# Patient Record
Sex: Male | Born: 1935 | Race: White | Hispanic: No | Marital: Married | Smoking: Current every day smoker
Health system: Southern US, Community
[De-identification: ages and names within clinical notes are randomized; demographics above are authoritative.]

## PROBLEM LIST (undated history)

## (undated) ENCOUNTER — Emergency Department (HOSPITAL_COMMUNITY): Admission: EM | Discharge status: Absent without leave | Payer: Medicare Other

## (undated) DIAGNOSIS — C259 Malignant neoplasm of pancreas, unspecified: Secondary | ICD-10-CM

## (undated) DIAGNOSIS — N400 Enlarged prostate without lower urinary tract symptoms: Secondary | ICD-10-CM

## (undated) DIAGNOSIS — C787 Secondary malignant neoplasm of liver and intrahepatic bile duct: Secondary | ICD-10-CM

## (undated) DIAGNOSIS — I10 Essential (primary) hypertension: Secondary | ICD-10-CM

## (undated) DIAGNOSIS — E785 Hyperlipidemia, unspecified: Secondary | ICD-10-CM

## (undated) DIAGNOSIS — C4490 Unspecified malignant neoplasm of skin, unspecified: Secondary | ICD-10-CM

## (undated) DIAGNOSIS — K219 Gastro-esophageal reflux disease without esophagitis: Secondary | ICD-10-CM

## (undated) DIAGNOSIS — E538 Deficiency of other specified B group vitamins: Secondary | ICD-10-CM

## (undated) DIAGNOSIS — E291 Testicular hypofunction: Secondary | ICD-10-CM

## (undated) HISTORY — DX: Benign prostatic hyperplasia without lower urinary tract symptoms: N40.0

## (undated) HISTORY — DX: Essential (primary) hypertension: I10

## (undated) HISTORY — DX: Gastro-esophageal reflux disease without esophagitis: K21.9

## (undated) HISTORY — PX: APPENDECTOMY: SHX54

## (undated) HISTORY — DX: Testicular hypofunction: E29.1

## (undated) HISTORY — DX: Unspecified malignant neoplasm of skin, unspecified: C44.90

## (undated) HISTORY — PX: EYE SURGERY: SHX253

## (undated) HISTORY — PX: TONSILLECTOMY: SHX5217

## (undated) HISTORY — DX: Hyperlipidemia, unspecified: E78.5

## (undated) HISTORY — PX: OTHER SURGICAL HISTORY: SHX169

## (undated) HISTORY — DX: Deficiency of other specified B group vitamins: E53.8

---

## 1998-08-06 ENCOUNTER — Encounter: Payer: Self-pay | Admitting: Internal Medicine

## 1998-08-06 LAB — CONVERTED CEMR LAB

## 1999-01-06 ENCOUNTER — Emergency Department (HOSPITAL_COMMUNITY): Admission: EM | Admit: 1999-01-06 | Discharge: 1999-01-06 | Payer: Self-pay

## 1999-01-14 ENCOUNTER — Emergency Department (HOSPITAL_COMMUNITY): Admission: EM | Admit: 1999-01-14 | Discharge: 1999-01-14 | Payer: Self-pay | Admitting: Internal Medicine

## 2000-11-15 ENCOUNTER — Encounter (INDEPENDENT_AMBULATORY_CARE_PROVIDER_SITE_OTHER): Payer: Self-pay | Admitting: Specialist

## 2000-11-15 ENCOUNTER — Ambulatory Visit (HOSPITAL_BASED_OUTPATIENT_CLINIC_OR_DEPARTMENT_OTHER): Admission: RE | Admit: 2000-11-15 | Discharge: 2000-11-15 | Payer: Self-pay | Admitting: Plastic Surgery

## 2002-02-07 ENCOUNTER — Emergency Department (HOSPITAL_COMMUNITY): Admission: EM | Admit: 2002-02-07 | Discharge: 2002-02-07 | Payer: Self-pay | Admitting: Emergency Medicine

## 2004-05-28 ENCOUNTER — Ambulatory Visit: Payer: Self-pay | Admitting: Internal Medicine

## 2004-06-04 ENCOUNTER — Ambulatory Visit: Payer: Self-pay | Admitting: Internal Medicine

## 2004-07-10 ENCOUNTER — Ambulatory Visit: Payer: Self-pay | Admitting: Internal Medicine

## 2004-08-19 ENCOUNTER — Ambulatory Visit: Payer: Self-pay | Admitting: Internal Medicine

## 2004-10-26 ENCOUNTER — Ambulatory Visit: Payer: Self-pay | Admitting: Internal Medicine

## 2005-05-05 ENCOUNTER — Ambulatory Visit: Payer: Self-pay | Admitting: Internal Medicine

## 2005-06-03 ENCOUNTER — Ambulatory Visit: Payer: Self-pay | Admitting: Internal Medicine

## 2005-06-08 ENCOUNTER — Ambulatory Visit: Payer: Self-pay | Admitting: Internal Medicine

## 2005-07-12 ENCOUNTER — Ambulatory Visit: Payer: Self-pay | Admitting: Internal Medicine

## 2005-12-02 ENCOUNTER — Ambulatory Visit: Payer: Self-pay | Admitting: Internal Medicine

## 2005-12-18 ENCOUNTER — Ambulatory Visit: Payer: Self-pay | Admitting: Family Medicine

## 2006-02-15 ENCOUNTER — Ambulatory Visit: Payer: Self-pay | Admitting: Internal Medicine

## 2006-03-11 ENCOUNTER — Ambulatory Visit: Payer: Self-pay | Admitting: Internal Medicine

## 2006-06-23 ENCOUNTER — Ambulatory Visit: Payer: Self-pay | Admitting: Internal Medicine

## 2006-07-28 ENCOUNTER — Ambulatory Visit: Payer: Self-pay | Admitting: Internal Medicine

## 2006-07-28 LAB — CONVERTED CEMR LAB
AST: 23 units/L (ref 0–37)
Bacteria, UA: NEGATIVE
Cholesterol: 163 mg/dL (ref 0–200)
HDL: 51.3 mg/dL (ref >39.0)
Ketones, ur: NEGATIVE mg/dL
LDL Cholesterol: 91 mg/dL (ref 0–99)
Total CHOL/HDL Ratio: 3.2
Total Protein, Urine: NEGATIVE mg/dL
Triglycerides: 106 mg/dL (ref 0–149)
VLDL: 21 mg/dL (ref 0–40)

## 2006-09-07 ENCOUNTER — Ambulatory Visit: Payer: Self-pay | Admitting: Internal Medicine

## 2006-12-26 ENCOUNTER — Encounter: Payer: Self-pay | Admitting: Internal Medicine

## 2006-12-26 DIAGNOSIS — E785 Hyperlipidemia, unspecified: Secondary | ICD-10-CM

## 2006-12-26 DIAGNOSIS — I10 Essential (primary) hypertension: Secondary | ICD-10-CM

## 2006-12-26 DIAGNOSIS — N400 Enlarged prostate without lower urinary tract symptoms: Secondary | ICD-10-CM

## 2007-01-19 ENCOUNTER — Ambulatory Visit: Payer: Self-pay | Admitting: Internal Medicine

## 2007-02-07 ENCOUNTER — Encounter: Payer: Self-pay | Admitting: Internal Medicine

## 2007-02-07 ENCOUNTER — Ambulatory Visit: Payer: Self-pay | Admitting: Internal Medicine

## 2007-02-07 DIAGNOSIS — R05 Cough: Secondary | ICD-10-CM

## 2007-05-16 ENCOUNTER — Ambulatory Visit: Payer: Self-pay | Admitting: Internal Medicine

## 2007-09-22 ENCOUNTER — Encounter: Payer: Self-pay | Admitting: Internal Medicine

## 2007-11-20 ENCOUNTER — Ambulatory Visit: Payer: Self-pay | Admitting: Internal Medicine

## 2007-12-20 ENCOUNTER — Ambulatory Visit: Payer: Self-pay | Admitting: Internal Medicine

## 2007-12-20 LAB — CONVERTED CEMR LAB
Albumin: 4.1 g/dL (ref 3.5–5.2)
BUN: 11 mg/dL (ref 6–23)
Bilirubin Urine: NEGATIVE
Calcium: 9.6 mg/dL (ref 8.4–10.5)
Chloride: 105 meq/L (ref 96–112)
Cholesterol: 279 mg/dL (ref 0–200)
Creatinine, Ser: 1 mg/dL (ref 0.4–1.5)
Direct LDL: 181.3 mg/dL
Eosinophils Relative: 3.4 % (ref 0.0–5.0)
GFR calc Af Amer: 95 mL/min
HCT: 42.2 % (ref 39.0–52.0)
HDL: 49.5 mg/dL (ref >39.0)
Ketones, ur: NEGATIVE mg/dL
Leukocytes, UA: NEGATIVE
Lymphocytes Relative: 22.1 % (ref 12.0–46.0)
Monocytes Relative: 9 % (ref 3.0–12.0)
Neutro Abs: 5.8 10*3/uL (ref 1.4–7.7)
PSA: 1.25 ng/mL (ref 0.10–4.00)
Platelets: 166 10*3/uL (ref 150–400)
RDW: 12.5 % (ref 11.5–14.6)
Sodium: 140 meq/L (ref 135–145)
TSH: 1.84 microintl units/mL (ref 0.35–5.50)
Total CHOL/HDL Ratio: 5.6
Triglycerides: 175 mg/dL — ABNORMAL HIGH (ref 0–149)
Urobilinogen, UA: 0.2 (ref 0.0–1.0)
WBC: 9 10*3/uL (ref 4.5–10.5)
pH: 6.5 (ref 5.0–8.0)

## 2007-12-21 ENCOUNTER — Ambulatory Visit: Payer: Self-pay | Admitting: Internal Medicine

## 2008-01-18 ENCOUNTER — Ambulatory Visit: Payer: Self-pay | Admitting: Internal Medicine

## 2008-02-23 ENCOUNTER — Ambulatory Visit: Payer: Self-pay | Admitting: Internal Medicine

## 2008-04-17 ENCOUNTER — Encounter: Payer: Self-pay | Admitting: Internal Medicine

## 2008-04-22 ENCOUNTER — Encounter: Payer: Self-pay | Admitting: Internal Medicine

## 2008-05-09 ENCOUNTER — Ambulatory Visit: Payer: Self-pay | Admitting: Internal Medicine

## 2008-05-09 DIAGNOSIS — J1089 Influenza due to other identified influenza virus with other manifestations: Secondary | ICD-10-CM | POA: Insufficient documentation

## 2008-05-09 LAB — CONVERTED CEMR LAB
Inflenza A Ag: NEGATIVE
Influenza B Ag: NEGATIVE

## 2008-05-13 ENCOUNTER — Telehealth: Payer: Self-pay | Admitting: Internal Medicine

## 2008-05-15 ENCOUNTER — Telehealth: Payer: Self-pay | Admitting: Internal Medicine

## 2008-05-20 ENCOUNTER — Ambulatory Visit: Payer: Self-pay | Admitting: Internal Medicine

## 2008-09-09 ENCOUNTER — Telehealth: Payer: Self-pay | Admitting: Internal Medicine

## 2008-09-10 ENCOUNTER — Telehealth: Payer: Self-pay | Admitting: Internal Medicine

## 2008-11-05 ENCOUNTER — Telehealth: Payer: Self-pay | Admitting: Internal Medicine

## 2008-12-24 ENCOUNTER — Ambulatory Visit: Payer: Self-pay | Admitting: Internal Medicine

## 2008-12-24 LAB — CONVERTED CEMR LAB
AST: 20 units/L (ref 0–37)
Cholesterol: 191 mg/dL (ref 0–200)
HDL: 42 mg/dL (ref >39.00)
LDL Cholesterol: 120 mg/dL — ABNORMAL HIGH (ref 0–99)
PSA: 1.5 ng/mL (ref 0.10–4.00)
Potassium: 4.1 meq/L (ref 3.5–5.1)
Sodium: 141 meq/L (ref 135–145)
TSH: 1.89 microintl units/mL (ref 0.35–5.50)
Triglycerides: 146 mg/dL (ref 0.0–149.0)
VLDL: 29.2 mg/dL (ref 0.0–40.0)

## 2009-01-23 ENCOUNTER — Telehealth: Payer: Self-pay | Admitting: Internal Medicine

## 2009-01-27 ENCOUNTER — Telehealth: Payer: Self-pay | Admitting: Internal Medicine

## 2009-01-27 ENCOUNTER — Ambulatory Visit: Payer: Self-pay | Admitting: Internal Medicine

## 2009-01-27 ENCOUNTER — Ambulatory Visit (HOSPITAL_COMMUNITY): Admission: RE | Admit: 2009-01-27 | Discharge: 2009-01-27 | Payer: Self-pay | Admitting: Internal Medicine

## 2009-03-04 ENCOUNTER — Ambulatory Visit: Payer: Self-pay | Admitting: Internal Medicine

## 2009-04-22 ENCOUNTER — Encounter: Payer: Self-pay | Admitting: Internal Medicine

## 2009-05-06 ENCOUNTER — Telehealth: Payer: Self-pay | Admitting: Internal Medicine

## 2009-07-25 ENCOUNTER — Encounter: Payer: Self-pay | Admitting: Internal Medicine

## 2009-09-03 ENCOUNTER — Telehealth: Payer: Self-pay | Admitting: Internal Medicine

## 2009-09-17 ENCOUNTER — Encounter: Payer: Self-pay | Admitting: Internal Medicine

## 2009-09-18 ENCOUNTER — Telehealth: Payer: Self-pay | Admitting: Internal Medicine

## 2009-10-13 ENCOUNTER — Ambulatory Visit: Payer: Self-pay | Admitting: Internal Medicine

## 2009-10-13 DIAGNOSIS — S6720XA Crushing injury of unspecified hand, initial encounter: Secondary | ICD-10-CM | POA: Insufficient documentation

## 2009-10-13 DIAGNOSIS — L02519 Cutaneous abscess of unspecified hand: Secondary | ICD-10-CM

## 2009-10-13 DIAGNOSIS — L03119 Cellulitis of unspecified part of limb: Secondary | ICD-10-CM

## 2009-11-14 ENCOUNTER — Telehealth: Payer: Self-pay | Admitting: Internal Medicine

## 2009-11-20 ENCOUNTER — Ambulatory Visit: Payer: Self-pay | Admitting: Internal Medicine

## 2009-11-20 DIAGNOSIS — E291 Testicular hypofunction: Secondary | ICD-10-CM

## 2009-11-20 DIAGNOSIS — G609 Hereditary and idiopathic neuropathy, unspecified: Secondary | ICD-10-CM | POA: Insufficient documentation

## 2009-11-20 LAB — CONVERTED CEMR LAB
Basophils Relative: 0.7 % (ref 0.0–3.0)
Bilirubin, Direct: 0.2 mg/dL (ref 0.0–0.3)
Direct LDL: 184.7 mg/dL
Eosinophils Absolute: 0.6 10*3/uL (ref 0.0–0.7)
Eosinophils Relative: 6.7 % — ABNORMAL HIGH (ref 0.0–5.0)
Folate: 19 ng/mL
HDL: 66.9 mg/dL (ref >39.00)
Hemoglobin: 15.2 g/dL (ref 13.0–17.0)
Lymphocytes Relative: 25.7 % (ref 12.0–46.0)
MCHC: 35.1 g/dL (ref 30.0–36.0)
Neutro Abs: 4.8 10*3/uL (ref 1.4–7.7)
RBC: 4.61 M/uL (ref 4.22–5.81)
Testosterone: 358.05 ng/dL (ref 350.00–890.00)
Total Bilirubin: 1.2 mg/dL (ref 0.3–1.2)
Total CHOL/HDL Ratio: 5
Triglycerides: 236 mg/dL — ABNORMAL HIGH (ref 0.0–149.0)
VLDL: 47.2 mg/dL — ABNORMAL HIGH (ref 0.0–40.0)
WBC: 8.3 10*3/uL (ref 4.5–10.5)

## 2009-11-21 ENCOUNTER — Telehealth: Payer: Self-pay | Admitting: Internal Medicine

## 2009-11-24 ENCOUNTER — Ambulatory Visit: Payer: Self-pay | Admitting: Internal Medicine

## 2009-11-24 DIAGNOSIS — E538 Deficiency of other specified B group vitamins: Secondary | ICD-10-CM | POA: Insufficient documentation

## 2009-12-19 ENCOUNTER — Ambulatory Visit: Payer: Self-pay | Admitting: Internal Medicine

## 2009-12-29 ENCOUNTER — Ambulatory Visit: Payer: Self-pay | Admitting: Internal Medicine

## 2009-12-29 LAB — CONVERTED CEMR LAB
ALT: 23 units/L (ref 0–53)
AST: 24 units/L (ref 0–37)
Calcium: 9.1 mg/dL (ref 8.4–10.5)
Cholesterol: 220 mg/dL — ABNORMAL HIGH (ref 0–200)
Direct LDL: 127.5 mg/dL
GFR calc non Af Amer: 72.61 mL/min (ref >60)
Glucose, Bld: 115 mg/dL — ABNORMAL HIGH (ref 70–99)
HDL: 52.6 mg/dL (ref >39.00)
Potassium: 3.8 meq/L (ref 3.5–5.1)
Sodium: 132 meq/L — ABNORMAL LOW (ref 135–145)
TSH: 1.78 microintl units/mL (ref 0.35–5.50)
Testosterone-% Free: 2.7 % (ref 1.6–2.9)
Testosterone: 1422.22 ng/dL — ABNORMAL HIGH (ref 350–890)
Total Protein: 7 g/dL (ref 6.0–8.3)
Triglycerides: 295 mg/dL — ABNORMAL HIGH (ref 0.0–149.0)
VLDL: 59 mg/dL — ABNORMAL HIGH (ref 0.0–40.0)

## 2009-12-30 ENCOUNTER — Ambulatory Visit: Payer: Self-pay | Admitting: Internal Medicine

## 2010-01-02 ENCOUNTER — Ambulatory Visit: Payer: Self-pay | Admitting: Internal Medicine

## 2010-01-07 ENCOUNTER — Telehealth: Payer: Self-pay | Admitting: Internal Medicine

## 2010-01-19 ENCOUNTER — Ambulatory Visit: Payer: Self-pay | Admitting: Internal Medicine

## 2010-01-29 ENCOUNTER — Encounter: Payer: Self-pay | Admitting: Internal Medicine

## 2010-02-02 ENCOUNTER — Ambulatory Visit: Payer: Self-pay | Admitting: Internal Medicine

## 2010-02-18 ENCOUNTER — Ambulatory Visit: Payer: Self-pay | Admitting: Internal Medicine

## 2010-03-06 ENCOUNTER — Ambulatory Visit: Payer: Self-pay | Admitting: Internal Medicine

## 2010-03-12 ENCOUNTER — Ambulatory Visit: Payer: Self-pay | Admitting: Internal Medicine

## 2010-04-06 ENCOUNTER — Ambulatory Visit: Payer: Self-pay | Admitting: Internal Medicine

## 2010-05-04 ENCOUNTER — Ambulatory Visit: Payer: Self-pay | Admitting: Internal Medicine

## 2010-05-25 ENCOUNTER — Telehealth: Payer: Self-pay | Admitting: Internal Medicine

## 2010-05-27 ENCOUNTER — Ambulatory Visit: Payer: Self-pay | Admitting: Internal Medicine

## 2010-05-27 ENCOUNTER — Telehealth: Payer: Self-pay | Admitting: Internal Medicine

## 2010-05-27 DIAGNOSIS — J069 Acute upper respiratory infection, unspecified: Secondary | ICD-10-CM | POA: Insufficient documentation

## 2010-05-28 ENCOUNTER — Telehealth: Payer: Self-pay | Admitting: Internal Medicine

## 2010-06-03 ENCOUNTER — Ambulatory Visit
Admission: RE | Admit: 2010-06-03 | Discharge: 2010-06-03 | Payer: Self-pay | Source: Home / Self Care | Attending: Internal Medicine | Admitting: Internal Medicine

## 2010-06-23 ENCOUNTER — Ambulatory Visit
Admission: RE | Admit: 2010-06-23 | Discharge: 2010-06-23 | Payer: Self-pay | Source: Home / Self Care | Attending: Internal Medicine | Admitting: Internal Medicine

## 2010-07-07 NOTE — Assessment & Plan Note (Signed)
Summary: B12 SHOT/MEN/CD   Nurse Visit   Allergies: No Known Drug Allergies  Medication Administration  Injection # 1:    Medication: Vit B12 1000 mcg    Diagnosis: VITAMIN B12 DEFICIENCY (ICD-266.2)    Route: IM    Site: R deltoid    Exp Date: 01/06/2012    Lot #: [id number]    Mfr: [employer]    Patient tolerated injection without complications    Given by: Jarome Lamas (April 06, 2010 1:25 PM)  Orders Added: 1)  Admin of Therapeutic Inj  intramuscular or subcutaneous [96372] 2)  Vit B12 1000 mcg [J3420]

## 2010-07-07 NOTE — Assessment & Plan Note (Signed)
Summary: PER PT 2 WK B12  MEN  STC  Nurse Visit   Allergies: No Known Drug Allergies  Medication Administration  Injection # 1:    Medication: Vit B12 1000 mcg    Diagnosis: VITAMIN B12 DEFICIENCY (ICD-266.2)    Route: IM    Site: R deltoid    Exp Date: 01/06/2012    Lot #: [id number]    Mfr: [employer]    Patient tolerated injection without complications    Given by: Lamar Sprinkles, CMA (February 02, 2010 2:22 PM)  Orders Added: 1)  Admin of Therapeutic Inj  intramuscular or subcutaneous [96372] 2)  Vit B12 1000 mcg [J3420]   Medication Administration  Injection # 1:    Medication: Vit B12 1000 mcg    Diagnosis: VITAMIN B12 DEFICIENCY (ICD-266.2)    Route: IM    Site: R deltoid    Exp Date: 01/06/2012    Lot #: [id number]    Mfr: [employer]    Patient tolerated injection without complications    Given by: Lamar Sprinkles, CMA (February 02, 2010 2:22 PM)  Orders Added: 1)  Admin of Therapeutic Inj  intramuscular or subcutaneous [96372] 2)  Vit B12 1000 mcg [J3420]

## 2010-07-07 NOTE — Assessment & Plan Note (Signed)
Summary: CPX - SD   Vital Signs:  Patient profile:   77 year old male Height:      68 inches Weight:      170 pounds BMI:     25.94 Temp:     98.3 degrees F oral Pulse rate:   60 / minute Pulse rhythm:   regular BP sitting:   138 / 88  (left arm)  Vitals Entered By: Bill Salinas, CMA (December 30, 2009 9:57 AM) CC: CPX   Primary Care Provider:  Money Mckeithan  CC:  CPX.  History of Present Illness: Patient presents for routine medical followup. since his last visit he has been walking and watching his diet. He has lost some weight and generally feels better. He has no active medical complaints at today's visit.   Allergies: No Known Drug Allergies  Past History:  Past Medical History: Last updated: 11/20/2007 Hypertension skin cancer hyperlipidemia  Past Surgical History: Last updated: 12/26/2006 Appendectomy Tonsillectomy  Family History: Last updated: 01-13-08 Father died at age 91 with CHF Mother died at age 80 A brother died at age 27 of heart disease The patient has 3 older sisters There is no family history for colon cancer Brother with terminal cancer  Social History: Last updated: 01-13-2008 Graduate of [employer] [place]  Married 47 years - stable marriage. Son age 56 living at home  He has 2 sons and 2 daughters Work: retired from Airline pilot with [employer]  Risk Factors: Smoking Status: current (10/13/2009) Cigars/wk: several a week (10/13/2009) Passive Smoke Exposure: no (10/13/2009)  Review of Systems  The patient denies anorexia, fever, weight loss, weight gain, vision loss, decreased hearing, hoarseness, chest pain, syncope, dyspnea on exertion, peripheral edema, prolonged cough, headaches, hemoptysis, abdominal pain, severe indigestion/heartburn, incontinence, suspicious skin lesions, difficulty walking, depression, unusual weight change, enlarged lymph nodes, angioedema, and testicular masses.    Physical Exam  General:  WNWD white male in no  distress Head:  normocephalic and atraumatic.   Eyes:  No corneal or conjunctival inflammation noted. EOMI. Perrla. Funduscopic exam benign, without hemorrhages, exudates or papilledema. Vision grossly normal. Ears:  R ear normal and L ear normal.   Nose:  no external deformity and no external erythema.   Mouth:  good dentition, no gingival abnormalities, no dental plaque, and pharynx pink and moist.   Neck:  supple, full ROM, no thyromegaly, no JVD, and no carotid bruits.   Chest Wall:  No deformities, masses, tenderness or gynecomastia noted. Lungs:  Normal respiratory effort, chest expands symmetrically. Lungs are clear to auscultation, no crackles or wheezes. Heart:  Normal rate and regular rhythm. S1 and S2 normal without gallop, murmur, click, rub or other extra sounds. Abdomen:  soft, non-tender, normal bowel sounds, no guarding, and no hepatomegaly.   Prostate:  deferred to GU Msk:  normal ROM, no joint tenderness, no joint swelling, no joint warmth, and no joint deformities.   Pulses:  2+ radial and DP pulses Extremities:  No clubbing, cyanosis, edema, or deformity noted with normal full range of motion of all joints.   Neurologic:  alert & oriented X3, cranial nerves II-XII intact, strength normal in all extremities, gait normal, and DTRs symmetrical and normal.   Skin:  turgor normal, color normal, no rashes, and no suspicious lesions.   Cervical Nodes:  no anterior cervical adenopathy and no posterior cervical adenopathy.   Psych:  Oriented X3, memory intact for recent and remote, normally interactive, and good eye contact.     Impression &  Recommendations:  Problem # 1:  VITAMIN B12 DEFICIENCY (ICD-266.2) ON replacement. B12 level is low normal range.  Plan - continue replacement  Problem # 2:  HYPOGONADISM (ICD-257.2) Receiving injection therapy. Testosterone level is supra-therapeutic  Problem # 3:  PERIPHERAL NEUROPATHY (ICD-356.9) Stable  Problem # 4:  HYPERTENSION  (ICD-401.9)  His updated medication list for this problem includes:    Toprol Xl 50 Mg Xr24h-tab (Metoprolol succinate) .Marland Kitchen... 1 by mouth once daily    Hydrochlorothiazide 25 Mg Tabs (Hydrochlorothiazide) .Marland Kitchen... Take 1/2 tablet by mouth once a day  BP today: 138/88 Prior BP: 160/82 (11/20/2009)  Labs Reviewed: K+: 3.8 (12/29/2009) Creat: : 1.1 (12/29/2009)     Good control. on present medication.  Problem # 5:  BENIGN PROSTATIC HYPERTROPHY, HX OF (ICD-V13.8) Per Dr. Annabell Howells  Problem # 6:  HYPERLIPIDEMIA (ICD-272.4)  His updated medication list for this problem includes:    Lovastatin 40 Mg Tabs (Lovastatin) .Marland Kitchen... Take 1 tab by mouth at bedtime    Zetia 10 Mg Tabs (Ezetimibe) ..... One once daily  Labs Reviewed: SGOT: 24 (12/29/2009)   SGPT: 23 (12/29/2009)   HDL:52.60 (12/29/2009), 66.90 (11/20/2009)  LDL:120 (12/24/2008), DEL (12/20/2007)  Chol:220 (12/29/2009), 305 (11/20/2009)  Trig:295.0 (12/29/2009), 236.0 (11/20/2009)  Adequate control on present medication.   Problem # 7:  Preventive Health Care (ICD-V70.0) Normal exam. Lab results except for triglycerides were normal. PSA was 2.0 up from 1.5 1 year ago = within normal accerlation guidelines.  Last colonoscopy '04. Immunizations are up to date: tetnus '09, pneumovax '09 and zostavax today.  He is not depressed, He is independent in ADLs. He has no increased fall or injury risk.   In summary - nice man who appears to be medically stable at this time. He will continue all his present meds, his exercise program and he is encourage to completely abstain from all nicotine.  He will return as needed or 6 months.   Complete Medication List: 1)  Baby Aspirin 81 Mg Chew (Aspirin) .... Once daily 2)  Lovastatin 40 Mg Tabs (Lovastatin) .... Take 1 tab by mouth at bedtime 3)  Toprol Xl 50 Mg Xr24h-tab (Metoprolol succinate) .Marland Kitchen.. 1 by mouth once daily 4)  Zetia 10 Mg Tabs (Ezetimibe) .... One once daily 5)  Viagra 50 Mg Tabs  (Sildenafil citrate) .... As needed 6)  Loratadine 10 Mg Tabs (Loratadine) .... Take 1 tablet by mouth once a day 7)  Kls Omperazole 20 Mg Tbec (Omeprazole) .Marland Kitchen.. 1 qam 8)  Anaprox Ds 550 Mg Tabs (Naproxen sodium) .Marland Kitchen.. 1 by mouth once daily 9)  Hydrochlorothiazide 25 Mg Tabs (Hydrochlorothiazide) .... Take 1/2 tablet by mouth once a day 10)  Finasteride 5 Mg Tabs (Finasteride) .... Take 1 tablet by mouth once a day 11)  Levitra 10 Mg Tabs (Vardenafil hcl) .Marland Kitchen.. 1 by mouth as needed 12)  Amitriptyline Hcl 25 Mg Tabs (Amitriptyline hcl) .Marland Kitchen.. 1 by mouth at bedtime  Other Orders: Subsequent annual wellness visit with prevention plan [phone]) Zoster (Shingles) Vaccine Live [phone]) Admin 1st Vaccine (09811)  Patient: Austin Torres Note: All result statuses are Final unless otherwise noted.  Tests: (1) Lipid Panel (LIPID)   Cholesterol          [H]  220 mg/dL                   9-147     ATP III Classification            Desirable:  < 200 mg/dL  Borderline High:  200 - 239 mg/dL               High:  > = 240 mg/dL   Triglycerides        [H]  295.0 mg/dL                 4.2-595.6     Normal:  <150 mg/dL     Borderline High:  387 - 199 mg/dL   HDL                       56.43 mg/dL                 >32.95   VLDL Cholesterol     [H]  59.0 mg/dL                  1.8-84.1  CHO/HDL Ratio:  CHD Risk                             4                    Men          Women     1/2 Average Risk     3.4          3.3     Average Risk          5.0          4.4     2X Average Risk          9.6          7.1     3X Average Risk          15.0          11.0                           Tests: (2) B12 Serum - Total ONLY (B12)   Vitamin B12               232 pg/mL                   211-911  Tests: (3) Hepatic/Liver Function Panel (HEPATIC)   Total Bilirubin      [H]  1.5 mg/dL                   6.6-0.6   Direct Bilirubin          0.2 mg/dL                   3.0-1.6   Alkaline Phosphatase      49  U/L                      39-117   AST                       24 U/L                      0-37   ALT                       23 U/L                      0-53   Total Protein  7.0 g/dL                    1.6-1.0   Albumin                   4.4 g/dL                    9.6-0.4  Tests: (4) BMP (METABOL)   Sodium               [L]  132 mEq/L                   135-145   Potassium                 3.8 mEq/L                   3.5-5.1   Chloride                  96 mEq/L                    96-112   Carbon Dioxide            27 mEq/L                    19-32   Glucose              [H]  115 mg/dL                   54-09   BUN                       22 mg/dL                    8-11   Creatinine                1.1 mg/dL                   9.1-4.7   Calcium                   9.1 mg/dL                   8.2-95.6   GFR                       72.61 mL/min                >60  Tests: (5) TSH (TSH)   FastTSH                   1.78 uIU/mL                 0.35-5.50  Tests: (6) Prostate Specific Antigen (PSA)   PSA-Hyb                   2.00 ng/mL                  0.10-4.00  Tests: (7) Cholesterol LDL - Direct (DIRLDL)  Cholesterol LDL - Direct                             127.5 mg/dL  Tests: (1) Testosterone, Free and Total (includes SHBG) (3230)   Testosterone, Total  [H]  1422.22 ng/dL  25-890                Tanner Stage       Male              Male              I              < 30 ng/dL        < 10 ng/dL              II             < 150 ng/dL       < 30 ng/dL              III            100-320 ng/dL     < 35 ng/dL              IV             200-970 ng/dL     65-78 ng/dL              V              350-890 ng/dL     46-96 ng/dL         Sex Hormone Binding Globulin                             32 nmol/L                   13-71   Testosterone, Free   [H]  382.1 pg/mL                 47.0-244.0  Immunizations Administered:  Zostavax # 1:    Vaccine Type: Zostavax    Site:  left arm    Mfr: Merck    Dose: 0.5 ml    Route: [place]    Given by: Ami Bullins CMA    Exp. Date: 11/14/2010    Lot #: [id number]    VIS given: 03/19/05 given December 30, 2009.

## 2010-07-07 NOTE — Assessment & Plan Note (Signed)
Summary: [name] Austin Torres  Nurse Visit   Allergies: No Known Drug Allergies  Medication Administration  Injection # 1:    Medication: Vit B12 1000 mcg    Diagnosis: VITAMIN B12 DEFICIENCY (ICD-266.2)    Route: IM    Site: R deltoid    Exp Date: 11/06/2011    Lot #: [id number]    Mfr: [employer] Regent    Patient tolerated injection without complications    Given by: Lamar Sprinkles, CMA (March 06, 2010 9:49 AM)  Injection # 2:    Medication: Testosterone Cypionat 200mg  ing    Diagnosis: HYPOGONADISM (ICD-257.2)    Route: IM    Site: RUOQ gluteus    Exp Date: 02/06/2011    Lot #: [id number]    Patient tolerated injection without complications    Given by: Lamar Sprinkles, CMA (March 06, 2010 9:51 AM)  Orders Added: 1)  Vit B12 1000 mcg [J3420] 2)  Admin of Therapeutic Inj  intramuscular or subcutaneous [96372] 3)  Admin of Therapeutic Inj  intramuscular or subcutaneous [62130]

## 2010-07-07 NOTE — Assessment & Plan Note (Signed)
Summary: dog bite/right hand/norins-lb   Vital Signs:  Patient profile:   77 year old male Height:      68 inches Weight:      176.75 pounds BMI:     26.97 O2 Sat:      96 % on Room air Temp:     98.6 degrees F oral Pulse rate:   61 / minute Pulse rhythm:   regular Resp:     16 per minute BP sitting:   142 / 78  (left arm) Cuff size:   large  Vitals Entered By: Rock Nephew CMA (Oct 13, 2009 10:47 AM)  Nutrition Counseling: Patient's BMI is greater than 25 and therefore counseled on weight management options.  O2 Flow:  Room air CC: Dog bite on R hand w/ swelling   Primary Care Provider:  norins  CC:  Dog bite on R hand w/ swelling.  History of Present Illness: New to me c/o dog bite on right hand 2 days ago. He was sitting at a restaurant when a freind's fully-vaccinated puppy was playing and it nipped his right hand. He has a painful PW on the dorsum of his hand over the proximal aspect pf the 2nd metacarpal and an adjacent abrasion. He does not have any swelling or streaking.  Preventive Screening-Counseling & Management  Alcohol-Tobacco     Alcohol drinks/day: 2     Alcohol type: wine     Smoking Status: current     Smoking Cessation Counseling: yes     Cigars/week: several a week     Passive Smoke Exposure: no  Hep-HIV-STD-Contraception     Hepatitis Risk: no risk noted     HIV Risk: no     STD Risk: no risk noted      Sexual History:  currently monogamous.        Drug Use:  never.        Blood Transfusions:  no.    Medications Prior to Update: 1)  Baby Aspirin 81 Mg  Chew (Aspirin) .... Once Daily 2)  Lovastatin 40 Mg  Tabs (Lovastatin) .... Take 1 Tab By Mouth At Bedtime 3)  Toprol Xl 50 Mg  Xr24h-Tab (Metoprolol Succinate) .Marland Kitchen.. 1 By Mouth Once Daily 4)  Zetia 10 Mg  Tabs (Ezetimibe) .... One Once Daily 5)  Viagra 50 Mg  Tabs (Sildenafil Citrate) .... As Needed 6)  Loratadine 10 Mg  Tabs (Loratadine) .... Take 1 Tablet By Mouth Once A Day 7)  Kls  Omperazole 20 Mg  Tbec (Omeprazole) .Marland Kitchen.. 1 Qam 8)  Anaprox Ds 550 Mg  Tabs (Naproxen Sodium) .Marland Kitchen.. 1 By Mouth Once Daily 9)  Hydrochlorothiazide 25 Mg  Tabs (Hydrochlorothiazide) .... Take 1/2 Tablet By Mouth Once A Day 10)  Finasteride 5 Mg  Tabs (Finasteride) .... Take 1 Tablet By Mouth Once A Day 11)  Lomotil 2.5-0.025 Mg Tabs (Diphenoxylate-Atropine) .Marland Kitchen.. 1 After Each Loose Stool, Limit of 4 Doses/24hrs. 12)  Promethazine Hcl 12.5 Mg Tabs (Promethazine Hcl) .Marland Kitchen.. 1 Every Six Hours As Needed For Nausea. 13)  Levitra 10 Mg Tabs (Vardenafil Hcl) .Marland Kitchen.. 1 By Mouth As Needed 14)  Dicyclomine Hcl 10 Mg Caps (Dicyclomine Hcl) .Marland Kitchen.. 1 By Mouth Q 6 As Needed Bloating,gas and Discomfort  Current Medications (verified): 1)  Baby Aspirin 81 Mg  Chew (Aspirin) .... Once Daily 2)  Lovastatin 40 Mg  Tabs (Lovastatin) .... Take 1 Tab By Mouth At Bedtime 3)  Toprol Xl 50 Mg  Xr24h-Tab (Metoprolol Succinate) .Marland KitchenMarland KitchenMarland Kitchen  1 By Mouth Once Daily 4)  Zetia 10 Mg  Tabs (Ezetimibe) .... One Once Daily 5)  Viagra 50 Mg  Tabs (Sildenafil Citrate) .... As Needed 6)  Loratadine 10 Mg  Tabs (Loratadine) .... Take 1 Tablet By Mouth Once A Day 7)  Kls Omperazole 20 Mg  Tbec (Omeprazole) .Marland Kitchen.. 1 Qam 8)  Anaprox Ds 550 Mg  Tabs (Naproxen Sodium) .Marland Kitchen.. 1 By Mouth Once Daily 9)  Hydrochlorothiazide 25 Mg  Tabs (Hydrochlorothiazide) .... Take 1/2 Tablet By Mouth Once A Day 10)  Finasteride 5 Mg  Tabs (Finasteride) .... Take 1 Tablet By Mouth Once A Day 11)  Lomotil 2.5-0.025 Mg Tabs (Diphenoxylate-Atropine) .Marland Kitchen.. 1 After Each Loose Stool, Limit of 4 Doses/24hrs. 12)  Promethazine Hcl 12.5 Mg Tabs (Promethazine Hcl) .Marland Kitchen.. 1 Every Six Hours As Needed For Nausea. 13)  Levitra 10 Mg Tabs (Vardenafil Hcl) .Marland Kitchen.. 1 By Mouth As Needed 14)  Dicyclomine Hcl 10 Mg Caps (Dicyclomine Hcl) .Marland Kitchen.. 1 By Mouth Q 6 As Needed Bloating,gas and Discomfort 15)  Augmentin 500-125 Mg Tab (Amoxicillin-Pot Clavulanate) .... Take 1 Capsule By Mouth Three Times A Day X  10 Days  Allergies (verified): No Known Drug Allergies  Past History:  Past Medical History: Reviewed history from 11/20/2007 and no changes required. Hypertension skin cancer hyperlipidemia  Past Surgical History: Reviewed history from 12/26/2006 and no changes required. Appendectomy Tonsillectomy  Family History: Reviewed history from 12/21/2007 and no changes required. Father died at age 75 with CHF Mother died at age 34 A brother died at age 9 of heart disease The patient has 3 older sisters There is no family history for colon cancer Brother with terminal cancer  Social History: Reviewed history from 12/21/2007 and no changes required. Graduate of [employer] [place]  Married 47 years - stable marriage. 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   General:  Denies chills, fatigue, fever, malaise, sleep disorder, and sweats. MS:  Complains of joint pain and joint redness; denies joint swelling, loss of strength, mid back pain, and muscle aches.  Physical Exam  General:  alert, well-developed, well-nourished, well-hydrated, appropriate dress, normal appearance, healthy-appearing, and cooperative to examination.   Neck:  supple, full ROM, and no masses.   Lungs:  Normal respiratory effort, chest expands symmetrically. Lungs are clear to auscultation, no crackles or wheezes. Heart:  Normal rate and regular rhythm. S1 and S2 normal without gallop, murmur, click, rub or other extra sounds. Abdomen:  Bowel sounds positive,abdomen soft and non-tender without masses, organomegaly or hernias noted. Msk:  over the dorsum of the right hand there  is a small PW over the dorsum of the proximal 2nd metacarpal with VERY minimal ttp but no warmth, streaking, induration, fluctuance, exudate, FB, or boney derangement. Lateral to this there is a superficial, uncompliacted abrasion.  Pulses:  R radial normal.   Extremities:  No clubbing, cyanosis, edema, or deformity noted with normal full range of motion of all joints.   Cervical Nodes:  no anterior cervical adenopathy and no posterior cervical adenopathy.   Axillary Nodes:  no R axillary adenopathy and no L axillary adenopathy.     Complete  Medication List: 1)  Baby Aspirin 81 Mg Chew (Aspirin) .... Once daily 2)  Lovastatin 40 Mg Tabs (Lovastatin) .... Take 1 tab by mouth at bedtime 3)  Toprol Xl 50 Mg Xr24h-tab (Metoprolol succinate) .Marland Kitchen.. 1 by mouth once daily 4)  Zetia 10 Mg Tabs (Ezetimibe) .... One once daily 5)  Viagra 50 Mg Tabs (Sildenafil citrate) .... As needed 6)  Loratadine 10 Mg Tabs (Loratadine) .... Take 1 tablet by mouth once a day 7)  Kls Omperazole 20 Mg Tbec (Omeprazole) .Marland Kitchen.. 1 qam 8)  Anaprox Ds 550 Mg Tabs (Naproxen sodium) .Marland Kitchen.. 1 by mouth once daily 9)  Hydrochlorothiazide 25 Mg Tabs (Hydrochlorothiazide) .... Take 1/2 tablet by mouth once a day 10)  Finasteride 5 Mg Tabs (Finasteride) .... Take 1 tablet by mouth once a day 11)  Lomotil 2.5-0.025 Mg Tabs (Diphenoxylate-atropine) .Marland Kitchen.. 1 after each loose stool, limit of 4 doses/24hrs. 12)  Promethazine Hcl 12.5 Mg Tabs (Promethazine hcl) .Marland Kitchen.. 1 every six hours as needed for nausea. 13)  Levitra 10 Mg Tabs (Vardenafil hcl) .Marland Kitchen.. 1 by mouth as needed 14)  Dicyclomine Hcl 10 Mg Caps (Dicyclomine hcl) .Marland Kitchen.. 1 by mouth q 6 as needed bloating,gas and discomfort 15)  Augmentin 500-125 Mg Tab (Amoxicillin-pot clavulanate) .... Take 1 capsule by mouth three times a day x 10 days  Other Orders: T-Hand Right 3 views (73130TC) TD Toxoids IM 7 YR + (04540) Admin 1st Vaccine (98119)  Patient Instructions: 1)  Please schedule a  follow-up appointment in 2 weeks. 2)  Take your antibiotic as prescribed until ALL of it is gone, but stop if you develop a rash or swelling and contact our office as soon as possible. 3)  You may move around but avoid painful motions. Apply ice to sore area for 20 minutes 3-4 times a day for 2-3 days. Prescriptions: AUGMENTIN 500-125 MG TAB (AMOXICILLIN-POT CLAVULANATE) Take 1 capsule by mouth three times a day X 10 days  #30 x 1   Entered and Authorized by:   Etta Grandchild MD   Signed by:   Etta Grandchild MD on 10/13/2009   Method used:   Electronically to        [facility]* (retail)       [address] [place], [place]  [address]       Ph: [phone] or [phone]       Fax: [phone]   RxID:   [id number]    Immunizations Administered:  Tetanus Vaccine:    Vaccine Type: Td    Site: left deltoid    Mfr: Sanofi Pasteur    Dose: 0.5 ml    Route: IM    Given by: Rock Nephew CMA    Exp. Date: 06/20/2011    Lot #: [id number]    VIS given: 04/25/07 version given Oct 13, 2009.

## 2010-07-07 NOTE — Assessment & Plan Note (Signed)
Summary: B12 SHOT/MEN/CD - COMING AT 11AM  Nurse Visit   Allergies: No Known Drug Allergies  Medication Administration  Injection # 1:    Medication: Vit B12 1000 mcg    Diagnosis: VITAMIN B12 DEFICIENCY (ICD-266.2)    Route: IM    Site: L deltoid    Exp Date: 10/06/2011    Lot #: [id number]    Mfr: [employer]    Patient tolerated injection without complications    Given by: Lamar Sprinkles, CMA (January 19, 2010 10:54 AM)  Orders Added: 1)  Vit B12 1000 mcg [J3420] 2)  Admin of Therapeutic Inj  intramuscular or subcutaneous [96372]   Medication Administration  Injection # 1:    Medication: Vit B12 1000 mcg    Diagnosis: VITAMIN B12 DEFICIENCY (ICD-266.2)    Route: IM    Site: L deltoid    Exp Date: 10/06/2011    Lot #: [id number]    Mfr: [employer]    Patient tolerated injection without complications    Given by: Lamar Sprinkles, CMA (January 19, 2010 10:54 AM)  Orders Added: 1)  Vit B12 1000 mcg [J3420] 2)  Admin of Therapeutic Inj  intramuscular or subcutaneous [19147]

## 2010-07-07 NOTE — Assessment & Plan Note (Signed)
Summary: ??snackbite upper arm/cd   Vital Signs:  Patient profile:   77 year old male Height:      68 inches Weight:      174 pounds BMI:     26.55 O2 Sat:      96 % on Room air Temp:     98.5 degrees F oral Pulse rate:   77 / minute BP sitting:   154 / 84  (right arm) Cuff size:   regular  Vitals Entered By: Bill Salinas CMA (March 12, 2010 11:47 AM)  O2 Flow:  Room air CC: ov for evaluation of ? on snake bite on his left arm/ ab   Primary Care Jep Dyas:  norins  CC:  ov for evaluation of ? on snake bite on his left arm/ ab.  History of Present Illness: Patient presents acutely for an envenomation injury left UE. He reports that he was doing yard work yesterday and notice a stinging in his proximal left UE. Soon thereafter he developed erythema and muscle soreness and an area of a visible bite. Today he was talking with his pharmacist who advised medical evaluation.   He has spreading erythema of the arm, muscle soreness. He denies local diaphoresis, drainage, severe pain, fever, abdominal pain or cramps, headach or blurred vision.  Current Medications (verified): 1)  Baby Aspirin 81 Mg  Chew (Aspirin) .... Once Daily 2)  Lovastatin 40 Mg  Tabs (Lovastatin) .... Take 1 Tab By Mouth At Bedtime 3)  Toprol Xl 50 Mg  Xr24h-Tab (Metoprolol Succinate) .Marland Kitchen.. 1 By Mouth Once Daily 4)  Zetia 10 Mg  Tabs (Ezetimibe) .... One Once Daily 5)  Viagra 50 Mg  Tabs (Sildenafil Citrate) .... As Needed 6)  Loratadine 10 Mg  Tabs (Loratadine) .... Take 1 Tablet By Mouth Once A Day 7)  Kls Omperazole 20 Mg  Tbec (Omeprazole) .Marland Kitchen.. 1 Qam 8)  Anaprox Ds 550 Mg  Tabs (Naproxen Sodium) .Marland Kitchen.. 1 By Mouth Once Daily 9)  Hydrochlorothiazide 25 Mg  Tabs (Hydrochlorothiazide) .... Take 1/2 Tablet By Mouth Once A Day 10)  Finasteride 5 Mg  Tabs (Finasteride) .... Take 1 Tablet By Mouth Once A Day 11)  Levitra 10 Mg Tabs (Vardenafil Hcl) .Marland Kitchen.. 1 By Mouth As Needed 12)  Amitriptyline Hcl 25 Mg Tabs  (Amitriptyline Hcl) .Marland Kitchen.. 1 By Mouth At Bedtime 13)  Testosterone Cypionate 200 Mg/ml Oil (Testosterone Cypionate) .... As Directed By Urologist  Allergies (verified): No Known Drug Allergies PMH-FH-SH reviewed-no changes except otherwise noted  Review of Systems  The patient denies fever, weight loss, chest pain, peripheral edema, abdominal pain, incontinence, difficulty walking, and enlarged lymph nodes.    Physical Exam  General:  Well-developed,well-nourished,in no acute distress; alert,appropriate and cooperative throughout examination Head:  normocephalic and atraumatic.   Eyes:  C&S clear Lungs:  normal respiratory effort and normal breath sounds.   Heart:  normal rate and regular rhythm.   Skin:  at proximal left UE 2 cm round area with what appears to be puncture sites. Surrounding erythema that extends to the forearm. Tenderness of the surrounding muscle tissue.    Impression & Recommendations:  Problem # 1:  TOXIC EFFECT OF VENOM (ICD-989.5) Patient with envenomation. Given location and circumstance most likely a spider bite. He does not have symptoms or signs of serious black widow spider envenomation. there is extending erythema raising concern for a cellulitis.  Plan close observation        doxycycline 100mg  two times a day  x 7  Complete Medication List: 1)  Baby Aspirin 81 Mg Chew (Aspirin) .... Once daily 2)  Lovastatin 40 Mg Tabs (Lovastatin) .... Take 1 tab by mouth at bedtime 3)  Toprol Xl 50 Mg Xr24h-tab (Metoprolol succinate) .Marland Kitchen.. 1 by mouth once daily 4)  Zetia 10 Mg Tabs (Ezetimibe) .... One once daily 5)  Viagra 50 Mg Tabs (Sildenafil citrate) .... As needed 6)  Loratadine 10 Mg Tabs (Loratadine) .... Take 1 tablet by mouth once a day 7)  Kls Omperazole 20 Mg Tbec (Omeprazole) .Marland Kitchen.. 1 qam 8)  Anaprox Ds 550 Mg Tabs (Naproxen sodium) .Marland Kitchen.. 1 by mouth once daily 9)  Hydrochlorothiazide 25 Mg Tabs (Hydrochlorothiazide) .... Take 1/2 tablet by mouth once a  day 10)  Finasteride 5 Mg Tabs (Finasteride) .... Take 1 tablet by mouth once a day 11)  Levitra 10 Mg Tabs (Vardenafil hcl) .Marland Kitchen.. 1 by mouth as needed 12)  Amitriptyline Hcl 25 Mg Tabs (Amitriptyline hcl) .Marland Kitchen.. 1 by mouth at bedtime 13)  Testosterone Cypionate 200 Mg/ml Oil (Testosterone cypionate) .... As directed by urologist 14)  Doxycycline Hyclate 100 Mg Caps (Doxycycline hyclate) .Marland Kitchen.. 1 by mouth two times a day x 7 for skin infection

## 2010-07-07 NOTE — Assessment & Plan Note (Signed)
Summary: right foot,under big toe inflammed/#/cd   Vital Signs:  Patient profile:   77 year old male Height:      68 inches Weight:      172 pounds BMI:     26.25 O2 Sat:      97 % on Room air Temp:     97.5 degrees F oral Pulse rate:   64 / minute BP sitting:   160 / 82  (left arm) Cuff size:   regular  Vitals Entered By: Bill Salinas CMA (November 20, 2009 11:30 AM)  O2 Flow:  Room air CC: pt here for evaluation of irratation around the balls of his feet/ ab   Primary Care Provider:  norins  CC:  pt here for evaluation of irratation around the balls of his feet/ ab.  History of Present Illness: patient c/o itching on the ball of his feet that is worse at night but will bother him during the day as well. He has not had any flare of joint pain (despite the concern of his pharmacist for early gout). He is otherwise doing ok.   Current Medications (verified): 1)  Baby Aspirin 81 Mg  Chew (Aspirin) .... Once Daily 2)  Lovastatin 40 Mg  Tabs (Lovastatin) .... Take 1 Tab By Mouth At Bedtime 3)  Toprol Xl 50 Mg  Xr24h-Tab (Metoprolol Succinate) .Marland Kitchen.. 1 By Mouth Once Daily 4)  Zetia 10 Mg  Tabs (Ezetimibe) .... One Once Daily 5)  Viagra 50 Mg  Tabs (Sildenafil Citrate) .... As Needed 6)  Loratadine 10 Mg  Tabs (Loratadine) .... Take 1 Tablet By Mouth Once A Day 7)  Kls Omperazole 20 Mg  Tbec (Omeprazole) .Marland Kitchen.. 1 Qam 8)  Anaprox Ds 550 Mg  Tabs (Naproxen Sodium) .Marland Kitchen.. 1 By Mouth Once Daily 9)  Hydrochlorothiazide 25 Mg  Tabs (Hydrochlorothiazide) .... Take 1/2 Tablet By Mouth Once A Day 10)  Finasteride 5 Mg  Tabs (Finasteride) .... Take 1 Tablet By Mouth Once A Day 11)  Levitra 10 Mg Tabs (Vardenafil Hcl) .Marland Kitchen.. 1 By Mouth As Needed  Allergies (verified): No Known Drug Allergies PMH-FH-SH reviewed-no changes except otherwise noted  Review of Systems  The patient denies anorexia, fever, weight loss, weight gain, decreased hearing, chest pain, syncope, dyspnea on exertion, prolonged  cough, hemoptysis, hematochezia, hematuria, genital sores, muscle weakness, difficulty walking, unusual weight change, and angioedema.    Physical Exam  General:  WNWD white male in no distress Lungs:  normal respiratory effort.   Heart:  normal rate and regular rhythm.   Pulses:  2+ DP and PT pulses, good capillary refill Neurologic:  normal sharp dull discrimination. Mild decreased deep vibratory sensation.  Skin:  no lesions on soles or in the intertriginous areas between toes.   Impression & Recommendations:  Problem # 1:  PERIPHERAL NEUROPATHY (ICD-356.9) Itching may be a manifestation of neuropathy. He has good circulation.  Plan - r/o metabolic causes of neuropathy           trial of elavil 69m g at bedtime.   Orders: TLB-B12 + Folate Pnl (21308_65784-O96/EXB) TLB-TSH (Thyroid Stimulating Hormone) (84443-TSH)  B12 is low - plan B12 replacement  Complete Medication List: 1)  Baby Aspirin 81 Mg Chew (Aspirin) .... Once daily 2)  Lovastatin 40 Mg Tabs (Lovastatin) .... Take 1 tab by mouth at bedtime 3)  Toprol Xl 50 Mg Xr24h-tab (Metoprolol succinate) .Marland Kitchen.. 1 by mouth once daily 4)  Zetia 10 Mg Tabs (Ezetimibe) .... One once daily 5)  Viagra 50 Mg Tabs (Sildenafil citrate) .... As needed 6)  Loratadine 10 Mg Tabs (Loratadine) .... Take 1 tablet by mouth once a day 7)  Kls Omperazole 20 Mg Tbec (Omeprazole) .Marland Kitchen.. 1 qam 8)  Anaprox Ds 550 Mg Tabs (Naproxen sodium) .Marland Kitchen.. 1 by mouth once daily 9)  Hydrochlorothiazide 25 Mg Tabs (Hydrochlorothiazide) .... Take 1/2 tablet by mouth once a day 10)  Finasteride 5 Mg Tabs (Finasteride) .... Take 1 tablet by mouth once a day 11)  Levitra 10 Mg Tabs (Vardenafil hcl) .Marland Kitchen.. 1 by mouth as needed 12)  Amitriptyline Hcl 25 Mg Tabs (Amitriptyline hcl) .Marland Kitchen.. 1 by mouth at bedtime  Other Orders: TLB-CBC Platelet - w/Differential (85025-CBCD) TLB-Lipid Panel (80061-LIPID) TLB-Hepatic/Liver Function Pnl (80076-HEPATIC) TLB-Testosterone, Total  (84403-TESTO) Prescriptions: AMITRIPTYLINE HCL 25 MG TABS (AMITRIPTYLINE HCL) 1 by mouth at bedtime  #30 x 12   Entered and Authorized by:   Jacques Navy MD   Signed by:   Jacques Navy MD on 11/20/2009   Method used:   Electronically to        [facility]* (retail)       [address] [place], [place]  [address]       Ph: [phone] or [phone]       Fax: [phone]   RxID:   [id number]

## 2010-07-07 NOTE — Letter (Signed)
Summary: [facility]  [facility]   Imported By: Sherian Rein 07/31/2009 10:37:29  _____________________________________________________________________  External Attachment:    Type:   Image     Comment:   External Document

## 2010-07-07 NOTE — Assessment & Plan Note (Signed)
Summary: B-12 Sherlean Foot Natale Milch  Nurse Visit   Allergies: No Known Drug Allergies  Medication Administration  Injection # 1:    Medication: Vit B12 1000 mcg    Diagnosis: VITAMIN B12 DEFICIENCY (ICD-266.2)    Route: IM    Site: L deltoid    Exp Date: 06/2011    Lot #: [id number]    Mfr: [employer]    Patient tolerated injection without complications    Given by: Rock Nephew CMA (November 24, 2009 8:50 AM)  Orders Added: 1)  Admin of Therapeutic Inj  intramuscular or subcutaneous [96372] 2)  Vit B12 1000 mcg [J3420]   Medication Administration  Injection # 1:    Medication: Vit B12 1000 mcg    Diagnosis: VITAMIN B12 DEFICIENCY (ICD-266.2)    Route: IM    Site: L deltoid    Exp Date: 06/2011    Lot #: [id number]    Mfr: [employer]    Patient tolerated injection without complications    Given by: Rock Nephew CMA (November 24, 2009 8:50 AM)  Orders Added: 1)  Admin of Therapeutic Inj  intramuscular or subcutaneous [96372] 2)  Vit B12 1000 mcg [J3420]

## 2010-07-07 NOTE — Assessment & Plan Note (Signed)
Summary: B12 / MEN /NWS  Nurse Visit   Allergies: No Known Drug Allergies  Medication Administration  Injection # 1:    Medication: Vit B12 1000 mcg    Diagnosis: VITAMIN B12 DEFICIENCY (ICD-266.2)    Route: IM    Site: L deltoid    Exp Date: 01/06/2012    Lot #: [id number]    Mfr: [employer]    Patient tolerated injection without complications    Given by: Lamar Sprinkles, CMA (May 04, 2010 2:48 PM)  Orders Added: 1)  Vit B12 1000 mcg [J3420] 2)  Admin of Therapeutic Inj  intramuscular or subcutaneous [96372]   Medication Administration  Injection # 1:    Medication: Vit B12 1000 mcg    Diagnosis: VITAMIN B12 DEFICIENCY (ICD-266.2)    Route: IM    Site: L deltoid    Exp Date: 01/06/2012    Lot #: [id number]    Mfr: [employer]    Patient tolerated injection without complications    Given by: Lamar Sprinkles, CMA (May 04, 2010 2:48 PM)  Orders Added: 1)  Vit B12 1000 mcg [J3420] 2)  Admin of Therapeutic Inj  intramuscular or subcutaneous [[address]]

## 2010-07-07 NOTE — Assessment & Plan Note (Signed)
Summary: b12 shot/men/cd  Nurse Visit   Allergies: No Known Drug Allergies  Medication Administration  Injection # 1:    Medication: Vit B12 1000 mcg    Diagnosis: VITAMIN B12 DEFICIENCY (ICD-266.2)    Route: IM    Site: R deltoid    Exp Date: 09/2011    Lot #: [id number]    Mfr: [employer]    Patient tolerated injection without complications    Given by: Ami Bullins CMA (January 02, 2010 10:10 AM)  Orders Added: 1)  Admin of Therapeutic Inj  intramuscular or subcutaneous [96372] 2)  Vit B12 1000 mcg [J3420]

## 2010-07-07 NOTE — Assessment & Plan Note (Signed)
Summary: B12 / MEN /NWS  Nurse Visit   Allergies: No Known Drug Allergies  Medication Administration  Injection # 1:    Medication: Vit B12 1000 mcg    Diagnosis: VITAMIN B12 DEFICIENCY (ICD-266.2)    Route: IM    Site: L deltoid    Exp Date: 09/2011    Lot #: [id number]    Mfr: [employer]    Patient tolerated injection without complications    Given by: Ami Bullins CMA (December 19, 2009 8:33 AM)  Orders Added: 1)  Admin of Therapeutic Inj  intramuscular or subcutaneous [96372] 2)  Vit B12 1000 mcg [J3420]

## 2010-07-07 NOTE — Letter (Signed)
Summary: [facility]   Imported By: Sherian Rein 02/06/2010 11:49:44  _____________________________________________________________________  External Attachment:    Type:   Image     Comment:   External Document

## 2010-07-07 NOTE — Progress Notes (Signed)
  Phone Note Refill Request Message from:  Fax from Pharmacy on September 03, 2009 2:09 PM  Refills Requested: Medication #1:  HYDROCHLOROTHIAZIDE 25 MG  TABS Take 1/2 tablet by mouth once a day Initial call taken by: Ami Bullins CMA,  September 03, 2009 2:09 PM    Prescriptions: HYDROCHLOROTHIAZIDE 25 MG  TABS (HYDROCHLOROTHIAZIDE) Take 1/2 tablet by mouth once a day  #100 x 3   Entered by:   Ami Bullins CMA   Authorized by:   Jacques Navy MD   Signed by:   Bill Salinas CMA on 09/03/2009   Method used:   Electronically to        [facility]* (retail)       [address] [place], [place]  [address]       Ph: [phone] or [phone]       Fax: [phone]   RxID:   [id number]

## 2010-07-07 NOTE — Assessment & Plan Note (Signed)
Summary: PER PT 2 WK B12 AND FLU VAC ALSO--MEN-PER [facility] DOUBLEBOOK-STC  Nurse Visit   Allergies: No Known Drug Allergies  Medication Administration  Injection # 1:    Medication: Vit B12 1000 mcg    Diagnosis: VITAMIN B12 DEFICIENCY (ICD-266.2)    Route: IM    Site: R deltoid    Exp Date: 11/06/2011    Lot #: [id number]    Mfr: [employer]    Patient tolerated injection without complications    Given by: Lamar Sprinkles, CMA (February 18, 2010 9:50 AM)  Orders Added: 1)  Flu Vaccine 30yrs + MEDICARE PATIENTS [Q2039] 2)  Administration Flu vaccine - MCR [G0008] 3)  Vit B12 1000 mcg [J3420] 4)  Admin of Therapeutic Inj  intramuscular or subcutaneous [96372]   Flu Vaccine Consent Questions     Do you have a history of severe allergic reactions to this vaccine? no    Any prior history of allergic reactions to egg and/or gelatin? no    Do you have a sensitivity to the preservative Thimersol? no    Do you have a past history of Guillan-Barre Syndrome? no    Do you currently have an acute febrile illness? no    Have you ever had a severe reaction to latex? no    Vaccine information given and explained to patient? yes    Are you currently pregnant? no    Lot Number:[id number]   Exp Date:12/05/2010   Site Given  Left Deltoid IMu   Medication Administration  Injection # 1:    Medication: Vit B12 1000 mcg    Diagnosis: VITAMIN B12 DEFICIENCY (ICD-266.2)    Route: IM    Site: R deltoid    Exp Date: 11/06/2011    Lot #: [id number]    Mfr: [employer]    Patient tolerated injection without complications    Given by: Lamar Sprinkles, CMA (February 18, 2010 9:50 AM)  Orders Added: 1)  Flu Vaccine 61yrs + MEDICARE PATIENTS [Q2039] 2)  Administration Flu vaccine - MCR [G0008] 3)  Vit B12 1000 mcg [J3420] 4)  Admin of Therapeutic Inj  intramuscular or subcutaneous [40814]

## 2010-07-07 NOTE — Progress Notes (Signed)
Summary: Gastric issues/blood in urine  Phone Note Call from Patient Call back at Home Phone [phone]   Summary of Call: Patient left message on triage that he is having gastric issues and blood in his urine. Patient would like to know if he needs ABX, a referral, or office visit. Patient says he is more than willing to come at 5pm today. Initial call taken by: Lucious Groves,  September 18, 2009 3:43 PM  Follow-up for Phone Call        spoke with pt's wife and she states pt is gone and want be back for a few hours. she states she will have pt call back. If not I will call pt in the am Follow-up by: Ami Bullins CMA,  September 18, 2009 3:55 PM  Additional Follow-up for Phone Call Additional follow up Details #1::        he needs an office visit. Additional Follow-up by: Jacques Navy MD,  September 19, 2009 4:13 AM    Additional Follow-up for Phone Call Additional follow up Details #2::    pt was seen by urologist (Dr Annabell Howells) and he is going to put pt on cipro. Pt will take for 10 and call if he needs appt Follow-up by: Ami Bullins CMA,  September 19, 2009 9:54 AM

## 2010-07-07 NOTE — Progress Notes (Signed)
  Phone Note Outgoing Call   Reason for Call: Discuss lab or test results Summary of Call: please call patient - B12 @193  is low and could be a cause of the itching feet (atypical peripheral neuropathy). Also cholesterol is through the roof.  Plan - B12 replacement: shots q2 weeks x 5 then monthly or nascabal spray once a week.  Thanks Initial call taken by: Jacques Navy MD,  November 21, 2009 12:57 PM  Follow-up for Phone Call        [facility] for pt to call back Follow-up by: Ami Bullins CMA,  November 21, 2009 1:51 PM  Additional Follow-up for Phone Call Additional follow up Details #1::        Pt states that he has not been taking cholesterol medication (lovastatin) like he should. He said he will start taking medication like he should. He will set up nurse visit to come get first b-12 next week Additional Follow-up by: Ami Bullins CMA,  November 21, 2009 3:02 PM

## 2010-07-07 NOTE — Progress Notes (Signed)
  Phone Note Refill Request Message from:  Fax from Pharmacy on January 07, 2010 11:49 AM  Refills Requested: Medication #1:  KLS OMPERAZOLE 20 MG  TBEC 1 qAM Initial call taken by: Ami Bullins CMA,  January 07, 2010 11:50 AM    Prescriptions: KLS OMPERAZOLE 20 MG  TBEC (OMEPRAZOLE) 1 qAM  #30 x 12   Entered by:   Ami Bullins CMA   Authorized by:   Jacques Navy MD   Signed by:   Bill Salinas CMA on 01/07/2010   Method used:   Electronically to        [facility]* (retail)       [address] [place], [place]  [address]       Ph: [phone] or [phone]       Fax: [phone]   RxID:   [id number]

## 2010-07-07 NOTE — Letter (Signed)
Summary: [facility]  [facility]   Imported By: Lester Reno 09/24/2009 09:37:12  _____________________________________________________________________  External Attachment:    Type:   Image     Comment:   External Document

## 2010-07-07 NOTE — Progress Notes (Signed)
Summary: RX - Zetia  Phone Note Refill Request   Refills Requested: Medication #1:  ZETIA 10 MG  TABS one once daily Patient is requesting rx for written rx to use with discount card.   Initial call taken by: Lamar Sprinkles, CMA,  November 14, 2009 9:09 AM    Prescriptions: ZETIA 10 MG  TABS (EZETIMIBE) one once daily  #30 x 6   Entered by:   Lamar Sprinkles, CMA   Authorized by:   Jacques Navy MD   Signed by:   Lamar Sprinkles, CMA on 11/14/2009   Method used:   Print then Give to Patient   RxID:   [id number]

## 2010-07-09 NOTE — Assessment & Plan Note (Signed)
Summary: PER PT B12 INJ  MEN  STC  Nurse Visit   Vital Signs:  Patient profile:   77 year old male BP sitting:   150 / 78  (left arm) Cuff size:   regular  Allergies: No Known Drug Allergies  Medication Administration  Injection # 1:    Medication: Vit B12 1000 mcg    Diagnosis: VITAMIN B12 DEFICIENCY (ICD-266.2)    Route: IM    Site: L deltoid    Exp Date: 02/2012    Lot #: [id number]    Mfr: [employer]    Patient tolerated injection without complications    Given by: Lamar Sprinkles, CMA (June 23, 2010 5:24 PM)  Orders Added: 1)  Admin of Therapeutic Inj  intramuscular or subcutaneous [96372] 2)  Vit B12 1000 mcg [J3420] 3)  Est. Patient Level I [45409]   Medication Administration  Injection # 1:    Medication: Vit B12 1000 mcg    Diagnosis: VITAMIN B12 DEFICIENCY (ICD-266.2)    Route: IM    Site: L deltoid    Exp Date: 02/2012    Lot #: [id number]    Mfr: [employer]    Patient tolerated injection without complications    Given by: Lamar Sprinkles, CMA (June 23, 2010 5:24 PM)  Orders Added: 1)  Admin of Therapeutic Inj  intramuscular or subcutaneous [96372] 2)  Vit B12 1000 mcg [J3420] 3)  Est. Patient Level I [82956]

## 2010-07-09 NOTE — Progress Notes (Signed)
Summary: Xray results  Phone Note Outgoing Call   Reason for Call: Discuss lab or test results Summary of Call: Please call patient - chest x-ray normal Initial call taken by: Jacques Navy MD,  May 28, 2010 8:42 AM  Follow-up for Phone Call        Pt advised of Chest Xray and med change (see previous note) Follow-up by: Margaret Pyle, CMA,  May 28, 2010 9:46 AM

## 2010-07-09 NOTE — Assessment & Plan Note (Signed)
Summary: congestion/rib pain/lb   Vital Signs:  Patient profile:   77 year old male Height:      68 inches Weight:      177 pounds BMI:     27.01 O2 Sat:      95 % on Room air Temp:     97.8 degrees F oral Pulse rate:   87 / minute BP sitting:   182 / 120  (left arm) Cuff size:   regular  Vitals Entered By: Bill Salinas CMA (May 27, 2010 9:11 AM)  O2 Flow:  Room air CC: pt here with c/o head congestion,runny nose, and coughing x 3 days. Pt has been taking sudafed and cheratussin cough syrup with no relief/ ab   Primary Care Provider:  norins  CC:  pt here with c/o head congestion, runny nose, and and coughing x 3 days. Pt has been taking sudafed and cheratussin cough syrup with no relief/ ab.  History of Present Illness: Patient presents with a 7-10 day h/o URI with worsening symptoms starting Sunday. He has had a very hard cough with clear to yellow sputum, no fever, incrased SOB, difficulty sleeping. He is having chest wall pain with coughing.   He has not been taking his BP meds and his pressure today is 182/120 but he is asymptomatic.   Current Medications (verified): 1)  Baby Aspirin 81 Mg  Chew (Aspirin) .... Once Daily 2)  Lovastatin 40 Mg  Tabs (Lovastatin) .... Take 1 Tab By Mouth At Bedtime 3)  Atenolol 50 Mg Tabs (Atenolol) .Marland Kitchen.. 1 By Mouth Once Daily 4)  Zetia 10 Mg  Tabs (Ezetimibe) .... One Once Daily 5)  Viagra 50 Mg  Tabs (Sildenafil Citrate) .... As Needed 6)  Loratadine 10 Mg  Tabs (Loratadine) .... Take 1 Tablet By Mouth Once A Day 7)  Kls Omperazole 20 Mg  Tbec (Omeprazole) .Marland Kitchen.. 1 Qam 8)  Anaprox Ds 550 Mg  Tabs (Naproxen Sodium) .Marland Kitchen.. 1 By Mouth Once Daily 9)  Hydrochlorothiazide 25 Mg  Tabs (Hydrochlorothiazide) .... Take 1/2 Tablet By Mouth Once A Day 10)  Finasteride 5 Mg  Tabs (Finasteride) .... Take 1 Tablet By Mouth Once A Day 11)  Levitra 10 Mg Tabs (Vardenafil Hcl) .Marland Kitchen.. 1 By Mouth As Needed 12)  Amitriptyline Hcl 25 Mg Tabs (Amitriptyline Hcl)  .Marland Kitchen.. 1 By Mouth At Bedtime 13)  Testosterone Cypionate 200 Mg/ml Oil (Testosterone Cypionate) .... As Directed By Urologist 14)  Doxycycline Hyclate 100 Mg Caps (Doxycycline Hyclate) .Marland Kitchen.. 1 By Mouth Two Times A Day X 7 For Skin Infection  Allergies (verified): No Known Drug Allergies  Past History:  Past Medical History: Last updated: 11/20/2007 Hypertension skin cancer hyperlipidemia  Past Surgical History: Last updated: 12/26/2006 Appendectomy Tonsillectomy  Family History: Last updated: 01/17/08 Father died at age 64 with CHF Mother died at age 43 A brother died at age 34 of heart disease The patient has 3 older sisters There is no family history for colon cancer Brother with terminal cancer  Social History: Last updated: Jan 17, 2008 Graduate of [employer] [place]  Married 47 years - stable marriage. Son age 55 living at home  He has 2 sons and 2 daughters Work: retired from Airline pilot with [employer]  Review of Systems       The patient complains of dyspnea on exertion, peripheral edema, and prolonged cough.  The patient denies anorexia, fever, weight loss, weight gain, decreased hearing, hoarseness, syncope, headaches, hemoptysis, abdominal pain, hematochezia, muscle weakness, difficulty walking, depression,  abnormal bleeding, and enlarged lymph nodes.    Physical Exam  General:  WNWD white male in some distress with cough and congestion Head:  normocephalic and atraumatic.   Eyes:  pupils equal and pupils round.  C&S clear Ears:  R ear normal, L ear normal, and no external deformities.   Nose:  no external deformity and no nasal discharge.   Mouth:  Oral mucosa and oropharynx without lesions or exudates.  Teeth in good repair. Neck:  supple and no masses.   Chest Wall:  very tender to palpation  Lungs:  normal respiratory effort, no intercostal retractions, no accessory muscle use, no crackles, and no wheezes.   Heart:  normal rate and regular rhythm.     Abdomen:  soft.     Impression & Recommendations:  Problem # 1:  URI (ICD-465.9)  URI with cough most likely a viral infection.  Plan - CXR           meds: prednisone 20mg  once daily x 3, 10mg  once daily x 6 days;  tussonex 1 tsp q 12; fluticasone 1 spray/nares two times a day          supportive care  His updated medication list for this problem includes:    Baby Aspirin 81 Mg Chew (Aspirin) ..... Once daily    Loratadine 10 Mg Tabs (Loratadine) .Marland Kitchen... Take 1 tablet by mouth once a day    Anaprox Ds 550 Mg Tabs (Naproxen sodium) .Marland Kitchen... 1 by mouth once daily    Tussionex Pennkinetic Er 10-8 Mg/44ml Lqcr (Hydrocod polst-chlorphen polst) .Marland Kitchen... 1 tsp q 12 for severe cough  Orders: T-2 View CXR (71020TC)  Problem # 2:  HYPERTENSION (ICD-401.9) Crisis level hypertension due to poor adherence to medical regimen. Patient clearly informed that not taking his medication is dangerous.  Plan - resume medication           he needs to return for BP check to insure adherence and control.   His updated medication list for this problem includes:    Atenolol 50 Mg Tabs (Atenolol) .Marland Kitchen... 1 by mouth once daily    Hydrochlorothiazide 25 Mg Tabs (Hydrochlorothiazide) .Marland Kitchen... Take 1/2 tablet by mouth once a day  Complete Medication List: 1)  Baby Aspirin 81 Mg Chew (Aspirin) .... Once daily 2)  Lovastatin 40 Mg Tabs (Lovastatin) .... Take 1 tab by mouth at bedtime 3)  Atenolol 50 Mg Tabs (Atenolol) .Marland Kitchen.. 1 by mouth once daily 4)  Zetia 10 Mg Tabs (Ezetimibe) .... One once daily 5)  Viagra 50 Mg Tabs (Sildenafil citrate) .... As needed 6)  Loratadine 10 Mg Tabs (Loratadine) .... Take 1 tablet by mouth once a day 7)  Kls Omperazole 20 Mg Tbec (Omeprazole) .Marland Kitchen.. 1 qam 8)  Anaprox Ds 550 Mg Tabs (Naproxen sodium) .Marland Kitchen.. 1 by mouth once daily 9)  Hydrochlorothiazide 25 Mg Tabs (Hydrochlorothiazide) .... Take 1/2 tablet by mouth once a day 10)  Finasteride 5 Mg Tabs (Finasteride) .... Take 1 tablet by mouth  once a day 11)  Levitra 10 Mg Tabs (Vardenafil hcl) .Marland Kitchen.. 1 by mouth as needed 12)  Amitriptyline Hcl 25 Mg Tabs (Amitriptyline hcl) .Marland Kitchen.. 1 by mouth at bedtime 13)  Testosterone Cypionate 200 Mg/ml Oil (Testosterone cypionate) .... As directed by urologist 14)  Prednisone 10 Mg Tabs (Prednisone) .... 2 tabs once daily x 3, 1 tab once daily x 6 15)  Fluticasone Propionate 50 Mcg/act Susp (Fluticasone propionate) .Marland Kitchen.. 1 spray to each nostril twice a day  for 7-10 days until cold and congestion clear. 16)  Tussionex Pennkinetic Er 10-8 Mg/29ml Lqcr (Hydrocod polst-chlorphen polst) .Marland Kitchen.. 1 tsp q 12 for severe cough  Patient Instructions: 1)  Upper respiratory infection - more likely viral. Plan - for chest x-ray today with call if positive; fluticasone spray for congestion; prednisone as directed; tylenol for fever. NO DECONGESTANTS. Hydrate. 2)  Blood pressure - out of control!!!!!!! Take your medication. Come in tuesday, Dec 27th for blood pressure check  Prescriptions: TUSSIONEX PENNKINETIC ER 10-8 MG/5ML LQCR (HYDROCOD POLST-CHLORPHEN POLST) 1 tsp q 12 for severe cough  #8 oz x 1   Entered and Authorized by:   Jacques Navy MD   Signed by:   Jacques Navy MD on 05/27/2010   Method used:   Handwritten   RxID:   [id number] FLUTICASONE PROPIONATE 50 MCG/ACT SUSP (FLUTICASONE PROPIONATE) 1 spray to each nostril twice a day for 7-10 days until cold and congestion clear.  #1 x 0   Entered and Authorized by:   Jacques Navy MD   Signed by:   Jacques Navy MD on 05/27/2010   Method used:   Electronically to        [facility]* (retail)       [address] [place], [place]  [address]       Ph: [phone] or [phone]       Fax: [phone]   RxID:   [id number] PREDNISONE 10 MG TABS (PREDNISONE) 2 tabs once daily x 3, 1 tab once daily x 6  #9 x 0   Entered and Authorized by:   Jacques Navy MD   Signed by:    Jacques Navy MD on 05/27/2010   Method used:   Electronically to        [facility]* (retail)       [address] [place], [place]  [address]       Ph: [phone] or [phone]       Fax: [phone]   RxID:   [id number]    Orders Added: 1)  T-2 View CXR [71020TC] 2)  Est. Patient Level III [29937]

## 2010-07-09 NOTE — Progress Notes (Signed)
Summary: RX TOO EXPENSIVE  Phone Note From Pharmacy   Caller: [facility]* Summary of Call: Pharm called, pt can not afford tussinex - cost is 122 dollars. They are req alt med.  Initial call taken by: Lamar Sprinkles, CMA,  May 27, 2010 4:22 PM  Follow-up for Phone Call        I told him tussionex was expensive! He can have phenergan/codiene 8 oz, 1 tsp q6 as needed , 1 refill Follow-up by: Jacques Navy MD,  May 27, 2010 5:46 PM    New/Updated Medications: PROMETHAZINE-CODEINE 6.25-10 MG/5ML SYRP (PROMETHAZINE-CODEINE) 1 teaspoon every 6 hours as needed for cough Prescriptions: PROMETHAZINE-CODEINE 6.25-10 MG/5ML SYRP (PROMETHAZINE-CODEINE) 1 teaspoon every 6 hours as needed for cough  #8oz x 1   Entered and Authorized by:   Margaret Pyle, CMA   Signed by:   Margaret Pyle, CMA on 05/28/2010   Method used:   Telephoned to ...       [facility]* (retail)       [address] [place], [place]  [address]       Ph: [phone] or [phone]       Fax: [phone]   RxID:   [id number]

## 2010-07-09 NOTE — Assessment & Plan Note (Signed)
Summary: B12/BP CHECK/JSS  Nurse Visit   Vital Signs:  Patient profile:   77 year old male BP sitting:   120 / 62  (left arm) Cuff size:   regular  Vitals Entered By: Bill Salinas CMA (June 03, 2010 8:49 AM)  Allergies: No Known Drug Allergies  Medication Administration  Injection # 1:    Medication: Vit B12 1000 mcg    Diagnosis: VITAMIN B12 DEFICIENCY (ICD-266.2)    Route: IM    Site: L deltoid    Exp Date: 01/2012    Lot #: [id number]    Mfr: American Regent    Patient tolerated injection without complications    Given by: Ami Bullins CMA (June 03, 2010 8:50 AM)  Orders Added: 1)  Admin of Therapeutic Inj  intramuscular or subcutaneous [96372] 2)  Vit B12 1000 mcg [J3420]

## 2010-07-09 NOTE — Progress Notes (Signed)
  Phone Note From Pharmacy   Summary of Call: pts insurance will no longer cover metoprolol XL 50mg  extended release . Can he switch to metoprolol tartrate, coreg or atenolol. Please Advise. [facility].  Initial call taken by: Ami Bullins CMA,  May 25, 2010 2:36 PM  Follow-up for Phone Call        change to atenolol 50mg  once daily Med list updated Follow-up by: Jacques Navy MD,  May 25, 2010 5:49 PM    New/Updated Medications: ATENOLOL 50 MG TABS (ATENOLOL) 1 by mouth once daily Prescriptions: ATENOLOL 50 MG TABS (ATENOLOL) 1 by mouth once daily  #30 x 12   Entered and Authorized by:   Jacques Navy MD   Signed by:   Jacques Navy MD on 05/25/2010   Method used:   Electronically to        [facility]* (retail)       [address] [place], [place]  [address]       Ph: [phone] or [phone]       Fax: [phone]   RxID:   [id number]

## 2010-07-22 ENCOUNTER — Telehealth: Payer: Self-pay | Admitting: Internal Medicine

## 2010-07-29 NOTE — Progress Notes (Signed)
  Phone Note Refill Request Message from:  Fax from Pharmacy on July 22, 2010 1:06 PM  Refills Requested: Medication #1:  LOVASTATIN 40 MG  TABS Take 1 tab by mouth at bedtime   Dosage confirmed as above?Dosage Confirmed   Notes: [facility] Initial call taken by: Zella Ball Ewing CMA (AAMA),  July 22, 2010 1:06 PM    Prescriptions: LOVASTATIN 40 MG  TABS (LOVASTATIN) Take 1 tab by mouth at bedtime  #30 x 12   Entered by:   Zella Ball Ewing CMA (AAMA)   Authorized by:   Jacques Navy MD   Signed by:   Scharlene Gloss CMA (AAMA) on 07/22/2010   Method used:   Faxed to ...       [facility]* (retail)       [address] [place], [place]  [address]       Ph: [phone] or [phone]       Fax: [phone]   RxID:   [id number]

## 2010-07-30 ENCOUNTER — Ambulatory Visit (INDEPENDENT_AMBULATORY_CARE_PROVIDER_SITE_OTHER): Payer: Managed Care, Other (non HMO)

## 2010-07-30 ENCOUNTER — Encounter: Payer: Self-pay | Admitting: Internal Medicine

## 2010-07-30 DIAGNOSIS — E538 Deficiency of other specified B group vitamins: Secondary | ICD-10-CM

## 2010-08-04 NOTE — Assessment & Plan Note (Signed)
Summary: b12/norins/lb  Nurse Visit   Vital Signs:  Patient profile:   77 year old male BP sitting:   158 / 78  (right arm) Cuff size:   regular CC: BP check   Allergies: No Known Drug Allergies  Medication Administration  Injection # 1:    Medication: Vit B12 1000 mcg    Diagnosis: VITAMIN B12 DEFICIENCY (ICD-266.2)    Route: IM    Site: L deltoid    Exp Date: 04/2012    Lot #: [id number]    Mfr: American Regent    Patient tolerated injection without complications    Given by: Burnard Leigh CMA(AAMA) (July 30, 2010 9:24 AM)  Orders Added: 1)  Vit B12 1000 mcg [J3420] 2)  Est. Patient Level I [[address]]

## 2010-09-01 ENCOUNTER — Ambulatory Visit (INDEPENDENT_AMBULATORY_CARE_PROVIDER_SITE_OTHER): Payer: Managed Care, Other (non HMO) | Admitting: *Deleted

## 2010-09-01 DIAGNOSIS — E538 Deficiency of other specified B group vitamins: Secondary | ICD-10-CM

## 2010-09-01 MED ORDER — CYANOCOBALAMIN 1000 MCG/ML IJ SOLN: 1000.0000 ug | Freq: Once | INTRAMUSCULAR | Status: DC

## 2010-09-10 ENCOUNTER — Ambulatory Visit (INDEPENDENT_AMBULATORY_CARE_PROVIDER_SITE_OTHER): Payer: Medicare Other | Admitting: Internal Medicine

## 2010-09-10 ENCOUNTER — Encounter: Payer: Self-pay | Admitting: Internal Medicine

## 2010-09-10 DIAGNOSIS — M26629 Arthralgia of temporomandibular joint, unspecified side: Secondary | ICD-10-CM

## 2010-09-10 NOTE — Patient Instructions (Signed)
TMJ Problems  (Temporal Mandibular Joint Dysfunction) TMJ dysfunction means there are problems with the joint between your jaw and your skull. This is a joint lined by cartilage like other joints in your body but also has a small disc in the joint which keeps the bones from rubbing on each other. These joints are like other joints and can get inflamed (sore) from arthritis and other problems. When this joint gets sore, it can cause headaches and pain in the jaw and the face. CAUSES Usually the arthritic types of problems are caused by soreness in the joint. Soreness in the joint can also be caused by overuse. This may come from grinding your teeth. It may also come from mis-alignment in the joint. DIAGNOSIS (LEARNING WHAT IS WRONG) Diagnosis of this condition can often be made by history and exam. Sometimes your caregiver may need X-rays or an MRI scan to determine the exact cause. It may be necessary to see your dentist to determine if your teeth and jaws are lined up correctly. TREATMENT Most of the time this problem is not serious; however, sometimes it can persist (become chronic). When this happens medications that will cut down on inflammation (soreness) help. Sometimes a shot of cortisone into the joint will be helpful. If your teeth are not aligned it may help for your dentist to make a splint for your mouth that can help this problem. If no physical problems can be found, the problem may come from tension. If tension is found to be the cause, biofeedback or relaxation techniques may be helpful. HOME CARE INSTRUCTIONS  Later in the day, applications of ice packs may be helpful. Ice can be used in a plastic bag with a towel around it to prevent frostbite to skin. This may be used about every 2 hours for 20 to 30 minutes, as needed while awake, or as directed by your caregiver.   Only take over-the-counter or prescription medicines for pain, discomfort, or fever as directed by your caregiver.    If physical therapy was prescribed, follow your caregiver's directions.   Wear mouth appliances as directed if they were given.  Document Released: 02/16/2001 Document Re-Released: 08/20/2008 [facility] Patient Information 2011 [place], [place].  Use aspercreme as needed. Also may take aleve twice a day as long as it hurts. Cut food into small bites.

## 2010-09-12 ENCOUNTER — Encounter: Payer: Self-pay | Admitting: Internal Medicine

## 2010-09-12 DIAGNOSIS — M26629 Arthralgia of temporomandibular joint, unspecified side: Secondary | ICD-10-CM | POA: Insufficient documentation

## 2010-09-12 NOTE — Progress Notes (Signed)
  Subjective:    Patient ID: Austin Torres, male    DOB: 20-Aug-1933, 77 y.o.   MRN: [id number]  HPI Mr. Hashem is seen as an acute walk-in. His complaint is pain in the right jaw and ear. This has been present for several days. He has had no fever, chills, hearing loss, drainage from the ear. He reports his upper gums hurt but he has had no recent dental work.   PMH, FamHx and SocHx reviewed for any changes and relevance.     Review of Systems Review of Systems  Constitutional:  Negative for fever, chills, activity change and unexpected weight change.  HENT:  Negative for hearing loss, ear pain, congestion, neck stiffness and postnasal drip.   Eyes: Negative for pain, discharge and visual disturbance.  Respiratory: Negative for chest tightness and wheezing.   Cardiovascular: Negative for chest pain and palpitations.       [No decreased exercise tolerance Gastrointestinal: [No change in bowel habit. No bloating or gas. No reflux or indigestion Genitourinary: Negative for urgency, frequency, flank pain and difficulty urinating.  Musculoskeletal: Negative for myalgias, back pain, arthralgias and gait problem.  Neurological: Negative for dizziness, tremors, weakness and headaches.  Hematological: Negative for adenopathy.  Psychiatric/Behavioral: Negative for behavioral problems and dysphoric mood.       Objective:   Physical Exam  Vitals reviewed. Constitutional: He is oriented to person, place, and time. He appears well-developed and well-nourished. No distress.  HENT:  Head: Normocephalic and atraumatic.    Right Ear: Tympanic membrane, external ear and ear canal normal. No drainage or swelling. Tympanic membrane is not erythematous.  Mouth/Throat: Uvula is midline and mucous membranes are normal. He does not have dentures. No oral lesions. Normal dentition. No oropharyngeal exudate, posterior oropharyngeal edema or posterior oropharyngeal erythema.       Tender at the right TMJ  with external pressure and with posterior pressure from inside the EAC.  Neck: Neck supple.  Cardiovascular: Normal rate and regular rhythm.   Pulmonary/Chest: Effort normal and breath sounds normal.  Musculoskeletal: Normal range of motion.  Neurological: He is alert and oriented to person, place, and time.  Skin: Skin is warm and dry.  Psychiatric: He has a normal mood and affect. His behavior is normal.          Assessment & Plan:  1. TMJ tenderness - exam is unrevealing except for TMJ tenderness.  Plan - small bites           Use of aspercreme as lineament over external aspect of TMJ           OTC NSAID - e.g aleve bid.

## 2010-09-17 ENCOUNTER — Encounter: Payer: Self-pay | Admitting: Internal Medicine

## 2010-10-14 ENCOUNTER — Other Ambulatory Visit: Payer: Self-pay | Admitting: Internal Medicine

## 2010-10-23 NOTE — Letter (Signed)
December 18, 2005     Austin Torres. Austin Torres, M.D.  [address]  [place], [place] [address]   RE:  Austin Torres  MRN:  [id number]  /  DOB:  1933/11/26   Dear Austin Torres:   Thank you very much for seeing and evaluating [name] for  painless hematuria.   I saw the patient in our [facility] with a complaint of hematuria.  His  examination was unremarkable.  At this point, I am concerned for other  underlying causes for his hematuria.   The patient is treated for hypertension and hyperlipidemia.  He is  noncompliant with medical regimen on a frequent basis and is hypertensive at  today's visit.   I have enclosed a copy of my most recent complete H&P to document his past  medical history, family history, and social history as well as medications.   If I can provide any additional information, please do not hesitate to  contact me.   As always, I appreciate your assistance in the care of my patients and look  forward to hearing from you.  I remain yours truly.   Sincerely,     Rosalyn Gess. Norins, MD  MEN/MedQ  DD:  12/18/2005  DT:  12/18/2005  Job #:  [id number]

## 2010-10-23 NOTE — Assessment & Plan Note (Signed)
[facility]                           PRIMARY CARE OFFICE NOTE   NAME:Austin Torres, Austin Torres                     MRN:          [id number]  DATE:09/12/2006                            DOB:          06-Jun-1934    Mr. Postlewait was seen in the office September 07, 2006 for back pain.  Please  see that dictation. At that time, he was started on etodolac 500 mg  b.i.d.  The patient reports that he had a significant response with  swelling and edema and numbness of his leg, his left arm, and the left  side of his mouth.  He stopped the medication, and this resolved.   In the office, the patient seemed to be normal, with no signs of facial  droop or weakness, no signs of swelling.   PLAN:  Patient to discontinue etodolac, and his chart is marked as a  drug allergy.  The patient is given a trial of Celebrex 200 mg daily,  which he has tolerated well in the past.     Rosalyn Gess. Norins, MD  Electronically Signed    MEN/MedQ  DD: 09/12/2006  DT: 09/13/2006  Job #: [id number]

## 2010-10-23 NOTE — Assessment & Plan Note (Signed)
[facility]                           PRIMARY CARE OFFICE NOTE   NAME:Austin Torres, Austin Torres                     MRN:          [id number]  DATE:07/28/2006                            DOB:          02-14-1934    Austin Torres is a 77 year old Caucasian gentleman followed for  hypertension and hyperlipidemia who presents for followup evaluation and  exam.  He was last seen in the office June 23, 2006, by Austin Torres for an upper respiratory infection with cough and poorly controlled  hypertension.  He has been evaluated recently by Austin Torres for hematuria  with a negative evaluation.   The patient reports at this time he is feeling well and doing well.  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  Please see the chart for  photographs.   PAST MEDICAL HISTORY:  Well documented in my note of June 08, 2005.   CURRENT MEDICATIONS:  1. Aspirin 81 mg daily.  2. Lovastatin 80 mg daily.  3. Cardizem LA 240 mg daily.  4. Enalapril 20 mg daily.  5. Zetia 10 mg daily.   SOCIAL HISTORY:  Well documented in my note of June 08, 2005, with no  significant change.  We did discuss the need to be accommodating and  relaxed about having our children at home.   REVIEW OF SYSTEMS:  Negative for any constitutional, cardiovascular,  respiratory, GI, or GU complaints.   EXAMINATION:  VITAL SIGNS:  Temperature was 97.2, blood pressure 156/77,  pulse was 71, weight 170 pounds.  GENERAL APPEARANCE:  A well-nourished, well-developed gentleman looking  younger than his stated chronologic age in no acute distress.  HEENT:  Normocephalic, atraumatic.  EACs and TMs were unremarkable.  Oropharynx with native dentition in good repair.  No buccal or palatal  lesions were noted.  Posterior pharynx was clear.  Conjunctivae and  sclerae were clear.  PERRLA, EOMI.  Funduscopic exam was unremarkable.  NECK:   Supple without thyromegaly.  NODES:  No adenopathy was noted in the cervical or supraclavicular  regions.  CHEST:  No CVA tenderness.  LUNGS:  Clear to auscultation and percussion.  CARDIOVASCULAR:  Shows 2+ radial pulse.  No JVD or carotid bruits.  He  had a quiet precordium with a regular rate and rhythm without murmurs,  rubs or gallops.  ABDOMEN:  Soft, no guarding or rebound.  No organosplenomegaly was  noted.  RECTAL:  Normal sphincter tone was noted.  EXTREMITIES:  Without clubbing, cyanosis, or edema.  NEUROLOGIC:  Grossly nonfocal.   DATABASE:  Cholesterol was 163, triglycerides 106, HDL 51.3, LDL was 91.  PSA was normal at 2.01.  Liver functions were normal.  Urinalysis was  unremarkable.   ASSESSMENT AND PLAN:  1. Hypertension.  The patient remains suboptimally controlled.  Will      continue the present medications and add hydrochlorothiazide 12.5      mg daily.  2. Hyperlipidemia.  Excellent response to lovastatin with Zetia and      the patient is definitely  at goal.  He will continue on his present      medication.  3. Health maintenance.  The patient has been followed up by Austin Torres for hematuria.  He did have colonoscopy April 23, 2003,      and will be due for followup in 2009.  Last chest x-ray from      January 2006 was no evidence of cardiac disease on his EKG.  From      July 08, 2005, was unremarkable except for a mild conduction      defect of a right bundle-branch block.   OVERALL IMPRESSION:  This is a healthy gentleman doing well on his  present medical regimen.  I have encouraged him to continue with his  abstemiousness as well as a regular exercise program.     Rosalyn Gess. Norins, MD  Electronically Signed    MEN/MedQ  DD: 07/29/2006  DT: 07/29/2006  Job #: [id number]
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
## 2010-10-23 NOTE — Op Note (Signed)
[place]. [facility]  Patient:    Austin Torres, Austin Torres                       MRN: [id number] Proc. Date: 11/15/00 Adm. Date:  [id number] Attending:  Eloise Levels                           Operative Report  PREOPERATIVE DIAGNOSIS:  Malignant melanoma of top of scalp.  POSTOPERATIVE DIAGNOSIS:  Malignant melanoma of top of scalp.  OPERATION: 1. Wide local excision of malignant melanoma of top of scalp. 2. Complex closure of 5.2 cm scalp incision.  SURGEON:  Mary A. Contogiannis, M.D.  ANESTHESIA:   IV sedation along with 1% lidocaine with epinephrine.  ANESTHESIOLOGIST:  Guadalupe Maple, M.D.  COMPLICATIONS:  None.  INDICATIONS FOR THE PROCEDURE:  The patient is a 77 year old Caucasian male with biopsy-proven malignant melanoma of the scalp.  He has been evaluated by a general surgeon, and they feel that a sentinel node biopsy is not necessary; all that is needed is a wide local excision of the lesion.  He, therefore, presents to undergo the wide local excision of this time and request that I proceed with surgery.  DESCRIPTION OF PROCEDURE:  The patient was brought to the operating room and placed on the OR table in the supine position.  After adequate IV sedation was obtained, the patients scalp was prepped with Betadine and draped in a sterile fashion.  The skin and subcutaneous tissues in the area of the healing melanoma biopsy site as well as all the areas of the scalp that had to be undermined were injected with 1% lidocaine with epinephrine.  After adequate hemostasis anesthesia had taken effect, the procedure was begun.  The melanoma was excised with 1.0 cm margins in a circumferential fashion.  The lesion was marked at the 12 oclock position and then passed off the table to undergo permanent pathologic section evaluation.  The excision was full thickness through the skin and subcutaneous tissue down to the muscle fascial layer. Next, the  skin and scalp was widely undermined in the galeal plane to allow closure of the incision.  A significant amount of undermining had to be performed, and there was still a slight amount of tension left at the central-most aspect of the incision.  The galea was the reapproximated using 2-0 Monocryl suture.  Two retention sutures using 2-0 Prolene were then placed in the middle of the incision to bring the incision together and take as much tension as possible off of the middle aspect of the wound.  Next, the dermal layer was closed using 3-0 Monocryl suture.  The skin was then closed using 4-0 Prolene in interrupted sutures, interrupted mattress sutures, and a running baseball stitch as appropriate for the different parts of the wound. The incision was then dressed with bacitracin ointment and 4 x 4s followed by a light turban-like dressing.  There were no complications.  The patient tolerated the procedure well.  Final needle and sponge counts were reported as being correct at the end of the case.  The patient was then discharged home in the care of his wife in stable condition.  Followup appointment will be tomorrow in the office. DD:  11/15/00 TD:  11/15/00 Job: [id number]
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
## 2010-12-23 ENCOUNTER — Other Ambulatory Visit: Payer: Self-pay | Admitting: Internal Medicine

## 2011-01-01 ENCOUNTER — Other Ambulatory Visit: Payer: Self-pay | Admitting: Internal Medicine

## 2011-01-01 ENCOUNTER — Other Ambulatory Visit: Payer: Managed Care, Other (non HMO)

## 2011-01-01 DIAGNOSIS — Z0389 Encounter for observation for other suspected diseases and conditions ruled out: Secondary | ICD-10-CM

## 2011-01-01 DIAGNOSIS — Z0000 Encounter for general adult medical examination without abnormal findings: Secondary | ICD-10-CM

## 2011-01-08 ENCOUNTER — Ambulatory Visit (INDEPENDENT_AMBULATORY_CARE_PROVIDER_SITE_OTHER): Payer: Medicare Other | Admitting: Internal Medicine

## 2011-01-08 ENCOUNTER — Encounter: Payer: Self-pay | Admitting: Internal Medicine

## 2011-01-08 ENCOUNTER — Other Ambulatory Visit: Payer: Self-pay | Admitting: Internal Medicine

## 2011-01-08 ENCOUNTER — Other Ambulatory Visit (INDEPENDENT_AMBULATORY_CARE_PROVIDER_SITE_OTHER): Payer: Medicare Other

## 2011-01-08 VITALS — BP 164/92 | HR 48 | Temp 97.5°F | Wt 170.0 lb

## 2011-01-08 DIAGNOSIS — Z0000 Encounter for general adult medical examination without abnormal findings: Secondary | ICD-10-CM

## 2011-01-08 DIAGNOSIS — Z136 Encounter for screening for cardiovascular disorders: Secondary | ICD-10-CM

## 2011-01-08 DIAGNOSIS — E538 Deficiency of other specified B group vitamins: Secondary | ICD-10-CM

## 2011-01-08 DIAGNOSIS — Z125 Encounter for screening for malignant neoplasm of prostate: Secondary | ICD-10-CM

## 2011-01-08 DIAGNOSIS — Z87898 Personal history of other specified conditions: Secondary | ICD-10-CM

## 2011-01-08 DIAGNOSIS — E785 Hyperlipidemia, unspecified: Secondary | ICD-10-CM

## 2011-01-08 DIAGNOSIS — Z0389 Encounter for observation for other suspected diseases and conditions ruled out: Secondary | ICD-10-CM

## 2011-01-08 DIAGNOSIS — E291 Testicular hypofunction: Secondary | ICD-10-CM

## 2011-01-08 LAB — BASIC METABOLIC PANEL
Calcium: 9.2 mg/dL (ref 8.4–10.5)
Creatinine, Ser: 1.1 mg/dL (ref 0.4–1.5)
GFR: 67.26 mL/min (ref >60.00)
Glucose, Bld: 118 mg/dL — ABNORMAL HIGH (ref 70–99)
Sodium: 139 mEq/L (ref 135–145)

## 2011-01-08 LAB — HEPATIC FUNCTION PANEL
ALT: 18 U/L (ref 0–53)
AST: 22 U/L (ref 0–37)
Alkaline Phosphatase: 45 U/L (ref 39–117)
Bilirubin, Direct: 0.2 mg/dL (ref 0.0–0.3)
Total Bilirubin: 1.4 mg/dL — ABNORMAL HIGH (ref 0.3–1.2)

## 2011-01-08 LAB — LIPID PANEL
HDL: 58.3 mg/dL (ref >39.00)
Triglycerides: 202 mg/dL — ABNORMAL HIGH (ref 0.0–149.0)

## 2011-01-08 LAB — CBC WITH DIFFERENTIAL/PLATELET
Eosinophils Relative: 5.8 % — ABNORMAL HIGH (ref 0.0–5.0)
HCT: 43.1 % (ref 39.0–52.0)
Lymphs Abs: 1.8 10*3/uL (ref 0.7–4.0)
MCHC: 33.9 g/dL (ref 30.0–36.0)
MCV: 95.3 fl (ref 78.0–100.0)
Monocytes Absolute: 0.7 10*3/uL (ref 0.1–1.0)
Platelets: 170 10*3/uL (ref 150.0–400.0)
WBC: 7.9 10*3/uL (ref 4.5–10.5)

## 2011-01-08 LAB — URINALYSIS
Specific Gravity, Urine: 1.01 (ref 1.000–1.030)
Total Protein, Urine: NEGATIVE
Urine Glucose: 100
pH: 7 (ref 5.0–8.0)

## 2011-01-08 LAB — PSA: PSA: 1.96 ng/mL (ref 0.10–4.00)

## 2011-01-08 NOTE — Progress Notes (Signed)
Subjective:    Patient ID: Austin Torres, male    DOB: 1933-09-17, 77 y.o.   MRN: [id number]  HPI  The patient is here for annual Medicare wellness examination and management of other chronic and acute problems. He is feeling well and doing Ok.   The risk factors are reflected in the social history.  The roster of all physicians providing medical care to patient - is listed in the Snapshot section of the chart.  Activities of daily living:  The patient is 100% inedpendent in all ADLs: dressing, toileting, feeding as well as independent mobility  Home safety : The patient has smoke detectors in the home. They wear seatbelts. No firearms at home. There is no violence in the home.   There is no risks for hepatitis, STDs or HIV. There is no   history of blood transfusion. They have no travel history to infectious disease endemic areas of the world.  The patient seen their dentist in the last six month. They have seen their eye doctor in the last year. They deny any hearing difficulty and have not had audiologic testing in the last year.  They do  have excessive sun exposure. Discussed the need for sun protection: hats, long sleeves and use of sunscreen if there is significant sun exposure.   Diet: the importance of a healthy diet is discussed. They do have a healthy diet.  The patient does not have a regular exercise program.  The benefits of regular aerobic exercise were discussed.  Depression screen: there are no signs or vegative symptoms of depression- irritability, change in appetite, anhedonia, sadness/tearfullness.  Cognitive assessment: the patient manages all their financial and personal affairs and is actively engaged. They could relate day,date,year and events; recalled 3/3 objects at 3 minutes; performed clock-face test normally.  The following portions of the patient's history were reviewed and updated as appropriate: allergies, current medications, past family history, past  medical history,  past surgical history, past social history  and problem list.  Vision, hearing, body mass index were assessed and reviewed.   During the course of the visit the patient was educated and counseled about appropriate screening and preventive services including : fall prevention , diabetes screening, nutrition counseling, colorectal cancer screening, and recommended immunizations.  Past Medical History  Diagnosis Date  . Hyperlipidemia   . Hypertension   . Hypogonadism male   . BPH (benign prostatic hypertrophy)   . B12 deficiency   . Skin cancer    Past Surgical History  Procedure Date  . Appendectomy   . Tonsillectomy    Family History  Problem Relation Age of Onset  . Heart disease Father   . Heart disease Brother     CAD/MI  . Cancer Neg Hx     no colon cancer, no prostate cancer   History   Social History  . Marital Status: Married    Spouse Name: N/A    Number of Children: N/A  . Years of Education: N/A   Occupational History  . Not on file.   Social History Main Topics  . Smoking status: Current Everyday Smoker -- 1.0 packs/day    Types: Cigars  . Smokeless tobacco: Never Used  . Alcohol Use: 7.5 oz/week    15 drink(s) per week  . Drug Use: No  . Sexually Active: Yes -- Male partner(s)   Other Topics Concern  . Not on file   Social History Narrative   HSG, Gala Lewandowsky of [employer] @ [place].  Married '61 2 dtrs - '68, 69; 2 sons - '72, '78. 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 ACP - No CPR, No mechanical ventilation for more than short-term. No futile or heroic measures.          Review of Systems Review of Systems  Constitutional:  Negative for fever, chills, activity change and unexpected weight change.  HEENT:  Negative for hearing loss, ear pain, congestion, neck stiffness and postnasal drip. Negative for sore throat or swallowing problems. Negative for dental complaints.     Eyes: Negative for vision loss or change in visual acuity.  Respiratory: Negative for chest tightness and wheezing.   Cardiovascular: Negative for chest pain and palpitation. No decreased exercise tolerance Gastrointestinal: No change in bowel habit. No bloating or gas. No reflux or indigestion Genitourinary: Negative for urgency, frequency, flank pain and difficulty urinating.  Musculoskeletal: Negative for myalgias, back pain, arthralgias and gait problem.  Neurological: Negative for dizziness, tremors, weakness and headaches.  Hematological: Negative for adenopathy.  Psychiatric/Behavioral: Negative for behavioral problems and dysphoric mood.       Objective:   Physical Exam Vital signs reviewed Gen'l: Well nourished well developed     male in no acute distress  HENT:  Head: Normocephalic and atraumatic.  Right Ear: External ear normal. EAC/TM nl Left Ear: External ear normal.  EAC/TM nl Nose: Nose normal.  Mouth/Throat: Oropharynx is clear and moist. Dentition - native, in good repair. No buccal or palatal lesions. Posterior pharynx clear. Eyes: Conjunctivae and sclera clear. EOM intact. Pupils are equal, round, and reactive to light. Right eye exhibits no discharge. Left eye exhibits no discharge. Neck: Normal range of motion. Neck supple. No JVD present. No tracheal deviation present. No thyromegaly present.  Cardiovascular: Normal rate, regular rhythm, no gallop, no friction rub, no murmur heard.      Quiet precordium. 2+ radial and DP pulses . No carotid bruits Pulmonary/Chest: Effort normal. No respiratory distress or increased WOB, no wheezes, no rales. No chest wall deformity or CVAT. Abdominal: Soft. Bowel sounds are normal in all quadrants. He exhibits no distension, no tenderness, no rebound or guarding, No heptosplenomegaly  Genitourinary:   Musculoskeletal: Normal range of motion. He exhibits no edema and no tenderness.       Small and large joints without redness,  synovial thickening or deformity. Full range of motion preserved about all small, median and large joints.  Lymphadenopathy:    He has no cervical or supraclavicular adenopathy.  Neurological: He is alert and oriented to person, place, and time. CN II-XII intact. DTRs 2+ and symmetrical biceps, radial and patellar tendons. Cerebellar function normal with no tremor, rigidity, normal gait and station.  Skin: Skin is warm and dry. No rash noted. No erythema.  Psychiatric: He has a normal mood and affect. His behavior is normal. Thought content normal.   Lab Results  Component Value Date   WBC 7.9 01/08/2011   HGB 14.6 01/08/2011   HCT 43.1 01/08/2011   PLT 170.0 01/08/2011   CHOL 253* 01/08/2011   TRIG 202.0* 01/08/2011   HDL 58.30 01/08/2011   LDLDIRECT 155.8 01/08/2011   ALT 18 01/08/2011   AST 22 01/08/2011   NA 139 01/08/2011   K 4.2 01/08/2011   CL 101 01/08/2011   CREATININE 1.1 01/08/2011   BUN 18 01/08/2011   CO2 30 01/08/2011   TSH 2.70 01/08/2011   PSA 1.96 01/08/2011  Assessment & Plan:

## 2011-01-10 ENCOUNTER — Encounter: Payer: Self-pay | Admitting: Internal Medicine

## 2011-01-10 DIAGNOSIS — Z7189 Other specified counseling: Secondary | ICD-10-CM | POA: Insufficient documentation

## 2011-01-10 NOTE — Assessment & Plan Note (Signed)
He ha been followed by urology. Currently on proscar and has no complaints of excessive nocturia or urinary frequency.  Plan - continue present treatment

## 2011-01-10 NOTE — Assessment & Plan Note (Signed)
Sub opitmal control with LDL 155.8 with a goal of 130 or less if not lower given risk factors of age, hypertension and lipids.  Plan  Inquire as to adherence to regimen - lovasttin 60 mg q 24          If taking medication as prescribed will change to crestor 20 mg once a day to achieve goal.

## 2011-01-10 NOTE — Assessment & Plan Note (Signed)
Had low testosterone. Had been followed by urology. Continues to take testosterone replacement once a month by injection.

## 2011-01-10 NOTE — Assessment & Plan Note (Signed)
Interval medical history is benign. Physical exam is normal. Routine lab results are in normal limits except for elevated LDL cholesterol. Colorectal cancer screening - no colonoscopy in EMR, thus he is a candidate for colonoscopy. He will call when ready to schedule. Prostate cancer screening - PSA is normal. Immunizations: Tetanus May '11; Pneumonia vaccine Sept '09; Shingles vaccine July '11. 12 lead EKG with bradycardia and inverted T waves suggesting possible ischemia - will refer for stress testing.   In summary - a very nice man who is due for colonoscopy and  With risk factors and abnormal EKG is a candidate for myoview stress testing.

## 2011-01-10 NOTE — Assessment & Plan Note (Signed)
Lab Results  Component Value Date   VITAMINB12 232 12/29/2009   Taking replacement (?). Will need B12 level at his convenience with recommendations to follow.

## 2011-01-13 ENCOUNTER — Telehealth: Payer: Self-pay | Admitting: *Deleted

## 2011-01-13 NOTE — Telephone Encounter (Signed)
Message copied by [facility], Gwendoline Judy B on Wed Jan 13, 2011  8:45 AM ------      Message from: Illene Regulus E      Created: Sun Jan 10, 2011 12:12 PM       1. Call patient - is he taking his lovastatin on a regular basis?      2. I recommend stress myoveiw and will have that set up for him.      3. He is due for colonoscopy - if he wishes to move forward we will schedule for him.      4. Send copy of  Note.

## 2011-01-13 NOTE — Telephone Encounter (Signed)
Spoke with pt he has not been taking the lovastatin, pt states the co-pay on this medication is too expensive. Alternative? Pt did say that he has been taking the Zetia samples that were given and questioned if you wanted to repeat labs after he has been on this medication everyday for sometime.  He does want to set up a stress myoview and states he does want to set up colonoscopy but prefers to do that in the fall.  Pt uses [facility].  Please Advise on all of the above

## 2011-01-13 NOTE — Telephone Encounter (Signed)
Lovastatin is as cheap as it gets - $4 at [facility]

## 2011-01-14 ENCOUNTER — Other Ambulatory Visit: Payer: Self-pay | Admitting: *Deleted

## 2011-01-14 MED ORDER — LOVASTATIN ER 60 MG PO TB24: 60.0000 mg | ORAL_TABLET | Freq: Every day | ORAL | Status: DC

## 2011-01-14 NOTE — Progress Notes (Signed)
Addended by: Vernie Murders on: 01/14/2011 02:32 PM   Modules accepted: Orders

## 2011-01-26 ENCOUNTER — Other Ambulatory Visit: Payer: Self-pay | Admitting: Internal Medicine

## 2011-01-26 ENCOUNTER — Telehealth: Payer: Self-pay | Admitting: *Deleted

## 2011-01-26 NOTE — Telephone Encounter (Signed)
Dr Debby Bud, below is FYI for you
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
## 2011-01-26 NOTE — Telephone Encounter (Signed)
Tried to call home. No answer

## 2011-01-27 ENCOUNTER — Encounter (HOSPITAL_COMMUNITY): Payer: Medicare Other | Admitting: Radiology

## 2011-02-03 ENCOUNTER — Telehealth: Payer: Self-pay | Admitting: *Deleted

## 2011-02-03 NOTE — Telephone Encounter (Signed)
Male called for patient req a call back regarding pt's meds.
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
## 2011-03-01 ENCOUNTER — Other Ambulatory Visit: Payer: Self-pay | Admitting: Internal Medicine

## 2011-03-01 ENCOUNTER — Ambulatory Visit (INDEPENDENT_AMBULATORY_CARE_PROVIDER_SITE_OTHER): Payer: Medicare Other | Admitting: *Deleted

## 2011-03-01 ENCOUNTER — Other Ambulatory Visit (INDEPENDENT_AMBULATORY_CARE_PROVIDER_SITE_OTHER): Payer: Medicare Other

## 2011-03-01 DIAGNOSIS — E785 Hyperlipidemia, unspecified: Secondary | ICD-10-CM

## 2011-03-01 DIAGNOSIS — E291 Testicular hypofunction: Secondary | ICD-10-CM

## 2011-03-01 DIAGNOSIS — Z23 Encounter for immunization: Secondary | ICD-10-CM

## 2011-03-01 LAB — HEPATIC FUNCTION PANEL
Alkaline Phosphatase: 46 U/L (ref 39–117)
Bilirubin, Direct: 0.1 mg/dL (ref 0.0–0.3)
Total Bilirubin: 0.9 mg/dL (ref 0.3–1.2)

## 2011-03-01 LAB — LIPID PANEL
Cholesterol: 188 mg/dL (ref 0–200)
LDL Cholesterol: 91 mg/dL (ref 0–99)
VLDL: 38.4 mg/dL (ref 0.0–40.0)

## 2011-03-02 LAB — TESTOSTERONE, FREE, TOTAL, SHBG
Sex Hormone Binding: 36 nmol/L (ref 13–71)
Testosterone, Free: 49.3 pg/mL (ref 47.0–244.0)
Testosterone-% Free: 1.8 % (ref 1.6–2.9)

## 2011-03-04 ENCOUNTER — Encounter: Payer: Self-pay | Admitting: Internal Medicine

## 2011-04-23 ENCOUNTER — Telehealth: Payer: Self-pay | Admitting: Internal Medicine

## 2011-04-23 DIAGNOSIS — E785 Hyperlipidemia, unspecified: Secondary | ICD-10-CM

## 2011-04-23 MED ORDER — EZETIMIBE 10 MG PO TABS: 10.0000 mg | ORAL_TABLET | Freq: Every day | ORAL | Status: DC

## 2011-04-23 NOTE — Telephone Encounter (Signed)
Austin Torres came into the office for Zetia samples.  He wants to have labs for Cholesterol soon.  He may change his insurance plan and may need to discuss changing medication for the new plan.  Is it ok for him to come in for the labs?  Please let him know.

## 2011-04-24 NOTE — Telephone Encounter (Signed)
Ok for lab  Order entered.

## 2011-04-26 NOTE — Telephone Encounter (Signed)
Pt is aware.  

## 2011-05-12 ENCOUNTER — Other Ambulatory Visit: Payer: Self-pay | Admitting: *Deleted

## 2011-05-12 NOTE — Telephone Encounter (Signed)
Pt c/o diarrhea and tried to refill Diphenoxylate [his Rx is expired]; requesting new Rx to [facility] [phone]

## 2011-05-13 MED ORDER — DIPHENOXYLATE-ATROPINE 2.5-0.025 MG/5ML PO LIQD: 5.0000 mL | Freq: Four times a day (QID) | ORAL | Status: DC | PRN

## 2011-05-13 NOTE — Telephone Encounter (Signed)
Done

## 2011-05-13 NOTE — Telephone Encounter (Signed)
Ok for refill on lomotil 

## 2011-05-14 ENCOUNTER — Ambulatory Visit (INDEPENDENT_AMBULATORY_CARE_PROVIDER_SITE_OTHER): Payer: Medicare Other | Admitting: Internal Medicine

## 2011-05-14 ENCOUNTER — Encounter: Payer: Self-pay | Admitting: Internal Medicine

## 2011-05-14 VITALS — BP 152/76 | HR 54 | Temp 98.1°F | Wt 174.4 lb

## 2011-05-14 DIAGNOSIS — M79609 Pain in unspecified limb: Secondary | ICD-10-CM

## 2011-05-14 DIAGNOSIS — M79604 Pain in right leg: Secondary | ICD-10-CM

## 2011-05-14 DIAGNOSIS — M62838 Other muscle spasm: Secondary | ICD-10-CM

## 2011-05-14 MED ORDER — OXYCODONE-ACETAMINOPHEN 5-325 MG PO TABS: 1.0000 | ORAL_TABLET | Freq: Three times a day (TID) | ORAL | Status: AC | PRN

## 2011-05-14 MED ORDER — METHOCARBAMOL 750 MG PO TABS: 750.0000 mg | ORAL_TABLET | Freq: Three times a day (TID) | ORAL | Status: AC | PRN
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
## 2011-05-27 ENCOUNTER — Ambulatory Visit (INDEPENDENT_AMBULATORY_CARE_PROVIDER_SITE_OTHER): Payer: Medicare Other | Admitting: Internal Medicine

## 2011-05-27 ENCOUNTER — Other Ambulatory Visit (INDEPENDENT_AMBULATORY_CARE_PROVIDER_SITE_OTHER): Payer: Medicare Other

## 2011-05-27 ENCOUNTER — Other Ambulatory Visit: Payer: Self-pay | Admitting: Internal Medicine

## 2011-05-27 DIAGNOSIS — E785 Hyperlipidemia, unspecified: Secondary | ICD-10-CM

## 2011-05-27 DIAGNOSIS — IMO0001 Reserved for inherently not codable concepts without codable children: Secondary | ICD-10-CM

## 2011-05-27 DIAGNOSIS — M791 Myalgia, unspecified site: Secondary | ICD-10-CM

## 2011-05-27 LAB — LIPID PANEL
Cholesterol: 152 mg/dL (ref 0–200)
Total CHOL/HDL Ratio: 3

## 2011-05-27 LAB — LDL CHOLESTEROL, DIRECT: Direct LDL: 79 mg/dL

## 2011-05-27 NOTE — Patient Instructions (Signed)
Muscle pain an aches just slow to recover because you are an old man. Go ahead and take the methocarbamol for muscle spasm. For aches and pain take aleve 1 0r 2 tablets twice a day. Watch out for GI distress - pain, nausea, change in bowel habit or color of stool.   Depression - best to fix the situation but if you have to deal with it it may be helpful to see a therapist to develop better coping mechanisms. Call [phone] to get an appointment with one of our therapist for short term problem oriented counseling.
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
## 2011-05-30 NOTE — Assessment & Plan Note (Signed)
Lab Results  Component Value Date   CHOL 152 05/27/2011   HDL 48.30 05/27/2011   LDLCALC 91 03/01/2011   LDLDIRECT 79.0 05/27/2011   TRIG 213.0* 05/27/2011   CHOLHDL 3 05/27/2011   Good control on present regimen - no change need.

## 2011-06-02 ENCOUNTER — Encounter: Payer: Self-pay | Admitting: Internal Medicine

## 2011-06-22 ENCOUNTER — Telehealth: Payer: Self-pay

## 2011-06-22 MED ORDER — NAPROXEN SODIUM 550 MG PO TABS: 550.0000 mg | ORAL_TABLET | Freq: Every day | ORAL | Status: DC

## 2011-06-22 MED ORDER — ATENOLOL 50 MG PO TABS: 50.0000 mg | ORAL_TABLET | Freq: Every day | ORAL | Status: DC

## 2011-06-22 NOTE — Telephone Encounter (Signed)
Ok to refill naproxen prn

## 2011-06-22 NOTE — Telephone Encounter (Signed)
Pt called requesting refill of Naproxen and Atenolol.  Atenolol has been refilled, okay to refill Naproxen?

## 2011-06-28 ENCOUNTER — Other Ambulatory Visit: Payer: Self-pay

## 2011-06-28 MED ORDER — OMEPRAZOLE 20 MG PO CPDR: 40.0000 mg | DELAYED_RELEASE_CAPSULE | Freq: Every day | ORAL | Status: DC

## 2011-07-02 ENCOUNTER — Other Ambulatory Visit: Payer: Self-pay | Admitting: Internal Medicine

## 2011-07-02 NOTE — Telephone Encounter (Signed)
Refused-filled on 01.21.13

## 2011-07-09 ENCOUNTER — Ambulatory Visit: Payer: Medicare Other | Admitting: Internal Medicine

## 2011-10-28 ENCOUNTER — Encounter: Payer: Self-pay | Admitting: Internal Medicine

## 2011-11-24 ENCOUNTER — Telehealth: Payer: Self-pay | Admitting: Internal Medicine

## 2011-11-24 NOTE — Telephone Encounter (Signed)
The pt called and is hoping to be worked in to discuss his back problems with you. He stated it would not take much of your time, but wants your advice on what to do next.  He prefers morning, so I worked him in at 8:45am.  If this is a bad time, i'll be happy to reschedule.  He is aware that I have to confirm with you before this is set.    Thanks!

## 2011-11-25 ENCOUNTER — Ambulatory Visit (INDEPENDENT_AMBULATORY_CARE_PROVIDER_SITE_OTHER): Payer: Medicare Other | Admitting: Internal Medicine

## 2011-11-25 ENCOUNTER — Ambulatory Visit (INDEPENDENT_AMBULATORY_CARE_PROVIDER_SITE_OTHER)
Admission: RE | Admit: 2011-11-25 | Discharge: 2011-11-25 | Disposition: A | Discharge status: Home / Self Care | Payer: Medicare Other | Source: Ambulatory Visit | Attending: Internal Medicine | Admitting: Internal Medicine

## 2011-11-25 DIAGNOSIS — M545 Low back pain: Secondary | ICD-10-CM

## 2011-11-25 NOTE — Patient Instructions (Addendum)
Low back pain - no findings to suggest a pinched nerve or spine damage. This appears to be low back strain pain that could come from many sources. Plan - tylenol or aleve for back pain, regular back exercises -see pamphlet, also go to YouTube.com and search low back pain and stretch.  Loose weight - smart food choices, portion size control and regular exercise. Plan to loose 1 lb a month.

## 2011-11-27 DIAGNOSIS — M545 Low back pain, unspecified: Secondary | ICD-10-CM | POA: Insufficient documentation
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
## 2011-11-27 NOTE — Assessment & Plan Note (Signed)
Back exam w/o radicular findings. Imaging reveal mild DDD/DJD.  Plan  Flex-stretch exercises  APAP as first line medications - 1,000 mg tid  Heat

## 2011-11-28 ENCOUNTER — Encounter: Payer: Self-pay | Admitting: Internal Medicine

## 2011-12-02 ENCOUNTER — Telehealth: Payer: Self-pay

## 2011-12-02 MED ORDER — PREDNISONE 10 MG PO TABS: 10.0000 mg | ORAL_TABLET | Freq: Every day | ORAL | Status: DC

## 2011-12-02 NOTE — Telephone Encounter (Deleted)
Caller: Skye/Patient; PCP: Illene Regulus; CB#: [phone]; ; ; Call regarding Rash Pt states that Dr. Debby Bud saw his Poison Ivy on his hands and wrist at last visit but no rx was given. He has been using OTC tx such as Hydrocortisone. He states area has blisters and is red. He was told to call back if he think he needed Prednisone rx. Pt requesting a rx. Emergent s/s of Poison Ivy protocol r/o. Pt to see provider within 24hrs. Pt requesting rx, message sent. He uses [facility] for his pharmacy. He also states he has questions about his Back X-Rays.

## 2011-12-02 NOTE — Telephone Encounter (Signed)
Prednisone burst and taper sent to drug store.  Letter with x-ray results mailed 6/24

## 2011-12-02 NOTE — Telephone Encounter (Signed)
Caller: Austin Torres/Patient; PCP: Illene Regulus; CB#: [phone]; ; ; Call regarding Rash/Hives; Pt states that Dr. Debby Bud saw his Poison Ivy on his hands and wrist at last visit but no rx was given. He has been using oTC tx such as Hydrocortisone. He states area has blisters and is red. He was told to call back if he think he needed Prednisone rx. Pt requesting a rx. Emergent s/s of Poison Ivy protocol r/o. Pt to see provider within 24hrs. Pt requesting rx, message sent. He uses [facility] for his pharmacy. He also states he has questions about his Back X-Rays.

## 2011-12-02 NOTE — Telephone Encounter (Signed)
Pt called c/o of poison ivy. Pt stated that he was previously Dx with same and told that if it reoccurred to call for refill of prednisone and hydrocortisone cream. Please advise
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
## 2011-12-06 NOTE — Telephone Encounter (Signed)
X-ray with chronic changes of disk disease and arthritic changes without acute injury. If he needs a call it will be tonight.

## 2011-12-07 NOTE — Telephone Encounter (Signed)
Called home number provided. No answer

## 2011-12-07 NOTE — Telephone Encounter (Signed)
Called - no ans. Voice mail not working

## 2012-01-10 ENCOUNTER — Encounter: Payer: Medicare Other | Admitting: Internal Medicine

## 2012-01-14 ENCOUNTER — Encounter: Payer: Medicare Other | Admitting: Internal Medicine

## 2012-01-31 ENCOUNTER — Other Ambulatory Visit: Payer: Self-pay | Admitting: Internal Medicine

## 2012-02-15 ENCOUNTER — Encounter: Payer: Self-pay | Admitting: Internal Medicine

## 2012-02-15 ENCOUNTER — Other Ambulatory Visit (INDEPENDENT_AMBULATORY_CARE_PROVIDER_SITE_OTHER): Payer: Medicare Other

## 2012-02-15 ENCOUNTER — Ambulatory Visit (INDEPENDENT_AMBULATORY_CARE_PROVIDER_SITE_OTHER): Payer: Medicare Other | Admitting: Internal Medicine

## 2012-02-15 VITALS — BP 150/70 | HR 50 | Temp 97.3°F | Resp 16 | Wt 175.0 lb

## 2012-02-15 DIAGNOSIS — G609 Hereditary and idiopathic neuropathy, unspecified: Secondary | ICD-10-CM

## 2012-02-15 DIAGNOSIS — I10 Essential (primary) hypertension: Secondary | ICD-10-CM

## 2012-02-15 DIAGNOSIS — E538 Deficiency of other specified B group vitamins: Secondary | ICD-10-CM

## 2012-02-15 DIAGNOSIS — E785 Hyperlipidemia, unspecified: Secondary | ICD-10-CM

## 2012-02-15 DIAGNOSIS — Z0000 Encounter for general adult medical examination without abnormal findings: Secondary | ICD-10-CM

## 2012-02-15 DIAGNOSIS — Z23 Encounter for immunization: Secondary | ICD-10-CM

## 2012-02-15 DIAGNOSIS — E291 Testicular hypofunction: Secondary | ICD-10-CM

## 2012-02-15 LAB — LIPID PANEL
Cholesterol: 239 mg/dL — ABNORMAL HIGH (ref 0–200)
HDL: 52.2 mg/dL (ref >39.00)
Total CHOL/HDL Ratio: 5
Triglycerides: 295 mg/dL — ABNORMAL HIGH (ref 0.0–149.0)

## 2012-02-15 LAB — COMPREHENSIVE METABOLIC PANEL
Alkaline Phosphatase: 51 U/L (ref 39–117)
CO2: 26 mEq/L (ref 19–32)
Creatinine, Ser: 1 mg/dL (ref 0.4–1.5)
GFR: 75.47 mL/min (ref >60.00)
Glucose, Bld: 105 mg/dL — ABNORMAL HIGH (ref 70–99)
Sodium: 138 mEq/L (ref 135–145)
Total Bilirubin: 0.9 mg/dL (ref 0.3–1.2)

## 2012-02-15 LAB — HEPATIC FUNCTION PANEL
ALT: 23 U/L (ref 0–53)
AST: 21 U/L (ref 0–37)
Alkaline Phosphatase: 51 U/L (ref 39–117)
Total Bilirubin: 0.9 mg/dL (ref 0.3–1.2)
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
## 2012-02-16 MED ORDER — LOSARTAN POTASSIUM 50 MG PO TABS: 50.0000 mg | ORAL_TABLET | Freq: Every day | ORAL | Status: DC

## 2012-02-16 NOTE — Assessment & Plan Note (Signed)
B12 = 167 which is low  Plan - should take B12 replacement

## 2012-02-16 NOTE — Assessment & Plan Note (Signed)
HDL is better than goal of 40 or higher; LDL is higher than goal of 130 or lower but less than mandatory treatment threshold of 160. In addition, LDL/HDL ratio is approximately 3 which puts him at average risk. Framingham cardiac risk calculation gives a 25% chance of a cardiac event in the next 10 years, considered moderate to high risk.  Plan Best recommendations: Stop smoking; better BP control; give reconsideration to use of medical therapy to reduce LDL cholesterol in addition to a low fat diet and regular aerobic exercise.

## 2012-02-16 NOTE — Assessment & Plan Note (Signed)
No repeat testosterone level. He is followed by urology and continues on testosterone replacement. He reports that he has a positive response and feels better on replacement.  Plan  Continue present regimen under the direction of urology.

## 2012-02-16 NOTE — Assessment & Plan Note (Signed)
May be combination of B12 deficiency and idiopathic neuropathy.  Plan -  B12 replacement  Continue Elavil at bedtime.

## 2012-02-16 NOTE — Assessment & Plan Note (Signed)
Interval history is benign. Physical exam is normal. Lab results are OK except low B12 and elevated LDL. He is current with colorectal cancer screening but due for follow-up in Fall '14. He has aged out for prostate cancer screening per ACU guidelines April '13. Immunizations are up to date.  In summary - a friendly and gregarious guy who is a little cavalier about health care prevention. He needs to follow recommendations for better BP management, consider treatment to reduce LDL, stop smoking and develop a better aerobic exercise program. He should return in 3-4 weeks for BP check on new medication.

## 2012-02-16 NOTE — Assessment & Plan Note (Addendum)
BP Readings from Last 3 Encounters:  02/15/12 150/70  05/27/11 162/80  05/14/11 152/76   Suboptimal control. On BB (atenolol).  Plan  Continue present medication  Add losartan 50 mg once a day (Rx to pharmacy)  Return for BP check in 2-3 weeks

## 2012-03-29 ENCOUNTER — Other Ambulatory Visit: Payer: Self-pay | Admitting: *Deleted

## 2012-03-29 MED ORDER — ATENOLOL 50 MG PO TABS: 50.0000 mg | ORAL_TABLET | Freq: Every day | ORAL | Status: DC

## 2012-04-20 ENCOUNTER — Other Ambulatory Visit: Payer: Self-pay | Admitting: Internal Medicine

## 2012-04-24 ENCOUNTER — Ambulatory Visit (INDEPENDENT_AMBULATORY_CARE_PROVIDER_SITE_OTHER): Payer: Medicare Other | Admitting: *Deleted

## 2012-04-24 VITALS — BP 140/80

## 2012-04-24 DIAGNOSIS — I10 Essential (primary) hypertension: Secondary | ICD-10-CM

## 2012-05-02 NOTE — Progress Notes (Signed)
Dr. Debby Bud is aware of pt's blood pressure check.

## 2012-07-17 ENCOUNTER — Other Ambulatory Visit: Payer: Self-pay | Admitting: Internal Medicine

## 2012-12-20 ENCOUNTER — Encounter: Payer: Self-pay | Admitting: Internal Medicine

## 2012-12-20 ENCOUNTER — Ambulatory Visit (INDEPENDENT_AMBULATORY_CARE_PROVIDER_SITE_OTHER): Payer: Medicare Other | Admitting: Internal Medicine

## 2012-12-20 DIAGNOSIS — T6391XA Toxic effect of contact with unspecified venomous animal, accidental (unintentional), initial encounter: Secondary | ICD-10-CM

## 2012-12-20 DIAGNOSIS — T63461A Toxic effect of venom of wasps, accidental (unintentional), initial encounter: Secondary | ICD-10-CM

## 2012-12-20 MED ORDER — METHYLPREDNISOLONE ACETATE 80 MG/ML IJ SUSP
80.0000 mg | Freq: Once | INTRAMUSCULAR | Status: AC
  Administered 2012-12-20: 80 mg via INTRAMUSCULAR

## 2012-12-21 NOTE — Progress Notes (Signed)
Subjective:    Patient ID: Austin Torres, male    DOB: 12/24/1933, 79 y.o.   MRN: [id number]  HPI Austin Torres is seen as an urgent walk-in. 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 He has not had any respiratory symptoms, no difficulty with swallow. He does not have a history of allergic reaction.  PMH, FamHx and SocHx reviewed for any changes and relevance. Current Outpatient Prescriptions on File Prior to Visit  Medication Sig Dispense Refill  . amitriptyline (ELAVIL) 25 MG tablet Take 25 mg by mouth at bedtime.        Marland Kitchen atenolol (TENORMIN) 50 MG tablet Take 1 tablet (50 mg total) by mouth daily.  90 tablet  3  . BABY ASPIRIN PO Take 81 mg by mouth.       . ezetimibe (ZETIA) 10 MG tablet Take 1 tablet (10 mg total) by mouth daily.  28 tablet  5  . finasteride (PROSCAR) 5 MG tablet Take 5 mg by mouth daily.        . hydrochlorothiazide (HYDRODIURIL) 25 MG tablet TAKE 1/2 TABLET BY MOUTH ONCE A DAY  100 tablet  0  . HYDROcodone-acetaminophen (NORCO/VICODIN) 5-325 MG per tablet Take 1 tablet by mouth every 6 (six) hours as needed. As needed for back pain      . loratadine (CLARITIN) 10 MG tablet Take 10 mg by mouth daily.        Marland Kitchen losartan (COZAAR) 50 MG tablet Take 1 tablet (50 mg total) by mouth daily.  30 tablet  3  . lovastatin (ALTOPREV) 60 MG 24 hr tablet Take 1 tablet (60 mg total) by mouth at bedtime.  30 tablet  11  . methocarbamol (ROBAXIN) 750 MG tablet Take 750 mg by mouth 3 (three) times daily.        . naproxen sodium (ANAPROX) 550 MG tablet Take 1 tablet (550 mg total) by mouth daily.  30 tablet  5  . omeprazole (PRILOSEC) 20 MG capsule TAKE 2 CAPSULES BY MOUTH DAILY  60 capsule  5  . predniSONE (DELTASONE) 10 MG tablet Take 1 tablet (10 mg total) by mouth daily. 3 tabs daily for 3 days; 2 tabs daily for 3 days; 1 tab daily for 6 days  21 tablet  0  . sildenafil (VIAGRA) 50 MG tablet Take 50  mg by mouth daily as needed.        . testosterone cypionate (DEPOTESTOTERONE CYPIONATE) 200 MG/ML injection Inject 200 mg into the muscle every 28 (twenty-eight) days.        . traMADol (ULTRAM) 50 MG tablet Take 50 mg by mouth every 6 (six) hours as needed. Maximum dose= 8 tablets per day       . vardenafil (LEVITRA) 10 MG tablet Take 10 mg by mouth daily as needed.        . diphenoxylate-atropine (LOMOTIL) 2.5-0.025 MG/5ML liquid Take 5 mLs by mouth 4 (four) times daily as needed.  60 mL  1   No current facility-administered medications on file prior to visit.      Review of Systems System review is negative for any constitutional, cardiac, pulmonary, GI or neuro symptoms or complaints other than as described in the HPI.     Objective:   Physical Exam Filed Vitals:   12/20/12 1159  BP: 170/90  Pulse: 69  Temp: 97.7 F (36.5 C)   Gen'l- WNWD white man in no  distress HEENT - swelling about the right orbit. C&S clear, lens is clear, vision is normal. No swelling of the tongue. No drooling Pulm - normal respirations Derm - erythema and swelling right orbit. Well tanned but question of rising erythema anterior chest wall         Assessment & Plan:  hymenoptra injury -  No respiratory problems but localized reaction is impressive and he is itching  Plan Depomedrol 80 mg IM administered right buttock  He is to take diphenhydramine 25 mg q 6  Call for any respiratory symptoms.

## 2013-01-17 ENCOUNTER — Encounter: Payer: Self-pay | Admitting: Gastroenterology

## 2013-01-19 ENCOUNTER — Encounter: Payer: Self-pay | Admitting: Gastroenterology

## 2013-02-15 ENCOUNTER — Ambulatory Visit (INDEPENDENT_AMBULATORY_CARE_PROVIDER_SITE_OTHER): Payer: Medicare Other

## 2013-02-15 ENCOUNTER — Encounter: Payer: Self-pay | Admitting: Internal Medicine

## 2013-02-15 DIAGNOSIS — Z23 Encounter for immunization: Secondary | ICD-10-CM

## 2013-03-05 ENCOUNTER — Other Ambulatory Visit: Payer: Self-pay | Admitting: Internal Medicine

## 2013-03-27 ENCOUNTER — Ambulatory Visit (AMBULATORY_SURGERY_CENTER): Payer: Self-pay | Admitting: *Deleted

## 2013-03-27 VITALS — Ht 66.0 in | Wt 176.0 lb

## 2013-03-27 DIAGNOSIS — Z1211 Encounter for screening for malignant neoplasm of colon: Secondary | ICD-10-CM

## 2013-03-27 MED ORDER — NA SULFATE-K SULFATE-MG SULF 17.5-3.13-1.6 GM/177ML PO SOLN: ORAL | Status: DC

## 2013-03-27 NOTE — Progress Notes (Signed)
No egg or soy allergy 

## 2013-03-28 ENCOUNTER — Encounter: Payer: Self-pay | Admitting: Gastroenterology

## 2013-04-10 ENCOUNTER — Encounter: Payer: Medicare Other | Admitting: Gastroenterology

## 2013-04-26 ENCOUNTER — Encounter: Payer: Medicare Other | Admitting: Gastroenterology

## 2013-05-08 ENCOUNTER — Encounter (HOSPITAL_COMMUNITY): Payer: Self-pay | Admitting: Emergency Medicine

## 2013-05-08 ENCOUNTER — Emergency Department (HOSPITAL_COMMUNITY)
Admission: EM | Admit: 2013-05-08 | Discharge: 2013-05-08 | Disposition: A | Discharge status: Home / Self Care | Payer: Medicare Other | Attending: Emergency Medicine | Admitting: Emergency Medicine

## 2013-05-08 ENCOUNTER — Emergency Department (HOSPITAL_COMMUNITY): Payer: Medicare Other

## 2013-05-08 DIAGNOSIS — Z85828 Personal history of other malignant neoplasm of skin: Secondary | ICD-10-CM | POA: Insufficient documentation

## 2013-05-08 DIAGNOSIS — E785 Hyperlipidemia, unspecified: Secondary | ICD-10-CM | POA: Insufficient documentation

## 2013-05-08 DIAGNOSIS — I10 Essential (primary) hypertension: Secondary | ICD-10-CM | POA: Insufficient documentation

## 2013-05-08 DIAGNOSIS — Z7982 Long term (current) use of aspirin: Secondary | ICD-10-CM | POA: Insufficient documentation

## 2013-05-08 DIAGNOSIS — K219 Gastro-esophageal reflux disease without esophagitis: Secondary | ICD-10-CM | POA: Insufficient documentation

## 2013-05-08 DIAGNOSIS — R110 Nausea: Secondary | ICD-10-CM | POA: Insufficient documentation

## 2013-05-08 DIAGNOSIS — R55 Syncope and collapse: Secondary | ICD-10-CM

## 2013-05-08 DIAGNOSIS — Z8639 Personal history of other endocrine, nutritional and metabolic disease: Secondary | ICD-10-CM | POA: Insufficient documentation

## 2013-05-08 DIAGNOSIS — Z79899 Other long term (current) drug therapy: Secondary | ICD-10-CM | POA: Insufficient documentation

## 2013-05-08 DIAGNOSIS — F17200 Nicotine dependence, unspecified, uncomplicated: Secondary | ICD-10-CM | POA: Insufficient documentation

## 2013-05-08 DIAGNOSIS — N400 Enlarged prostate without lower urinary tract symptoms: Secondary | ICD-10-CM | POA: Insufficient documentation

## 2013-05-08 LAB — BASIC METABOLIC PANEL
BUN: 20 mg/dL (ref 6–23)
Creatinine, Ser: 1.29 mg/dL (ref 0.50–1.35)
GFR calc non Af Amer: 52 mL/min — ABNORMAL LOW (ref >90)
Glucose, Bld: 160 mg/dL — ABNORMAL HIGH (ref 70–99)
Potassium: 3.6 mEq/L (ref 3.5–5.1)

## 2013-05-08 LAB — CBC WITH DIFFERENTIAL/PLATELET
Basophils Relative: 0 % (ref 0–1)
Eosinophils Absolute: 0.2 10*3/uL (ref 0.0–0.7)
Eosinophils Relative: 2 % (ref 0–5)
HCT: 40.2 % (ref 39.0–52.0)
Hemoglobin: 14 g/dL (ref 13.0–17.0)
Lymphs Abs: 1.7 10*3/uL (ref 0.7–4.0)
MCH: 32.8 pg (ref 26.0–34.0)
MCHC: 34.8 g/dL (ref 30.0–36.0)
MCV: 94.1 fL (ref 78.0–100.0)
Monocytes Absolute: 0.9 10*3/uL (ref 0.1–1.0)
Monocytes Relative: 9 % (ref 3–12)
Neutrophils Relative %: 72 % (ref 43–77)
RBC: 4.27 MIL/uL (ref 4.22–5.81)

## 2013-05-08 MED ORDER — SODIUM CHLORIDE 0.9 % IV BOLUS (SEPSIS)
1000.0000 mL | Freq: Once | INTRAVENOUS | Status: AC
  Administered 2013-05-08: 1000 mL via INTRAVENOUS

## 2013-05-08 NOTE — ED Notes (Signed)
Pt from [facility] via [facility].  Pt was seen for skin cancer removal earlier today.  Reported full loss consciousness after saying he was not feeling well and drank one glass of wine.  No trauma.  Only responsive on scene to painful stimuli.  Now A&O.  Pt in NAD. Denies pain.

## 2013-05-08 NOTE — ED Notes (Signed)
Pt A&Ox4 at discharge, steady gait, denies Nausea or light headedness at discharge, verbalizing no complaints at this time, thanking staff for care.

## 2013-05-08 NOTE — ED Notes (Signed)
Pt ambulated to restroom with slow, steady gait, denies light headedness.

## 2013-05-08 NOTE — ED Provider Notes (Signed)
CSN: [id number]     Arrival date & time 05/08/13  1858 History   First MD Initiated Contact with Patient 05/08/13 1912     Chief Complaint  Patient presents with  . Loss of Consciousness   (Consider location/radiation/quality/duration/timing/severity/associated sxs/prior Treatment) Patient is a 79 y.o. male presenting with syncope.  Loss of Consciousness Episode history:  Single Most recent episode:  Today Duration: unable to specify. Timing: once. Progression:  Resolved Chronicity:  New Context comment:  Skin biopsy on his left ear under local anesthesia earlier today. He states he had a bowl of soup after that and drank a glass of wine at a bar. He began to feel nauseated, so he stood up to leave the bar when he lost consciousness. Witnessed: yes   Relieved by:  Nothing Worsened by:  Nothing tried Ineffective treatments:  None tried Associated symptoms: nausea   Associated symptoms: no chest pain, no difficulty breathing, no fever, no shortness of breath and no vomiting     Past Medical History  Diagnosis Date  . Hyperlipidemia   . Hypertension   . Hypogonadism male   . BPH (benign prostatic hypertrophy)   . B12 deficiency   . Skin cancer   . GERD (gastroesophageal reflux disease)    Past Surgical History  Procedure Laterality Date  . Appendectomy    . Tonsillectomy    . Eye surgery      glaucoma procedure -both eyes  . Skin cancer excision      scalp   Family History  Problem Relation Age of Onset  . Heart disease Father   . Heart disease Brother     CAD/MI  . Cancer Neg Hx     no colon cancer, no prostate cancer  . Colon cancer Neg Hx   . Esophageal cancer Neg Hx   . Rectal cancer Neg Hx   . Stomach cancer Neg Hx    History  Substance Use Topics  . Smoking status: Current Every Day Smoker -- 1.00 packs/day    Types: Cigars  . Smokeless tobacco: Never Used  . Alcohol Use: 7.5 oz/week    15 drink(s) per week    Review of Systems  Constitutional:  Negative for fever.  HENT: Negative for congestion.   Respiratory: Negative for cough and shortness of breath.   Cardiovascular: Positive for syncope. Negative for chest pain.  Gastrointestinal: Positive for nausea. Negative for vomiting, abdominal pain and diarrhea.  All other systems reviewed and are negative.    Allergies  Review of patient's allergies indicates no known allergies.  Home Medications   Current Outpatient Rx  Name  Route  Sig  Dispense  Refill  . atenolol (TENORMIN) 50 MG tablet   Oral   Take 1 tablet (50 mg total) by mouth daily.   90 tablet   3   . BABY ASPIRIN PO   Oral   Take 81 mg by mouth.          . hydrochlorothiazide (HYDRODIURIL) 25 MG tablet      TAKE 1/2 TABLET BY MOUTH ONCE DAILY   90 tablet   3   . HYDROcodone-acetaminophen (NORCO/VICODIN) 5-325 MG per tablet   Oral   Take 1 tablet by mouth every 6 (six) hours as needed. As needed for back pain         . lovastatin (ALTOPREV) 60 MG 24 hr tablet   Oral   Take 1 tablet (60 mg total) by mouth at bedtime.   30  tablet   11   . omeprazole (PRILOSEC) 20 MG capsule               . tamsulosin (FLOMAX) 0.4 MG CAPS capsule               . Testosterone (ANDROGEL) 25 MG/2.5GM GEL   Transdermal   Place onto the skin. 2 pumps daily         . traMADol (ULTRAM) 50 MG tablet   Oral   Take 50 mg by mouth every 6 (six) hours as needed. Maximum dose= 8 tablets per day          . vardenafil (LEVITRA) 10 MG tablet   Oral   Take 10 mg by mouth daily as needed.           Marland Kitchen EXPIRED: diphenoxylate-atropine (LOMOTIL) 2.5-0.025 MG/5ML liquid   Oral   Take 5 mLs by mouth 4 (four) times daily as needed.   60 mL   1   . EXPIRED: losartan (COZAAR) 50 MG tablet   Oral   Take 1 tablet (50 mg total) by mouth daily.   30 tablet   3    BP 143/67  Pulse 54  Temp(Src) 98.7 F (37.1 C) (Oral)  Resp 16  SpO2 95% Physical Exam  Nursing note and vitals reviewed. Constitutional: He  is oriented to person, place, and time. He appears well-developed and well-nourished. No distress.  HENT:  Head: Normocephalic and atraumatic.  Mouth/Throat: Oropharynx is clear and moist.  Eyes: Conjunctivae are normal. Pupils are equal, round, and reactive to light. No scleral icterus.  Neck: Neck supple.  Cardiovascular: Normal rate, regular rhythm, normal heart sounds and intact distal pulses.   No murmur heard. Pulmonary/Chest: Effort normal and breath sounds normal. No stridor. No respiratory distress. He has no wheezes. He has no rales. He exhibits tenderness (mild tenderness to right lateral/posterior chest wall).  Abdominal: Soft. He exhibits no distension. There is no tenderness.  Musculoskeletal: Normal range of motion. He exhibits no edema.  Neurological: He is alert and oriented to person, place, and time.  Skin: Skin is warm and dry. No rash noted.  Psychiatric: He has a normal mood and affect. His behavior is normal.    ED Course  Procedures (including critical care time) Labs Review Labs Reviewed  CBC WITH DIFFERENTIAL - Abnormal; Notable for the following:    Platelets 149 (*)    All other components within normal limits  BASIC METABOLIC PANEL - Abnormal; Notable for the following:    Sodium 134 (*)    Glucose, Bld 160 (*)    GFR calc non Af Amer 52 (*)    GFR calc Af Amer 60 (*)    All other components within normal limits  TROPONIN I   Imaging Review Dg Chest 2 View  05/08/2013   CLINICAL DATA:  Hypertension.  Loss of consciousness.  EXAM: CHEST  2 VIEW  COMPARISON:  May 27, 2010  FINDINGS: The heart size and mediastinal contours are stable. The aorta is tortuous. Both lungs are clear. The visualized skeletal structures are stable.  IMPRESSION: No active cardiopulmonary disease.   Electronically Signed   By: Sherian Rein M.D.   On: 05/08/2013 21:32  All radiology studies independently viewed by me.     EKG Interpretation    Date/Time:  Tuesday May 08 2013 20:29:51 EST Ventricular Rate:  59 PR Interval:  195 QRS Duration: 135 QT Interval:  490 QTC Calculation: 485 R  Axis:   -6 Text Interpretation:  Sinus rhythm Right bundle branch block No old tracing to compare Confirmed by [facility]  MD, TREY ([id number]) on 05/08/2013 8:49:06 PM            MDM   1. Syncope    79 year old male with a syncopal episode. Earlier in the day he had a minor ear procedure under local anesthesia.  He went to a bar where he had a glass of wine. He said up to leave the bar, he suffered a syncopal episode. He had a prodrome of nausea and lightheadedness. His EKG shows a right bundle branch block which is consistent with a previous EKG. His lab tests were unremarkable. I think it is ectopy was most likely secondary to a combination of his procedure, mild dehydration, decreased by mouth intake today, and alcohol ingestion. I have a low suspicion for cardiogenic syncope. I do not think he needs admission for this, I did advise close PCP follow.  Candyce Churn, MD 05/09/13 [phone]

## 2013-05-10 ENCOUNTER — Encounter: Payer: Self-pay | Admitting: Internal Medicine

## 2013-05-10 ENCOUNTER — Ambulatory Visit (INDEPENDENT_AMBULATORY_CARE_PROVIDER_SITE_OTHER): Payer: Medicare Other | Admitting: Internal Medicine

## 2013-05-10 VITALS — BP 140/80 | HR 55 | Wt 176.8 lb

## 2013-05-10 DIAGNOSIS — R55 Syncope and collapse: Secondary | ICD-10-CM

## 2013-05-10 MED ORDER — TRAMADOL HCL 50 MG PO TABS: 50.0000 mg | ORAL_TABLET | Freq: Four times a day (QID) | ORAL | Status: DC | PRN

## 2013-05-10 NOTE — Progress Notes (Signed)
Pre visit review using our clinic review tool, if applicable. No additional management support is needed unless otherwise documented below in the visit note. 

## 2013-05-10 NOTE — Patient Instructions (Signed)
Post hospital follow up - I have reviewed the ED records and agree with the diagnosis of a vaso-vagal syncope: combination of the stress of the procedure, not eating, dehydration and then a simple feint. Labs and all were normal at [facility]. No additional testing is needed.  Blood pressure is looking good.   For the pain of the ear - tramadol 50 mg every 6 hours as needed.

## 2013-05-10 NOTE — Progress Notes (Signed)
Subjective:    Patient ID: Austin Torres, male    DOB: 1933/06/29, 79 y.o.   MRN: [id number]  HPI Austin Torres presents for ED follow up. He was seen at [facility] hospital Dec 2nd after a syncopal episode. He had not eaten that day, had a minor surgical procedure on the left ear, had not had much in the way of fluids and went to a cafe, had a sip or two of wine and stood up and passed out. His ED evaluation was unremarkable and he was d/c with diagnosis of vaso-vagal syncope. Since that time he has been fine with no neurologic symptoms. He does c/o pain at the site of lesion removal left ear.  PMH, FamHx and SocHx reviewed for any changes and relevance. Current Outpatient Prescriptions on File Prior to Visit  Medication Sig Dispense Refill  . atenolol (TENORMIN) 50 MG tablet Take 1 tablet (50 mg total) by mouth daily.  90 tablet  3  . BABY ASPIRIN PO Take 81 mg by mouth.       . hydrochlorothiazide (HYDRODIURIL) 25 MG tablet TAKE 1/2 TABLET BY MOUTH ONCE DAILY  90 tablet  3  . HYDROcodone-acetaminophen (NORCO/VICODIN) 5-325 MG per tablet Take 1 tablet by mouth every 6 (six) hours as needed. As needed for back pain      . lovastatin (ALTOPREV) 60 MG 24 hr tablet Take 1 tablet (60 mg total) by mouth at bedtime.  30 tablet  11  . omeprazole (PRILOSEC) 20 MG capsule       . tamsulosin (FLOMAX) 0.4 MG CAPS capsule       . Testosterone (ANDROGEL) 25 MG/2.5GM GEL Place onto the skin. 2 pumps daily      . traMADol (ULTRAM) 50 MG tablet Take 50 mg by mouth every 6 (six) hours as needed. Maximum dose= 8 tablets per day       . vardenafil (LEVITRA) 10 MG tablet Take 10 mg by mouth daily as needed.        . diphenoxylate-atropine (LOMOTIL) 2.5-0.025 MG/5ML liquid Take 5 mLs by mouth 4 (four) times daily as needed.  60 mL  1  . losartan (COZAAR) 50 MG tablet Take 1 tablet (50 mg total) by mouth daily.  30 tablet  3   No current facility-administered medications on file prior to visit.      Review of  Systems  System review is negative for any constitutional, cardiac, pulmonary, GI or neuro symptoms or complaints other than as described in the HPI.      Objective:   Physical Exam Filed Vitals:   05/10/13 1642  BP: 140/80  Pulse: 55   Wt Readings from Last 3 Encounters:  05/10/13 176 lb 12.8 oz (80.196 kg)  03/27/13 176 lb (79.833 kg)  12/20/12 174 lb 6.4 oz (79.107 kg)   Gen'l - WNWD man in no distress HEENT - bandaid at the top of his left ear. No visible exudate Cor 2+ radial pulse, RRR Neuro - awake and alert, CN II-XII grossly normal, normal gait and strength.       Assessment & Plan:  1. Vasovagal syncope - Post hospital follow up - I have reviewed the ED records and agree with the diagnosis of a vaso-vagal syncope: combination of the stress of the procedure, not eating, dehydration and then a simple feint. Labs and all were normal at [facility]. No additional testing is needed.  2. HTN     Blood pressure is looking good.  3. Post procedure ear pain      Plan   tramadol 50 mg every 6 hours as needed.

## 2013-05-11 ENCOUNTER — Other Ambulatory Visit: Payer: Self-pay | Admitting: Internal Medicine

## 2013-05-14 ENCOUNTER — Telehealth: Payer: Self-pay | Admitting: Gastroenterology

## 2013-05-14 NOTE — Telephone Encounter (Signed)
no

## 2013-05-15 ENCOUNTER — Encounter: Payer: Medicare Other | Admitting: Gastroenterology

## 2013-06-08 ENCOUNTER — Other Ambulatory Visit: Payer: Self-pay | Admitting: Internal Medicine

## 2013-06-08 NOTE — Telephone Encounter (Signed)
Lomotil has been called to pharmacy

## 2013-07-02 ENCOUNTER — Telehealth: Payer: Self-pay | Admitting: Gastroenterology

## 2013-07-02 ENCOUNTER — Telehealth: Payer: Self-pay

## 2013-07-02 NOTE — Telephone Encounter (Signed)
We generally stop routine colorectal cancer screening at 78-80.  If patient has multiple medical problems screening may be discontinued sooner.  I am not aware that Medicare will not pay for screening above age 78.  It is up to the patient whether or not to proceed.  If he would like to talk about it further he is welcome to come in for an office visit.

## 2013-07-02 NOTE — Telephone Encounter (Signed)
Patient walked in requesting a rx for Zostavax be sent to [facility]

## 2013-07-02 NOTE — Telephone Encounter (Signed)
Pt is scheduled for a colon 07/31/13. Pt wants to know if Dr. Deatra Ina really thinks he needs to have the colon based on his age, pt is 42. Pt states Medicare is not wanting to pay for the colon past the age of 59. Please advise.

## 2013-07-03 NOTE — Telephone Encounter (Signed)
I called and advised patient he has already had the shingles vaccine 12/2009 and it's a one time vaccine. So he does not need a rx sent to the pharmacy

## 2013-07-03 NOTE — Telephone Encounter (Signed)
Pt aware. Pt would like to discuss this further with Dr. Deatra Ina. Pt scheduled to see Dr. Deatra Ina Monday 07/09/13@9 :30am. Pt aware of appt.

## 2013-07-09 ENCOUNTER — Ambulatory Visit: Payer: Medicare Other | Admitting: Gastroenterology

## 2013-07-11 ENCOUNTER — Encounter: Payer: Self-pay | Admitting: Internal Medicine

## 2013-07-11 ENCOUNTER — Other Ambulatory Visit (INDEPENDENT_AMBULATORY_CARE_PROVIDER_SITE_OTHER): Payer: Medicare Other

## 2013-07-11 ENCOUNTER — Ambulatory Visit (INDEPENDENT_AMBULATORY_CARE_PROVIDER_SITE_OTHER): Payer: Medicare Other | Admitting: Internal Medicine

## 2013-07-11 VITALS — BP 168/86 | HR 70 | Temp 97.8°F | Wt 177.6 lb

## 2013-07-11 DIAGNOSIS — R1032 Left lower quadrant pain: Secondary | ICD-10-CM

## 2013-07-11 LAB — COMPREHENSIVE METABOLIC PANEL
ALBUMIN: 4.2 g/dL (ref 3.5–5.2)
ALT: 23 U/L (ref 0–53)
AST: 24 U/L (ref 0–37)
Alkaline Phosphatase: 56 U/L (ref 39–117)
BUN: 14 mg/dL (ref 6–23)
CALCIUM: 9.1 mg/dL (ref 8.4–10.5)
CHLORIDE: 102 meq/L (ref 96–112)
CO2: 26 meq/L (ref 19–32)
Creatinine, Ser: 1 mg/dL (ref 0.4–1.5)
GFR: 76.05 mL/min (ref >60.00)
GLUCOSE: 110 mg/dL — AB (ref 70–99)
POTASSIUM: 3.8 meq/L (ref 3.5–5.1)
Sodium: 137 mEq/L (ref 135–145)
Total Bilirubin: 1.4 mg/dL — ABNORMAL HIGH (ref 0.3–1.2)
Total Protein: 7.3 g/dL (ref 6.0–8.3)

## 2013-07-11 LAB — CBC WITH DIFFERENTIAL/PLATELET
Basophils Absolute: 0 10*3/uL (ref 0.0–0.1)
Basophils Relative: 0.4 % (ref 0.0–3.0)
EOS ABS: 0.4 10*3/uL (ref 0.0–0.7)
Eosinophils Relative: 3.9 % (ref 0.0–5.0)
HCT: 45.9 % (ref 39.0–52.0)
HEMOGLOBIN: 15.2 g/dL (ref 13.0–17.0)
LYMPHS PCT: 17 % (ref 12.0–46.0)
Lymphs Abs: 1.7 10*3/uL (ref 0.7–4.0)
MCHC: 33.2 g/dL (ref 30.0–36.0)
MCV: 93.6 fl (ref 78.0–100.0)
Monocytes Absolute: 0.7 10*3/uL (ref 0.1–1.0)
Monocytes Relative: 7.2 % (ref 3.0–12.0)
NEUTROS ABS: 6.9 10*3/uL (ref 1.4–7.7)
Neutrophils Relative %: 71.5 % (ref 43.0–77.0)
Platelets: 194 10*3/uL (ref 150.0–400.0)
RBC: 4.9 Mil/uL (ref 4.22–5.81)
RDW: 12.8 % (ref 11.5–14.6)
WBC: 9.7 10*3/uL (ref 4.5–10.5)

## 2013-07-11 LAB — AMYLASE: Amylase: 75 U/L (ref 27–131)

## 2013-07-11 LAB — LIPASE: Lipase: 16 U/L (ref 11.0–59.0)

## 2013-07-11 MED ORDER — AMOXICILLIN-POT CLAVULANATE 875-125 MG PO TABS: 1.0000 | ORAL_TABLET | Freq: Two times a day (BID) | ORAL | Status: DC

## 2013-07-11 NOTE — Patient Instructions (Signed)
Abdominal pain with diarrhea - non acute abdomen w/o signs of peritonitis. Question of diverticulitis with LLQ abdomen  Vs gastro-enteritis.  Plan Lab: CBCD, comp panel, amylase, lipase  Augmentin 875 mg bid.  Bulk laxative twice a day  BRAT diet  Hydrate  Tylenol for pain.

## 2013-07-11 NOTE — Progress Notes (Signed)
Pre visit review using our clinic review tool, if applicable. No additional management support is needed unless otherwise documented below in the visit note. 

## 2013-07-11 NOTE — Progress Notes (Signed)
Subjective:    Patient ID: Austin Torres, male    DOB: 01-31-1934, 80 y.o.   MRN: [id number]  HPI Austin Torres presents for an evaluation of 2 days of GI symptoms: he had upper GI pain, with eating, diarrhea with up to 10 stools a day. The pain has now migrated to lower abdomen. He has been taking lomotil which has not helped. No fever, no rigors. The pain is constant. He has increased eructation. He does admit to eating a lot of party food Sunday just prior to his symptoms started. He has been around friends who are sick - no specifics. He rates the pain as a 8/10.  Past Medical History  Diagnosis Date  . Hyperlipidemia   . Hypertension   . Hypogonadism male   . BPH (benign prostatic hypertrophy)   . B12 deficiency   . Skin cancer   . GERD (gastroesophageal reflux disease)    Past Surgical History  Procedure Laterality Date  . Appendectomy    . Tonsillectomy    . Eye surgery      glaucoma procedure -both eyes  . Skin cancer excision      scalp   Family History  Problem Relation Age of Onset  . Heart disease Father   . Heart disease Brother     CAD/MI  . Cancer Neg Hx     no colon cancer, no prostate cancer  . Colon cancer Neg Hx   . Esophageal cancer Neg Hx   . Rectal cancer Neg Hx   . Stomach cancer Neg Hx    History   Social History  . Marital Status: Married    Spouse Name: N/A    Number of Children: 3  . Years of Education: 16   Occupational History  .     Social History Main Topics  . Smoking status: Current Every Day Smoker -- 1.00 packs/day    Types: Cigars  . Smokeless tobacco: Never Used  . Alcohol Use: 7.5 oz/week    15 drink(s) per week  . Drug Use: No  . Sexual Activity: Yes    Partners: Female   Other Topics Concern  . Not on file   Social History Narrative   HSG, [employer] [place]. Married '61 2 dtrs - '68, 69; 2 sons - '72, '78. 4 grandchildren.   Work - Magazine features editor- Engineer, maintenance). Marriage is stable. He is  retired. ACP - No CPR, No mechanical ventilation for more than short-term. No futile or heroic measures.     Current Outpatient Prescriptions on File Prior to Visit  Medication Sig Dispense Refill  . atenolol (TENORMIN) 50 MG tablet Take 1 tablet (50 mg total) by mouth daily.  90 tablet  3  . BABY ASPIRIN PO Take 81 mg by mouth.       . diphenoxylate-atropine (LOMOTIL) 2.5-0.025 MG per tablet TAKE 1 TABLET BY MOUTH 4 TIMES DAILY  60 tablet  1  . hydrochlorothiazide (HYDRODIURIL) 25 MG tablet TAKE 1/2 TABLET BY MOUTH ONCE DAILY  90 tablet  3  . HYDROcodone-acetaminophen (NORCO/VICODIN) 5-325 MG per tablet Take 1 tablet by mouth every 6 (six) hours as needed. As needed for back pain      . losartan (COZAAR) 50 MG tablet TAKE ONE (1) TABLET BY MOUTH EVERY DAY  30 tablet  5  . lovastatin (ALTOPREV) 60 MG 24 hr tablet Take 1 tablet (60 mg total) by mouth at bedtime.  30 tablet  11  . omeprazole (PRILOSEC) 20 MG capsule       . tamsulosin (FLOMAX) 0.4 MG CAPS capsule       . Testosterone (ANDROGEL) 25 MG/2.5GM GEL Place onto the skin. 2 pumps daily      . traMADol (ULTRAM) 50 MG tablet Take 50 mg by mouth every 6 (six) hours as needed. Maximum dose= 8 tablets per day       . traMADol (ULTRAM) 50 MG tablet Take 1 tablet (50 mg total) by mouth every 6 (six) hours as needed.  30 tablet  0  . vardenafil (LEVITRA) 10 MG tablet Take 10 mg by mouth daily as needed.        . diphenoxylate-atropine (LOMOTIL) 2.5-0.025 MG/5ML liquid Take 5 mLs by mouth 4 (four) times daily as needed.  60 mL  1   No current facility-administered medications on file prior to visit.      Review of Systems System review is negative for any constitutional, cardiac, pulmonary, GI or neuro symptoms or complaints other than as described in the HPI.     Objective:   Physical Exam Filed Vitals:   07/11/13 0905  BP: 168/86  Pulse: 70  Temp: 97.8 F (36.6 C)   gen'l - WNWD man in no acute distress HEENT- C&S clear Cor -  2+ radial, RRR, w/o M/R/G Pulm - CTAP Abd - BS+ x 4 quad, no HSM, not tender in the epigastrium. Most tender at the LLQ, no rebound tenderness. Neuro - non-focal.       Assessment & Plan:  Abdominal pain with diarrhea - non acute abdomen w/o signs of peritonitis. Question of diverticulitis with LLQ abdomen  Vs gastro-enteritis.  Plan Lab: CBCD, comp panel, amylase, lipase  Augmentin 875 mg bid.  Bulk laxative twice a day  BRAT diet  Hydrate  Tylenol for pain.   Addendum - Cmet - normal, amylase and lipase normal;            CBC normal  Favor gastro-enteritis for diagnosis.  Tele follow up 07/12/13

## 2013-07-12 ENCOUNTER — Telehealth: Payer: Self-pay

## 2013-07-12 NOTE — Telephone Encounter (Signed)
Patient has been advised

## 2013-07-12 NOTE — Telephone Encounter (Signed)
Message copied by Shelly Coss on Thu Jul 12, 2013  8:15 AM ------      Message from: Adella Hare E      Created: Thu Jul 12, 2013  5:00 AM       Please call this AM: labs were all normal. Working diagnosis viral gastroenteritis. He may hold the augmentin - it will keep on the medicine shelf.            Hope he is better: hydrating as instructed.             Thank you ------

## 2013-07-13 ENCOUNTER — Telehealth: Payer: Self-pay | Admitting: Internal Medicine

## 2013-07-13 NOTE — Telephone Encounter (Signed)
07/13/2013  Pt is requesting results from previous lab visit.  Gave pt info for MyChart, but he says he wants the results sooner than he can get signed up.

## 2013-07-31 ENCOUNTER — Encounter: Payer: Medicare Other | Admitting: Gastroenterology

## 2013-08-17 ENCOUNTER — Encounter: Payer: Self-pay | Admitting: Internal Medicine

## 2013-09-04 ENCOUNTER — Other Ambulatory Visit: Payer: Self-pay | Admitting: Internal Medicine

## 2013-09-14 ENCOUNTER — Telehealth: Payer: Self-pay | Admitting: Internal Medicine

## 2013-09-14 NOTE — Telephone Encounter (Signed)
Ok Thx 

## 2013-09-14 NOTE — Telephone Encounter (Signed)
Pt request to transfer from Dr. Linda Hedges to Dr. Alain Marion. Pt stated that he is a long time pt of Dr. Linda Hedges and would like to remain with the same location. Please advise.

## 2013-09-17 NOTE — Telephone Encounter (Signed)
Left detail massage of Dr. Camila Li response.

## 2013-09-27 ENCOUNTER — Encounter: Payer: Self-pay | Admitting: Gastroenterology

## 2013-10-22 ENCOUNTER — Ambulatory Visit (INDEPENDENT_AMBULATORY_CARE_PROVIDER_SITE_OTHER): Payer: Medicare Other | Admitting: Internal Medicine

## 2013-10-22 ENCOUNTER — Encounter: Payer: Self-pay | Admitting: Internal Medicine

## 2013-10-22 ENCOUNTER — Telehealth: Payer: Self-pay | Admitting: Internal Medicine

## 2013-10-22 ENCOUNTER — Other Ambulatory Visit (INDEPENDENT_AMBULATORY_CARE_PROVIDER_SITE_OTHER): Payer: Medicare Other

## 2013-10-22 VITALS — BP 198/96 | HR 76 | Temp 97.7°F | Resp 16 | Ht 67.0 in | Wt 175.0 lb

## 2013-10-22 DIAGNOSIS — N400 Enlarged prostate without lower urinary tract symptoms: Secondary | ICD-10-CM

## 2013-10-22 DIAGNOSIS — M545 Low back pain, unspecified: Secondary | ICD-10-CM

## 2013-10-22 DIAGNOSIS — E291 Testicular hypofunction: Secondary | ICD-10-CM

## 2013-10-22 DIAGNOSIS — I10 Essential (primary) hypertension: Secondary | ICD-10-CM

## 2013-10-22 DIAGNOSIS — Z0000 Encounter for general adult medical examination without abnormal findings: Secondary | ICD-10-CM

## 2013-10-22 DIAGNOSIS — E785 Hyperlipidemia, unspecified: Secondary | ICD-10-CM

## 2013-10-22 DIAGNOSIS — M26629 Arthralgia of temporomandibular joint, unspecified side: Secondary | ICD-10-CM | POA: Diagnosis not present

## 2013-10-22 DIAGNOSIS — E538 Deficiency of other specified B group vitamins: Secondary | ICD-10-CM | POA: Diagnosis not present

## 2013-10-22 DIAGNOSIS — R197 Diarrhea, unspecified: Secondary | ICD-10-CM | POA: Insufficient documentation

## 2013-10-22 LAB — CBC WITH DIFFERENTIAL/PLATELET
BASOS PCT: 0.4 % (ref 0.0–3.0)
Basophils Absolute: 0 10*3/uL (ref 0.0–0.1)
EOS PCT: 4 % (ref 0.0–5.0)
Eosinophils Absolute: 0.3 10*3/uL (ref 0.0–0.7)
HEMATOCRIT: 44.3 % (ref 39.0–52.0)
HEMOGLOBIN: 15.1 g/dL (ref 13.0–17.0)
LYMPHS ABS: 2 10*3/uL (ref 0.7–4.0)
Lymphocytes Relative: 25.4 % (ref 12.0–46.0)
MCHC: 34.1 g/dL (ref 30.0–36.0)
MCV: 91 fl (ref 78.0–100.0)
MONOS PCT: 6.4 % (ref 3.0–12.0)
Monocytes Absolute: 0.5 10*3/uL (ref 0.1–1.0)
Neutro Abs: 5.1 10*3/uL (ref 1.4–7.7)
Neutrophils Relative %: 63.8 % (ref 43.0–77.0)
Platelets: 218 10*3/uL (ref 150.0–400.0)
RBC: 4.87 Mil/uL (ref 4.22–5.81)
RDW: 13.8 % (ref 11.5–15.5)
WBC: 8 10*3/uL (ref 4.0–10.5)

## 2013-10-22 LAB — HEPATIC FUNCTION PANEL
ALBUMIN: 4.2 g/dL (ref 3.5–5.2)
ALT: 20 U/L (ref 0–53)
AST: 20 U/L (ref 0–37)
Alkaline Phosphatase: 52 U/L (ref 39–117)
Bilirubin, Direct: 0.1 mg/dL (ref 0.0–0.3)
TOTAL PROTEIN: 7 g/dL (ref 6.0–8.3)
Total Bilirubin: 0.8 mg/dL (ref 0.2–1.2)

## 2013-10-22 LAB — LIPID PANEL
Cholesterol: 236 mg/dL — ABNORMAL HIGH (ref 0–200)
HDL: 52.1 mg/dL (ref >39.00)
LDL Cholesterol: 159 mg/dL — ABNORMAL HIGH (ref 0–99)
Total CHOL/HDL Ratio: 5
Triglycerides: 124 mg/dL (ref 0.0–149.0)
VLDL: 24.8 mg/dL (ref 0.0–40.0)

## 2013-10-22 LAB — TESTOSTERONE: Testosterone: 251.33 ng/dL — ABNORMAL LOW (ref 300.00–890.00)

## 2013-10-22 LAB — BASIC METABOLIC PANEL
BUN: 14 mg/dL (ref 6–23)
CO2: 27 mEq/L (ref 19–32)
Calcium: 9.2 mg/dL (ref 8.4–10.5)
Chloride: 102 mEq/L (ref 96–112)
Creatinine, Ser: 1 mg/dL (ref 0.4–1.5)
GFR: 79.62 mL/min (ref >60.00)
GLUCOSE: 101 mg/dL — AB (ref 70–99)
Potassium: 4.2 mEq/L (ref 3.5–5.1)
Sodium: 137 mEq/L (ref 135–145)

## 2013-10-22 LAB — VITAMIN B12: Vitamin B-12: 277 pg/mL (ref 211–911)

## 2013-10-22 LAB — TSH: TSH: 2.13 u[IU]/mL (ref 0.35–4.50)

## 2013-10-22 LAB — PSA: PSA: 3.25 ng/mL (ref 0.10–4.00)

## 2013-10-22 LAB — CK: CK TOTAL: 116 U/L (ref 7–232)

## 2013-10-22 MED ORDER — VITAMIN B-12 1000 MCG SL SUBL: 1.0000 | SUBLINGUAL_TABLET | Freq: Every day | SUBLINGUAL | Status: DC

## 2013-10-22 MED ORDER — DIPHENOXYLATE-ATROPINE 2.5-0.025 MG PO TABS: 1.0000 | ORAL_TABLET | Freq: Four times a day (QID) | ORAL | Status: DC | PRN

## 2013-10-22 MED ORDER — LORATADINE 10 MG PO TABS: 10.0000 mg | ORAL_TABLET | Freq: Every day | ORAL | Status: DC

## 2013-10-22 MED ORDER — VITAMIN D 1000 UNITS PO TABS: 1000.0000 [IU] | ORAL_TABLET | Freq: Every day | ORAL | Status: AC

## 2013-10-22 MED ORDER — VARDENAFIL HCL 10 MG PO TABS: 10.0000 mg | ORAL_TABLET | Freq: Every day | ORAL | Status: DC | PRN

## 2013-10-22 MED ORDER — LOSARTAN POTASSIUM-HCTZ 100-25 MG PO TABS
1.0000 | ORAL_TABLET | Freq: Every day | ORAL | Status: DC
Start: 2013-10-22 — End: 2014-11-05

## 2013-10-22 NOTE — Assessment & Plan Note (Signed)
Lomotil prn Gluten free trial (no wheat products) for 4-6 weeks. OK to use gluten-free bread and gluten-free pasta.  Milk free trial (no milk, ice cream, cheese and yogurt) for 4-6 weeks. OK to use almond, coconut, rice or soy milk. "Almond breeze" brand tastes good.

## 2013-10-22 NOTE — Assessment & Plan Note (Signed)
Risks associated with treatment noncompliance were discussed. Compliance was encouraged. Restart Rx  

## 2013-10-22 NOTE — Assessment & Plan Note (Signed)
Myalgias Hold Lovastatin

## 2013-10-22 NOTE — Telephone Encounter (Signed)
Relevant patient education assigned to patient using Emmi. ° °

## 2013-10-22 NOTE — Patient Instructions (Addendum)
Gluten free trial (no wheat products) for 4-6 weeks. OK to use gluten-free bread and gluten-free pasta.  Milk free trial (no milk, ice cream, cheese and yogurt) for 4-6 weeks. OK to use almond, coconut, rice milk. "Almond breeze" brand tastes good.  

## 2013-10-22 NOTE — Progress Notes (Signed)
Subjective:    HPI  Pt is changing from Dr Linda Hedges    The patient is here for a wellness exam. The patient has been doing well overall without major physical or psychological issues going on lately.  The patient needs to address  chronic hypertension that has not been taking his BP medicines; to address chronic  BPH controlled with medicines as well; and to address allergies controlled with medical treatment; hypogonadism on Rx x 1 year, ED    Review of Systems  Constitutional: Negative for appetite change, fatigue and unexpected weight change.  HENT: Negative for congestion, nosebleeds, sneezing, sore throat and trouble swallowing.   Eyes: Negative for itching and visual disturbance.  Respiratory: Negative for cough.   Cardiovascular: Negative for chest pain, palpitations and leg swelling.  Gastrointestinal: Negative for nausea, diarrhea, blood in stool and abdominal distention.  Genitourinary: Negative for frequency and hematuria.  Musculoskeletal: Negative for back pain, gait problem, joint swelling and neck pain.  Skin: Negative for rash.  Neurological: Negative for dizziness, tremors, speech difficulty and weakness.  Psychiatric/Behavioral: Negative for suicidal ideas, sleep disturbance, dysphoric mood and agitation. The patient is not nervous/anxious.        Objective:   Physical Exam  Constitutional: He is oriented to person, place, and time. He appears well-developed and well-nourished. No distress.  HENT:  Head: Normocephalic and atraumatic.  Right Ear: External ear normal.  Left Ear: External ear normal.  Nose: Nose normal.  Mouth/Throat: Oropharynx is clear and moist. No oropharyngeal exudate.  Eyes: Conjunctivae and EOM are normal. Pupils are equal, round, and reactive to light. Right eye exhibits no discharge. Left eye exhibits no discharge. No scleral icterus.  Neck: Normal range of motion. Neck supple. No JVD present. No tracheal deviation present. No  thyromegaly present.  Cardiovascular: Normal rate, regular rhythm, normal heart sounds and intact distal pulses.  Exam reveals no gallop and no friction rub.   No murmur heard. Pulmonary/Chest: Effort normal and breath sounds normal. No stridor. No respiratory distress. He has no wheezes. He has no rales. He exhibits no tenderness.  Abdominal: Soft. Bowel sounds are normal. He exhibits no distension and no mass. There is no tenderness. There is no rebound and no guarding.  Genitourinary: Rectum normal, prostate normal and penis normal. Guaiac negative stool. No penile tenderness.  Musculoskeletal: Normal range of motion. He exhibits no edema and no tenderness.  Lymphadenopathy:    He has no cervical adenopathy.  Neurological: He is alert and oriented to person, place, and time. He has normal reflexes. No cranial nerve deficit. He exhibits normal muscle tone. Coordination normal.  Skin: Skin is warm and dry. No rash noted. He is not diaphoretic. No erythema. No pallor.  Psychiatric: He has a normal mood and affect. His behavior is normal. Judgment and thought content normal.     Lab Results  Component Value Date   WBC 9.7 07/11/2013   HGB 15.2 07/11/2013   HCT 45.9 07/11/2013   PLT 194.0 07/11/2013   GLUCOSE 110* 07/11/2013   CHOL 239* 02/15/2012   TRIG 295.0* 02/15/2012   HDL 52.20 02/15/2012   LDLDIRECT 155.3 02/15/2012   LDLCALC 91 03/01/2011   ALT 23 07/11/2013   AST 24 07/11/2013   NA 137 07/11/2013   K 3.8 07/11/2013   CL 102 07/11/2013   CREATININE 1.0 07/11/2013   BUN 14 07/11/2013   CO2 26 07/11/2013   TSH 2.70 01/08/2011   PSA 1.96 01/08/2011  Assessment & Plan:

## 2013-10-22 NOTE — Assessment & Plan Note (Signed)
On Tramadol prn per Dr Nelva Bush

## 2013-10-22 NOTE — Assessment & Plan Note (Signed)
Labs

## 2013-10-22 NOTE — Assessment & Plan Note (Signed)
NAS D/c Allegra D Re-start BP Rx Call if BP is up

## 2013-10-22 NOTE — Progress Notes (Signed)
Pre visit review using our clinic review tool, if applicable. No additional management support is needed unless otherwise documented below in the visit note. 

## 2013-10-23 ENCOUNTER — Telehealth: Payer: Self-pay | Admitting: Internal Medicine

## 2013-10-23 NOTE — Telephone Encounter (Signed)
Relevant patient education assigned to patient using Emmi. ° °

## 2013-10-25 ENCOUNTER — Telehealth: Payer: Self-pay

## 2013-10-25 NOTE — Telephone Encounter (Signed)
Per pharmacy, insurance will not cover levitra, they are requesting that MD switch to sildenafil 20 mg. Please send new rx if ok. Thanks

## 2013-10-26 MED ORDER — SILDENAFIL CITRATE 20 MG PO TABS: 60.0000 mg | ORAL_TABLET | Freq: Every day | ORAL | Status: DC | PRN

## 2013-10-26 NOTE — Addendum Note (Signed)
Addended by: Cassandria Anger on: 10/26/2013 07:20 AM   Modules accepted: Orders, Medications

## 2013-10-26 NOTE — Telephone Encounter (Signed)
OK 60 mg/d prn Thx

## 2013-11-05 ENCOUNTER — Telehealth: Payer: Self-pay | Admitting: Internal Medicine

## 2013-11-05 MED ORDER — DOXYCYCLINE HYCLATE 100 MG PO TABS: 100.0000 mg | ORAL_TABLET | Freq: Two times a day (BID) | ORAL | Status: DC

## 2013-11-05 NOTE — Telephone Encounter (Signed)
Ok Doxy - done. Avoid sun exposure Thx

## 2013-11-05 NOTE — Telephone Encounter (Signed)
LM with wife for him to call the pharmacy.

## 2013-11-05 NOTE — Telephone Encounter (Signed)
Pt got bitten by a tick.  The place is red and swollen just a little.  The pharmacy told him to get an RX for doxycline 100 mg.  Can this be called in to [facility].

## 2014-02-21 ENCOUNTER — Ambulatory Visit (INDEPENDENT_AMBULATORY_CARE_PROVIDER_SITE_OTHER): Payer: Medicare Other

## 2014-02-21 DIAGNOSIS — Z23 Encounter for immunization: Secondary | ICD-10-CM

## 2014-03-21 ENCOUNTER — Telehealth: Payer: Self-pay | Admitting: Internal Medicine

## 2014-03-21 NOTE — Telephone Encounter (Signed)
Pt came by office to request to speak with Dr Alain Marion or Marzetta Board directly. Pt has a variety of questions in regard to his blood type and labs. Please contact pt when request is reviewed.

## 2014-03-22 NOTE — Telephone Encounter (Signed)
Left mess for patient to call back.  

## 2014-03-26 NOTE — Telephone Encounter (Signed)
I'm not in the office this week Thx

## 2014-06-04 ENCOUNTER — Other Ambulatory Visit: Payer: Self-pay

## 2014-06-04 MED ORDER — OMEPRAZOLE 20 MG PO CPDR: 40.0000 mg | DELAYED_RELEASE_CAPSULE | Freq: Every day | ORAL | Status: DC

## 2014-08-19 DIAGNOSIS — R351 Nocturia: Secondary | ICD-10-CM | POA: Diagnosis not present

## 2014-08-19 DIAGNOSIS — R310 Gross hematuria: Secondary | ICD-10-CM | POA: Diagnosis not present

## 2014-08-19 DIAGNOSIS — R361 Hematospermia: Secondary | ICD-10-CM | POA: Diagnosis not present

## 2014-08-22 DIAGNOSIS — C44519 Basal cell carcinoma of skin of other part of trunk: Secondary | ICD-10-CM | POA: Diagnosis not present

## 2014-08-22 DIAGNOSIS — Z85820 Personal history of malignant melanoma of skin: Secondary | ICD-10-CM | POA: Diagnosis not present

## 2014-08-22 DIAGNOSIS — L570 Actinic keratosis: Secondary | ICD-10-CM | POA: Diagnosis not present

## 2014-08-22 DIAGNOSIS — D3614 Benign neoplasm of peripheral nerves and autonomic nervous system of thorax: Secondary | ICD-10-CM | POA: Diagnosis not present

## 2014-08-22 DIAGNOSIS — D225 Melanocytic nevi of trunk: Secondary | ICD-10-CM | POA: Diagnosis not present

## 2014-08-22 DIAGNOSIS — D485 Neoplasm of uncertain behavior of skin: Secondary | ICD-10-CM | POA: Diagnosis not present

## 2014-08-22 DIAGNOSIS — Z85828 Personal history of other malignant neoplasm of skin: Secondary | ICD-10-CM | POA: Diagnosis not present

## 2014-08-28 DIAGNOSIS — K7689 Other specified diseases of liver: Secondary | ICD-10-CM | POA: Diagnosis not present

## 2014-08-28 DIAGNOSIS — R310 Gross hematuria: Secondary | ICD-10-CM | POA: Diagnosis not present

## 2014-08-28 DIAGNOSIS — N281 Cyst of kidney, acquired: Secondary | ICD-10-CM | POA: Diagnosis not present

## 2014-08-28 DIAGNOSIS — K5730 Diverticulosis of large intestine without perforation or abscess without bleeding: Secondary | ICD-10-CM | POA: Diagnosis not present

## 2014-08-28 DIAGNOSIS — N134 Hydroureter: Secondary | ICD-10-CM | POA: Diagnosis not present

## 2014-09-16 DIAGNOSIS — N138 Other obstructive and reflux uropathy: Secondary | ICD-10-CM | POA: Diagnosis not present

## 2014-09-16 DIAGNOSIS — R310 Gross hematuria: Secondary | ICD-10-CM | POA: Diagnosis not present

## 2014-09-16 DIAGNOSIS — N401 Enlarged prostate with lower urinary tract symptoms: Secondary | ICD-10-CM | POA: Diagnosis not present

## 2014-09-16 DIAGNOSIS — N3941 Urge incontinence: Secondary | ICD-10-CM | POA: Diagnosis not present

## 2014-09-25 DIAGNOSIS — C44519 Basal cell carcinoma of skin of other part of trunk: Secondary | ICD-10-CM | POA: Diagnosis not present

## 2014-10-17 DIAGNOSIS — S335XXS Sprain of ligaments of lumbar spine, sequela: Secondary | ICD-10-CM | POA: Diagnosis not present

## 2014-10-17 DIAGNOSIS — S134XXS Sprain of ligaments of cervical spine, sequela: Secondary | ICD-10-CM | POA: Diagnosis not present

## 2014-10-18 ENCOUNTER — Other Ambulatory Visit: Payer: Self-pay | Admitting: Internal Medicine

## 2014-10-18 NOTE — Telephone Encounter (Signed)
Patient has also called about diphenoxylate-atropine (LOMOTIL) 2.5-0.025 MG per tablet [[id number]]  . He is suffering from diarrhea and was hoping to get some called in this evening

## 2014-10-24 MED ORDER — DIPHENOXYLATE-ATROPINE 2.5-0.025 MG PO TABS: 1.0000 | ORAL_TABLET | Freq: Four times a day (QID) | ORAL | Status: DC | PRN

## 2014-10-24 NOTE — Addendum Note (Signed)
Addended by: Lowella Dandy on: 10/24/2014 10:24 AM   Modules accepted: Orders

## 2014-11-05 ENCOUNTER — Other Ambulatory Visit: Payer: Self-pay | Admitting: Internal Medicine

## 2014-12-04 ENCOUNTER — Other Ambulatory Visit: Payer: Self-pay | Admitting: Internal Medicine

## 2014-12-10 ENCOUNTER — Other Ambulatory Visit: Payer: Self-pay | Admitting: Internal Medicine

## 2014-12-12 ENCOUNTER — Other Ambulatory Visit: Payer: Self-pay | Admitting: Internal Medicine

## 2014-12-12 DIAGNOSIS — D489 Neoplasm of uncertain behavior, unspecified: Secondary | ICD-10-CM | POA: Diagnosis not present

## 2014-12-12 DIAGNOSIS — L219 Seborrheic dermatitis, unspecified: Secondary | ICD-10-CM | POA: Diagnosis not present

## 2015-01-16 ENCOUNTER — Ambulatory Visit (INDEPENDENT_AMBULATORY_CARE_PROVIDER_SITE_OTHER): Payer: Medicare Other | Admitting: Internal Medicine

## 2015-01-16 ENCOUNTER — Encounter: Payer: Self-pay | Admitting: Internal Medicine

## 2015-01-16 ENCOUNTER — Other Ambulatory Visit (INDEPENDENT_AMBULATORY_CARE_PROVIDER_SITE_OTHER): Payer: Medicare Other

## 2015-01-16 VITALS — BP 168/84 | HR 79 | Ht 66.5 in | Wt 175.0 lb

## 2015-01-16 DIAGNOSIS — N400 Enlarged prostate without lower urinary tract symptoms: Secondary | ICD-10-CM | POA: Diagnosis not present

## 2015-01-16 DIAGNOSIS — E538 Deficiency of other specified B group vitamins: Secondary | ICD-10-CM

## 2015-01-16 DIAGNOSIS — E559 Vitamin D deficiency, unspecified: Secondary | ICD-10-CM

## 2015-01-16 DIAGNOSIS — I10 Essential (primary) hypertension: Secondary | ICD-10-CM

## 2015-01-16 DIAGNOSIS — M5440 Lumbago with sciatica, unspecified side: Secondary | ICD-10-CM | POA: Diagnosis not present

## 2015-01-16 DIAGNOSIS — Z0000 Encounter for general adult medical examination without abnormal findings: Secondary | ICD-10-CM

## 2015-01-16 DIAGNOSIS — E291 Testicular hypofunction: Secondary | ICD-10-CM

## 2015-01-16 LAB — URINALYSIS, ROUTINE W REFLEX MICROSCOPIC
Bilirubin Urine: NEGATIVE
Hgb urine dipstick: NEGATIVE
Ketones, ur: NEGATIVE
Nitrite: NEGATIVE
RBC / HPF: NONE SEEN (ref 0–?)
Specific Gravity, Urine: <=1.005 — AB (ref 1.000–1.030)
Total Protein, Urine: NEGATIVE
UROBILINOGEN UA: 0.2 (ref 0.0–1.0)
Urine Glucose: NEGATIVE
pH: 7 (ref 5.0–8.0)

## 2015-01-16 LAB — HEPATIC FUNCTION PANEL
ALK PHOS: 55 U/L (ref 39–117)
ALT: 13 U/L (ref 0–53)
AST: 16 U/L (ref 0–37)
Albumin: 4.5 g/dL (ref 3.5–5.2)
Bilirubin, Direct: 0.2 mg/dL (ref 0.0–0.3)
TOTAL PROTEIN: 7.1 g/dL (ref 6.0–8.3)
Total Bilirubin: 1.3 mg/dL — ABNORMAL HIGH (ref 0.2–1.2)

## 2015-01-16 LAB — BASIC METABOLIC PANEL
BUN: 16 mg/dL (ref 6–23)
CHLORIDE: 100 meq/L (ref 96–112)
CO2: 27 mEq/L (ref 19–32)
Calcium: 9.7 mg/dL (ref 8.4–10.5)
Creatinine, Ser: 1.12 mg/dL (ref 0.40–1.50)
GFR: 67.23 mL/min (ref >60.00)
Glucose, Bld: 124 mg/dL — ABNORMAL HIGH (ref 70–99)
POTASSIUM: 4.1 meq/L (ref 3.5–5.1)
Sodium: 138 mEq/L (ref 135–145)

## 2015-01-16 LAB — LIPID PANEL
Cholesterol: 250 mg/dL — ABNORMAL HIGH (ref 0–200)
HDL: 51.7 mg/dL (ref >39.00)
LDL Cholesterol: 164 mg/dL — ABNORMAL HIGH (ref 0–99)
NonHDL: 197.92
TRIGLYCERIDES: 169 mg/dL — AB (ref 0.0–149.0)
Total CHOL/HDL Ratio: 5
VLDL: 33.8 mg/dL (ref 0.0–40.0)

## 2015-01-16 LAB — CBC WITH DIFFERENTIAL/PLATELET
Basophils Absolute: 0 10*3/uL (ref 0.0–0.1)
Basophils Relative: 0.6 % (ref 0.0–3.0)
Eosinophils Absolute: 0.2 10*3/uL (ref 0.0–0.7)
Eosinophils Relative: 2.5 % (ref 0.0–5.0)
HCT: 45.8 % (ref 39.0–52.0)
Hemoglobin: 15.5 g/dL (ref 13.0–17.0)
LYMPHS ABS: 1.7 10*3/uL (ref 0.7–4.0)
Lymphocytes Relative: 20.3 % (ref 12.0–46.0)
MCHC: 33.8 g/dL (ref 30.0–36.0)
MCV: 91.3 fl (ref 78.0–100.0)
Monocytes Absolute: 0.5 10*3/uL (ref 0.1–1.0)
Monocytes Relative: 6.4 % (ref 3.0–12.0)
NEUTROS ABS: 5.8 10*3/uL (ref 1.4–7.7)
Neutrophils Relative %: 70.2 % (ref 43.0–77.0)
Platelets: 165 10*3/uL (ref 150.0–400.0)
RBC: 5.01 Mil/uL (ref 4.22–5.81)
RDW: 13.9 % (ref 11.5–15.5)
WBC: 8.3 10*3/uL (ref 4.0–10.5)

## 2015-01-16 LAB — VITAMIN D 25 HYDROXY (VIT D DEFICIENCY, FRACTURES): VITD: 18.93 ng/mL — AB (ref 30.00–100.00)

## 2015-01-16 LAB — PSA: PSA: 2.33 ng/mL (ref 0.10–4.00)

## 2015-01-16 LAB — VITAMIN B12: Vitamin B-12: 225 pg/mL (ref 211–911)

## 2015-01-16 LAB — TSH: TSH: 1.79 u[IU]/mL (ref 0.35–4.50)

## 2015-01-16 MED ORDER — ATENOLOL 50 MG PO TABS: 50.0000 mg | ORAL_TABLET | Freq: Two times a day (BID) | ORAL | Status: DC

## 2015-01-16 MED ORDER — DOXAZOSIN MESYLATE 4 MG PO TABS: 2.0000 mg | ORAL_TABLET | Freq: Every day | ORAL | Status: DC

## 2015-01-16 MED ORDER — VITAMIN D 1000 UNITS PO TABS: 1000.0000 [IU] | ORAL_TABLET | Freq: Every day | ORAL | Status: DC

## 2015-01-16 MED ORDER — LOSARTAN POTASSIUM 100 MG PO TABS: 100.0000 mg | ORAL_TABLET | Freq: Every day | ORAL | Status: DC

## 2015-01-16 NOTE — Patient Instructions (Signed)
Preventive Care for Adults A healthy lifestyle and preventive care can promote health and wellness. Preventive health guidelines for men include the following key practices:  A routine yearly physical is a good way to check with your health care provider about your health and preventative screening. It is a chance to share any concerns and updates on your health and to receive a thorough exam.  Visit your dentist for a routine exam and preventative care every 6 months. Brush your teeth twice a day and floss once a day. Good oral hygiene prevents tooth decay and gum disease.  The frequency of eye exams is based on your age, health, family medical history, use of contact lenses, and other factors. Follow your health care provider's recommendations for frequency of eye exams.  Eat a healthy diet. Foods such as vegetables, fruits, whole grains, low-fat dairy products, and lean protein foods contain the nutrients you need without too many calories. Decrease your intake of foods high in solid fats, added sugars, and salt. Eat the right amount of calories for you.Get information about a proper diet from your health care provider, if necessary.  Regular physical exercise is one of the most important things you can do for your health. Most adults should get at least 150 minutes of moderate-intensity exercise (any activity that increases your heart rate and causes you to sweat) each week. In addition, most adults need muscle-strengthening exercises on 2 or more days a week.  Maintain a healthy weight. The body mass index (BMI) is a screening tool to identify possible weight problems. It provides an estimate of body fat based on height and weight. Your health care provider can find your BMI and can help you achieve or maintain a healthy weight.For adults 20 years and older:  A BMI below 18.5 is considered underweight.  A BMI of 18.5 to 24.9 is normal.  A BMI of 25 to 29.9 is considered overweight.  A BMI  of 30 and above is considered obese.  Maintain normal blood lipids and cholesterol levels by exercising and minimizing your intake of saturated fat. Eat a balanced diet with plenty of fruit and vegetables. Blood tests for lipids and cholesterol should begin at age 50 and be repeated every 5 years. If your lipid or cholesterol levels are high, you are over 50, or you are at high risk for heart disease, you may need your cholesterol levels checked more frequently.Ongoing high lipid and cholesterol levels should be treated with medicines if diet and exercise are not working.  If you smoke, find out from your health care provider how to quit. If you do not use tobacco, do not start.  Lung cancer screening is recommended for adults aged 73-80 years who are at high risk for developing lung cancer because of a history of smoking. A yearly low-dose CT scan of the lungs is recommended for people who have at least a 30-pack-year history of smoking and are a current smoker or have quit within the past 15 years. A pack year of smoking is smoking an average of 1 pack of cigarettes a day for 1 year (for example: 1 pack a day for 30 years or 2 packs a day for 15 years). Yearly screening should continue until the smoker has stopped smoking for at least 15 years. Yearly screening should be stopped for people who develop a health problem that would prevent them from having lung cancer treatment.  If you choose to drink alcohol, do not have more than  2 drinks per day. One drink is considered to be 12 ounces (355 mL) of beer, 5 ounces (148 mL) of wine, or 1.5 ounces (44 mL) of liquor.  Avoid use of street drugs. Do not share needles with anyone. Ask for help if you need support or instructions about stopping the use of drugs.  High blood pressure causes heart disease and increases the risk of stroke. Your blood pressure should be checked at least every 1-2 years. Ongoing high blood pressure should be treated with  medicines, if weight loss and exercise are not effective.  If you are 45-79 years old, ask your health care provider if you should take aspirin to prevent heart disease.  Diabetes screening involves taking a blood sample to check your fasting blood sugar level. This should be done once every 3 years, after age 45, if you are within normal weight and without risk factors for diabetes. Testing should be considered at a younger age or be carried out more frequently if you are overweight and have at least 1 risk factor for diabetes.  Colorectal cancer can be detected and often prevented. Most routine colorectal cancer screening begins at the age of 50 and continues through age 75. However, your health care provider may recommend screening at an earlier age if you have risk factors for colon cancer. On a yearly basis, your health care provider may provide home test kits to check for hidden blood in the stool. Use of a small camera at the end of a tube to directly examine the colon (sigmoidoscopy or colonoscopy) can detect the earliest forms of colorectal cancer. Talk to your health care provider about this at age 50, when routine screening begins. Direct exam of the colon should be repeated every 5-10 years through age 75, unless early forms of precancerous polyps or small growths are found.  People who are at an increased risk for hepatitis B should be screened for this virus. You are considered at high risk for hepatitis B if:  You were born in a country where hepatitis B occurs often. Talk with your health care provider about which countries are considered high risk.  Your parents were born in a high-risk country and you have not received a shot to protect against hepatitis B (hepatitis B vaccine).  You have HIV or AIDS.  You use needles to inject street drugs.  You live with, or have sex with, someone who has hepatitis B.  You are a man who has sex with other men (MSM).  You get hemodialysis  treatment.  You take certain medicines for conditions such as cancer, organ transplantation, and autoimmune conditions.  Hepatitis C blood testing is recommended for all people born from 1945 through 1965 and any individual with known risks for hepatitis C.  Practice safe sex. Use condoms and avoid high-risk sexual practices to reduce the spread of sexually transmitted infections (STIs). STIs include gonorrhea, chlamydia, syphilis, trichomonas, herpes, HPV, and human immunodeficiency virus (HIV). Herpes, HIV, and HPV are viral illnesses that have no cure. They can result in disability, cancer, and death.  If you are at risk of being infected with HIV, it is recommended that you take a prescription medicine daily to prevent HIV infection. This is called preexposure prophylaxis (PrEP). You are considered at risk if:  You are a man who has sex with other men (MSM) and have other risk factors.  You are a heterosexual man, are sexually active, and are at increased risk for HIV infection.    You take drugs by injection.  You are sexually active with a partner who has HIV.  Talk with your health care provider about whether you are at high risk of being infected with HIV. If you choose to begin PrEP, you should first be tested for HIV. You should then be tested every 3 months for as long as you are taking PrEP.  A one-time screening for abdominal aortic aneurysm (AAA) and surgical repair of large AAAs by ultrasound are recommended for men ages 32 to 67 years who are current or former smokers.  Healthy men should no longer receive prostate-specific antigen (PSA) blood tests as part of routine cancer screening. Talk with your health care provider about prostate cancer screening.  Testicular cancer screening is not recommended for adult males who have no symptoms. Screening includes self-exam, a health care provider exam, and other screening tests. Consult with your health care provider about any symptoms  you have or any concerns you have about testicular cancer.  Use sunscreen. Apply sunscreen liberally and repeatedly throughout the day. You should seek shade when your shadow is shorter than you. Protect yourself by wearing long sleeves, pants, a wide-brimmed hat, and sunglasses year round, whenever you are outdoors.  Once a month, do a whole-body skin exam, using a mirror to look at the skin on your back. Tell your health care provider about new moles, moles that have irregular borders, moles that are larger than a pencil eraser, or moles that have changed in shape or color.  Stay current with required vaccines (immunizations).  Influenza vaccine. All adults should be immunized every year.  Tetanus, diphtheria, and acellular pertussis (Td, Tdap) vaccine. An adult who has not previously received Tdap or who does not know his vaccine status should receive 1 dose of Tdap. This initial dose should be followed by tetanus and diphtheria toxoids (Td) booster doses every 10 years. Adults with an unknown or incomplete history of completing a 3-dose immunization series with Td-containing vaccines should begin or complete a primary immunization series including a Tdap dose. Adults should receive a Td booster every 10 years.  Varicella vaccine. An adult without evidence of immunity to varicella should receive 2 doses or a second dose if he has previously received 1 dose.  Human papillomavirus (HPV) vaccine. Males aged 68-21 years who have not received the vaccine previously should receive the 3-dose series. Males aged 22-26 years may be immunized. Immunization is recommended through the age of 6 years for any male who has sex with males and did not get any or all doses earlier. Immunization is recommended for any person with an immunocompromised condition through the age of 49 years if he did not get any or all doses earlier. During the 3-dose series, the second dose should be obtained 4-8 weeks after the first  dose. The third dose should be obtained 24 weeks after the first dose and 16 weeks after the second dose.  Zoster vaccine. One dose is recommended for adults aged 50 years or older unless certain conditions are present.  Measles, mumps, and rubella (MMR) vaccine. Adults born before [birth year] generally are considered immune to measles and mumps. Adults born in [birth year] or later should have 1 or more doses of MMR vaccine unless there is a contraindication to the vaccine or there is laboratory evidence of immunity to each of the three diseases. A routine second dose of MMR vaccine should be obtained at least 28 days after the first dose for students attending postsecondary  schools, health care workers, or international travelers. People who received inactivated measles vaccine or an unknown type of measles vaccine during 1963-1967 should receive 2 doses of MMR vaccine. People who received inactivated mumps vaccine or an unknown type of mumps vaccine before 1979 and are at high risk for mumps infection should consider immunization with 2 doses of MMR vaccine. Unvaccinated health care workers born before [birth year] who lack laboratory evidence of measles, mumps, or rubella immunity or laboratory confirmation of disease should consider measles and mumps immunization with 2 doses of MMR vaccine or rubella immunization with 1 dose of MMR vaccine.  Pneumococcal 13-valent conjugate (PCV13) vaccine. When indicated, a person who is uncertain of his immunization history and has no record of immunization should receive the PCV13 vaccine. An adult aged 57 years or older who has certain medical conditions and has not been previously immunized should receive 1 dose of PCV13 vaccine. This PCV13 should be followed with a dose of pneumococcal polysaccharide (PPSV23) vaccine. The PPSV23 vaccine dose should be obtained at least 8 weeks after the dose of PCV13 vaccine. An adult aged 31 years or older who has certain medical conditions and  previously received 1 or more doses of PPSV23 vaccine should receive 1 dose of PCV13. The PCV13 vaccine dose should be obtained 1 or more years after the last PPSV23 vaccine dose.  Pneumococcal polysaccharide (PPSV23) vaccine. When PCV13 is also indicated, PCV13 should be obtained first. All adults aged 23 years and older should be immunized. An adult younger than age 70 years who has certain medical conditions should be immunized. Any person who resides in a nursing home or long-term care facility should be immunized. An adult smoker should be immunized. People with an immunocompromised condition and certain other conditions should receive both PCV13 and PPSV23 vaccines. People with human immunodeficiency virus (HIV) infection should be immunized as soon as possible after diagnosis. Immunization during chemotherapy or radiation therapy should be avoided. Routine use of PPSV23 vaccine is not recommended for American Indians, [place] Natives, or people younger than 65 years unless there are medical conditions that require PPSV23 vaccine. When indicated, people who have unknown immunization and have no record of immunization should receive PPSV23 vaccine. One-time revaccination 5 years after the first dose of PPSV23 is recommended for people aged 19-64 years who have chronic kidney failure, nephrotic syndrome, asplenia, or immunocompromised conditions. People who received 1-2 doses of PPSV23 before age 25 years should receive another dose of PPSV23 vaccine at age 67 years or later if at least 5 years have passed since the previous dose. Doses of PPSV23 are not needed for people immunized with PPSV23 at or after age 109 years.  Meningococcal vaccine. Adults with asplenia or persistent complement component deficiencies should receive 2 doses of quadrivalent meningococcal conjugate (MenACWY-D) vaccine. The doses should be obtained at least 2 months apart. Microbiologists working with certain meningococcal bacteria,  [employer] [place] recruits, people at risk during an outbreak, and people who travel to or live in countries with a high rate of meningitis should be immunized. A first-year college student up through age 14 years who is living in a residence hall should receive a dose if he did not receive a dose on or after his 16th birthday. Adults who have certain high-risk conditions should receive one or more doses of vaccine.  Hepatitis A vaccine. Adults who wish to be protected from this disease, have certain high-risk conditions, work with hepatitis A-infected animals, work in hepatitis A research labs, or  travel to or work in countries with a high rate of hepatitis A should be immunized. Adults who were previously unvaccinated and who anticipate close contact with an international adoptee during the first 60 days after arrival in the [place] from a country with a high rate of hepatitis A should be immunized.  Hepatitis B vaccine. Adults should be immunized if they wish to be protected from this disease, have certain high-risk conditions, may be exposed to blood or other infectious body fluids, are household contacts or sex partners of hepatitis B positive people, are clients or workers in certain care facilities, or travel to or work in countries with a high rate of hepatitis B.  Haemophilus influenzae type b (Hib) vaccine. A previously unvaccinated person with asplenia or sickle cell disease or having a scheduled splenectomy should receive 1 dose of Hib vaccine. Regardless of previous immunization, a recipient of a hematopoietic stem cell transplant should receive a 3-dose series 6-12 months after his successful transplant. Hib vaccine is not recommended for adults with HIV infection. Preventive Service / Frequency Ages 52 to 17  Blood pressure check.** / Every 1 to 2 years.  Lipid and cholesterol check.** / Every 5 years beginning at age 69.  Hepatitis C blood test.** / For any individual with known risks for  hepatitis C.  Skin self-exam. / Monthly.  Influenza vaccine. / Every year.  Tetanus, diphtheria, and acellular pertussis (Tdap, Td) vaccine.** / Consult your health care provider. 1 dose of Td every 10 years.  Varicella vaccine.** / Consult your health care provider.  HPV vaccine. / 3 doses over 6 months, if 72 or younger.  Measles, mumps, rubella (MMR) vaccine.** / You need at least 1 dose of MMR if you were born in 1957 or later. You may also need a second dose.  Pneumococcal 13-valent conjugate (PCV13) vaccine.** / Consult your health care provider.  Pneumococcal polysaccharide (PPSV23) vaccine.** / 1 to 2 doses if you smoke cigarettes or if you have certain conditions.  Meningococcal vaccine.** / 1 dose if you are age 35 to 60 years and a Market researcher living in a residence hall, or have one of several medical conditions. You may also need additional booster doses.  Hepatitis A vaccine.** / Consult your health care provider.  Hepatitis B vaccine.** / Consult your health care provider.  Haemophilus influenzae type b (Hib) vaccine.** / Consult your health care provider. Ages 35 to 8  Blood pressure check.** / Every 1 to 2 years.  Lipid and cholesterol check.** / Every 5 years beginning at age 57.  Lung cancer screening. / Every year if you are aged 44-80 years and have a 30-pack-year history of smoking and currently smoke or have quit within the past 15 years. Yearly screening is stopped once you have quit smoking for at least 15 years or develop a health problem that would prevent you from having lung cancer treatment.  Fecal occult blood test (FOBT) of stool. / Every year beginning at age 55 and continuing until age 73. You may not have to do this test if you get a colonoscopy every 10 years.  Flexible sigmoidoscopy** or colonoscopy.** / Every 5 years for a flexible sigmoidoscopy or every 10 years for a colonoscopy beginning at age 28 and continuing until age  1.  Hepatitis C blood test.** / For all people born from [birth year] through 1965 and any individual with known risks for hepatitis C.  Skin self-exam. / Monthly.  Influenza vaccine. / Every  year.  Tetanus, diphtheria, and acellular pertussis (Tdap/Td) vaccine.** / Consult your health care provider. 1 dose of Td every 10 years.  Varicella vaccine.** / Consult your health care provider.  Zoster vaccine.** / 1 dose for adults aged 53 years or older.  Measles, mumps, rubella (MMR) vaccine.** / You need at least 1 dose of MMR if you were born in 1955 or later. You may also need a second dose.  Pneumococcal 13-valent conjugate (PCV13) vaccine.** / Consult your health care provider.  Pneumococcal polysaccharide (PPSV23) vaccine.** / 1 to 2 doses if you smoke cigarettes or if you have certain conditions.  Meningococcal vaccine.** / Consult your health care provider.  Hepatitis A vaccine.** / Consult your health care provider.  Hepatitis B vaccine.** / Consult your health care provider.  Haemophilus influenzae type b (Hib) vaccine.** / Consult your health care provider. Ages 77 and over  Blood pressure check.** / Every 1 to 2 years.  Lipid and cholesterol check.**/ Every 5 years beginning at age 85.  Lung cancer screening. / Every year if you are aged 55-80 years and have a 30-pack-year history of smoking and currently smoke or have quit within the past 15 years. Yearly screening is stopped once you have quit smoking for at least 15 years or develop a health problem that would prevent you from having lung cancer treatment.  Fecal occult blood test (FOBT) of stool. / Every year beginning at age 33 and continuing until age 11. You may not have to do this test if you get a colonoscopy every 10 years.  Flexible sigmoidoscopy** or colonoscopy.** / Every 5 years for a flexible sigmoidoscopy or every 10 years for a colonoscopy beginning at age 28 and continuing until age 73.  Hepatitis C blood  test.** / For all people born from [birth year] through 1963 and any individual with known risks for hepatitis C.  Abdominal aortic aneurysm (AAA) screening.** / A one-time screening for ages 50 to 27 years who are current or former smokers.  Skin self-exam. / Monthly.  Influenza vaccine. / Every year.  Tetanus, diphtheria, and acellular pertussis (Tdap/Td) vaccine.** / 1 dose of Td every 10 years.  Varicella vaccine.** / Consult your health care provider.  Zoster vaccine.** / 1 dose for adults aged 34 years or older.  Pneumococcal 13-valent conjugate (PCV13) vaccine.** / Consult your health care provider.  Pneumococcal polysaccharide (PPSV23) vaccine.** / 1 dose for all adults aged 63 years and older.  Meningococcal vaccine.** / Consult your health care provider.  Hepatitis A vaccine.** / Consult your health care provider.  Hepatitis B vaccine.** / Consult your health care provider.  Haemophilus influenzae type b (Hib) vaccine.** / Consult your health care provider. **Family history and personal history of risk and conditions may change your health care provider's recommendations. Document Released: 07/20/2001 Document Revised: 05/29/2013 Document Reviewed: 10/19/2010 [facility] Patient Information 2015 [place], [place]. This information is not intended to replace advice given to you by your health care provider. Make sure you discuss any questions you have with your health care provider.

## 2015-01-16 NOTE — Progress Notes (Signed)
Pre visit review using our clinic review tool, if applicable. No additional management support is needed unless otherwise documented below in the visit note. 

## 2015-01-16 NOTE — Progress Notes (Signed)
Subjective:  Patient ID: Austin Torres, male    DOB: [birth year]  Age: 81 y.o. MRN: [id number]  CC: No chief complaint on file.   HPI MOUSTAPHA TOOKER presents for LBP, stress, HTN, B12 def f/u. He is here for a well exam.  Outpatient Prescriptions Prior to Visit  Medication Sig Dispense Refill  . BABY ASPIRIN PO Take 81 mg by mouth.     . Cyanocobalamin (VITAMIN B-12) 1000 MCG SUBL Place 1 tablet (1,000 mcg total) under the tongue daily. 100 tablet 3  . sildenafil (REVATIO) 20 MG tablet TAKE 3 TABLETS BY MOUTH DAILY AS NEEDED 30 tablet 0  . traMADol (ULTRAM) 50 MG tablet Take 1 tablet (50 mg total) by mouth every 6 (six) hours as needed. 30 tablet 0  . atenolol (TENORMIN) 50 MG tablet Take 1 tablet (50 mg total) by mouth daily. 90 tablet 3  . diphenoxylate-atropine (LOMOTIL) 2.5-0.025 MG per tablet Take 1 tablet by mouth 4 (four) times daily as needed for diarrhea or loose stools. 60 tablet 1  . loratadine (CLARITIN) 10 MG tablet Take 1 tablet (10 mg total) by mouth daily. 100 tablet 3  . losartan-hydrochlorothiazide (HYZAAR) 100-25 MG per tablet TAKE 1 TABLET BY MOUTH ONCE DAILY 30 tablet 0  . lovastatin (ALTOPREV) 60 MG 24 hr tablet Take 1 tablet (60 mg total) by mouth at bedtime. 30 tablet 11  . omeprazole (PRILOSEC) 20 MG capsule Take 2 capsules (40 mg total) by mouth daily. 60 capsule 5  . Testosterone (ANDROGEL) 25 MG/2.5GM GEL Place onto the skin. 2 pumps daily    . doxycycline (VIBRA-TABS) 100 MG tablet Take 1 tablet (100 mg total) by mouth 2 (two) times daily. (Patient not taking: Reported on 01/16/2015) 20 tablet 0   No facility-administered medications prior to visit.    ROS Review of Systems  Constitutional: Negative for appetite change, fatigue and unexpected weight change.  HENT: Negative for congestion, nosebleeds, sneezing, sore throat and trouble swallowing.   Eyes: Negative for itching and visual disturbance.  Respiratory: Negative for cough.   Cardiovascular:  Negative for chest pain, palpitations and leg swelling.  Gastrointestinal: Negative for nausea, diarrhea, blood in stool and abdominal distention.  Genitourinary: Negative for frequency and hematuria.  Musculoskeletal: Positive for back pain and arthralgias. Negative for joint swelling, gait problem and neck pain.  Skin: Negative for rash.  Neurological: Negative for dizziness, tremors, speech difficulty and weakness.  Psychiatric/Behavioral: Negative for suicidal ideas, behavioral problems, sleep disturbance, dysphoric mood and agitation. The patient is nervous/anxious.     Objective:  BP 168/84 mmHg  Pulse 79  Ht 5' 6.5" (1.689 m)  Wt 175 lb (79.379 kg)  BMI 27.83 kg/m2  SpO2 94%  BP Readings from Last 3 Encounters:  01/16/15 168/84  10/22/13 198/96  07/11/13 168/86    Wt Readings from Last 3 Encounters:  01/16/15 175 lb (79.379 kg)  10/22/13 175 lb (79.379 kg)  07/11/13 177 lb 9.6 oz (80.559 kg)    Physical Exam  Constitutional: He is oriented to person, place, and time. He appears well-developed. No distress.  NAD  HENT:  Mouth/Throat: Oropharynx is clear and moist.  Eyes: Conjunctivae are normal. Pupils are equal, round, and reactive to light.  Neck: Normal range of motion. No JVD present. No thyromegaly present.  Cardiovascular: Normal rate, regular rhythm, normal heart sounds and intact distal pulses.  Exam reveals no gallop and no friction rub.   No murmur heard. Pulmonary/Chest: Effort normal and breath  sounds normal. No respiratory distress. He has no wheezes. He has no rales. He exhibits no tenderness.  Abdominal: Soft. Bowel sounds are normal. He exhibits no distension and no mass. There is no tenderness. There is no rebound and no guarding.  Musculoskeletal: Normal range of motion. He exhibits no edema or tenderness.  Lymphadenopathy:    He has no cervical adenopathy.  Neurological: He is alert and oriented to person, place, and time. He has normal reflexes. No  cranial nerve deficit. He exhibits normal muscle tone. He displays a negative Romberg sign. Coordination and gait normal.  Skin: Skin is warm and dry. No rash noted.  Psychiatric: He has a normal mood and affect. His behavior is normal. Judgment and thought content normal.  midline hernia  Lab Results  Component Value Date   WBC 8.3 01/16/2015   HGB 15.5 01/16/2015   HCT 45.8 01/16/2015   PLT 165.0 01/16/2015   GLUCOSE 124* 01/16/2015   CHOL 250* 01/16/2015   TRIG 169.0* 01/16/2015   HDL 51.70 01/16/2015   LDLDIRECT 155.3 02/15/2012   LDLCALC 164* 01/16/2015   ALT 13 01/16/2015   AST 16 01/16/2015   NA 138 01/16/2015   K 4.1 01/16/2015   CL 100 01/16/2015   CREATININE 1.12 01/16/2015   BUN 16 01/16/2015   CO2 27 01/16/2015   TSH 1.79 01/16/2015   PSA 2.33 01/16/2015    Dg Chest 2 View  05/08/2013   CLINICAL DATA:  Hypertension.  Loss of consciousness.  EXAM: CHEST  2 VIEW  COMPARISON:  May 27, 2010  FINDINGS: The heart size and mediastinal contours are stable. The aorta is tortuous. Both lungs are clear. The visualized skeletal structures are stable.  IMPRESSION: No active cardiopulmonary disease.   Electronically Signed   By: Abelardo Diesel M.D.   On: 05/08/2013 21:32    Assessment & Plan:   Diagnoses and all orders for this visit:  Well adult exam -     Hepatic function panel; Future -     Basic metabolic panel; Future -     CBC with Differential/Platelet; Future -     Lipid panel; Future -     PSA; Future -     TSH; Future -     Urinalysis; Future -     Vitamin B12; Future -     Vit D  25 hydroxy (rtn osteoporosis monitoring); Future  B12 deficiency -     Hepatic function panel; Future -     Basic metabolic panel; Future -     CBC with Differential/Platelet; Future -     Lipid panel; Future -     PSA; Future -     TSH; Future -     Urinalysis; Future -     Vitamin B12; Future -     Vit D  25 hydroxy (rtn osteoporosis monitoring); Future  Essential  hypertension -     Hepatic function panel; Future -     Basic metabolic panel; Future -     CBC with Differential/Platelet; Future -     Lipid panel; Future -     PSA; Future -     TSH; Future -     Urinalysis; Future -     Vitamin B12; Future -     Vit D  25 hydroxy (rtn osteoporosis monitoring); Future  Hypogonadism male -     Hepatic function panel; Future -     Basic metabolic panel; Future -  CBC with Differential/Platelet; Future -     Lipid panel; Future -     PSA; Future -     TSH; Future -     Urinalysis; Future -     Vitamin B12; Future -     Vit D  25 hydroxy (rtn osteoporosis monitoring); Future  Midline low back pain with sciatica, sciatica laterality unspecified -     Hepatic function panel; Future -     Basic metabolic panel; Future -     CBC with Differential/Platelet; Future -     Lipid panel; Future -     PSA; Future -     TSH; Future -     Urinalysis; Future -     Vitamin B12; Future -     Vit D  25 hydroxy (rtn osteoporosis monitoring); Future  BPH (benign prostatic hyperplasia) -     Hepatic function panel; Future -     Basic metabolic panel; Future -     CBC with Differential/Platelet; Future -     Lipid panel; Future -     PSA; Future -     TSH; Future -     Urinalysis; Future -     Vitamin B12; Future -     Vit D  25 hydroxy (rtn osteoporosis monitoring); Future  Vitamin D deficiency -     Vit D  25 hydroxy (rtn osteoporosis monitoring); Future  Other orders -     Cancel: losartan-hydrochlorothiazide (HYZAAR) 100-25 MG per tablet; Take 1 tablet by mouth daily. -     atenolol (TENORMIN) 50 MG tablet; Take 1 tablet (50 mg total) by mouth 2 (two) times daily. -     losartan (COZAAR) 100 MG tablet; Take 1 tablet (100 mg total) by mouth daily. -     cholecalciferol (VITAMIN D) 1000 UNITS tablet; Take 1 tablet (1,000 Units total) by mouth daily. -     doxazosin (CARDURA) 4 MG tablet; Take 0.5 tablets (2 mg total) by mouth daily. Take at hs -      ergocalciferol (VITAMIN D2) 50000 UNITS capsule; Take 1 capsule (50,000 Units total) by mouth once a week.   I have discontinued Mr. Mott lovastatin, Testosterone, loratadine, doxycycline, omeprazole, diphenoxylate-atropine, and losartan-hydrochlorothiazide. I have also changed his atenolol and doxazosin. Additionally, I am having him start on losartan, cholecalciferol, and ergocalciferol. Lastly, I am having him maintain his BABY ASPIRIN PO, traMADol, Vitamin B-12, and sildenafil.  Meds ordered this encounter  Medications  . DISCONTD: doxazosin (CARDURA) 4 MG tablet    Sig: Take 0.5 tablets by mouth daily.  Marland Kitchen atenolol (TENORMIN) 50 MG tablet    Sig: Take 1 tablet (50 mg total) by mouth 2 (two) times daily.    Dispense:  180 tablet    Refill:  3  . losartan (COZAAR) 100 MG tablet    Sig: Take 1 tablet (100 mg total) by mouth daily.    Dispense:  90 tablet    Refill:  3  . cholecalciferol (VITAMIN D) 1000 UNITS tablet    Sig: Take 1 tablet (1,000 Units total) by mouth daily.    Dispense:  100 tablet    Refill:  3  . doxazosin (CARDURA) 4 MG tablet    Sig: Take 0.5 tablets (2 mg total) by mouth daily. Take at hs    Dispense:  90 tablet    Refill:  3  . ergocalciferol (VITAMIN D2) 50000 UNITS capsule    Sig: Take 1 capsule (50,000  Units total) by mouth once a week.    Dispense:  6 capsule    Refill:  0     Follow-up: No Follow-up on file.  Walker Kehr, MD

## 2015-01-16 NOTE — Assessment & Plan Note (Signed)
Not on Rx 

## 2015-01-16 NOTE — Assessment & Plan Note (Addendum)
D/c HCTZ Cardura

## 2015-01-16 NOTE — Assessment & Plan Note (Signed)
Colonoscopy - October '04 - was due in '14 - discussed   Here for medicare wellness/physical  Diet: heart healthy  Physical activity: not sedentary  Depression/mood screen: negative  Hearing: intact to whispered voice  Visual acuity: grossly normal, performs annual eye exam  ADLs: capable  Fall risk: none  Home safety: good  Cognitive evaluation: intact to orientation, naming, recall and repetition  EOL planning: adv directives, full code/ I agree  I have personally reviewed and have noted  1. The patient's medical, surgical and social history  2. Their use of alcohol, tobacco or illicit drugs  3. Their current medications and supplements  4. The patient's functional ability including ADL's, fall risks, home safety risks and hearing or visual impairment.  5. Diet and physical activities  6. Evidence for depression or mood disorders 7. The roster of all physicians providing medical care to patient - is listed in the Snapshot section of the chart and reviewed today.    Today patient counseled on age appropriate routine health concerns for screening and prevention, each reviewed and up to date or declined. Immunizations reviewed and up to date or declined. Labs ordered and reviewed. Risk factors for depression reviewed and negative. Hearing function and visual acuity are intact. ADLs screened and addressed as needed. Functional ability and level of safety reviewed and appropriate. Education, counseling and referrals performed based on assessed risks today. Patient provided with a copy of personalized plan for preventive services.

## 2015-01-16 NOTE — Assessment & Plan Note (Signed)
Risks associated with treatment noncompliance were discussed. Compliance was encouraged. D/c diuretic Cont Losartan and increase Atenolol

## 2015-01-16 NOTE — Assessment & Plan Note (Signed)
  Dr Nelva Bush - on Tramadol

## 2015-01-16 NOTE — Assessment & Plan Note (Addendum)
?  compliance Re-start B12 Labs

## 2015-01-18 DIAGNOSIS — E559 Vitamin D deficiency, unspecified: Secondary | ICD-10-CM | POA: Insufficient documentation

## 2015-01-18 MED ORDER — ERGOCALCIFEROL 1.25 MG (50000 UT) PO CAPS: 50000.0000 [IU] | ORAL_CAPSULE | ORAL | Status: DC

## 2015-01-18 NOTE — Assessment & Plan Note (Signed)
Chronic Risks associated with treatment noncompliance were discussed. Compliance was encouraged. Start Vit D prescription 50000 iu weekly (Rx emailed to your pharmacy) followed by over-the-counter Vit D 1000 iu daily.

## 2015-02-15 DIAGNOSIS — S335XXS Sprain of ligaments of lumbar spine, sequela: Secondary | ICD-10-CM | POA: Diagnosis not present

## 2015-02-15 DIAGNOSIS — S134XXS Sprain of ligaments of cervical spine, sequela: Secondary | ICD-10-CM | POA: Diagnosis not present

## 2015-02-26 ENCOUNTER — Ambulatory Visit: Payer: Medicare Other

## 2015-02-26 DIAGNOSIS — Z23 Encounter for immunization: Secondary | ICD-10-CM

## 2015-03-18 DIAGNOSIS — M7061 Trochanteric bursitis, right hip: Secondary | ICD-10-CM | POA: Diagnosis not present

## 2015-03-18 DIAGNOSIS — M545 Low back pain: Secondary | ICD-10-CM | POA: Diagnosis not present

## 2015-03-18 DIAGNOSIS — R2242 Localized swelling, mass and lump, left lower limb: Secondary | ICD-10-CM | POA: Diagnosis not present

## 2015-03-18 DIAGNOSIS — Z01812 Encounter for preprocedural laboratory examination: Secondary | ICD-10-CM | POA: Diagnosis not present

## 2015-04-02 ENCOUNTER — Telehealth: Payer: Self-pay | Admitting: Internal Medicine

## 2015-04-02 MED ORDER — NAPROXEN 500 MG PO TABS
500.0000 mg | ORAL_TABLET | Freq: Every day | ORAL | Status: DC | PRN
Start: 2015-04-02 — End: 2016-06-29

## 2015-04-02 NOTE — Telephone Encounter (Signed)
I have scheduled patient to come in this Friday to be seen. Patient would like to review x rays and also is requesting an antiinflammatory to be prescribed.  States [facility] will not prescribe this.  He is requesting either to be worked in today to be seen or an antiinflammatory to be sent to [facility] today.  Gave x rays to [place].

## 2015-04-02 NOTE — Telephone Encounter (Signed)
Called pt no answer LMOM md sent med to pharmacy...Austin Torres

## 2015-04-02 NOTE — Telephone Encounter (Signed)
Ok Naproxen Rx Thx

## 2015-04-04 ENCOUNTER — Encounter: Payer: Self-pay | Admitting: Internal Medicine

## 2015-04-04 ENCOUNTER — Ambulatory Visit (INDEPENDENT_AMBULATORY_CARE_PROVIDER_SITE_OTHER): Payer: Medicare Other | Admitting: Internal Medicine

## 2015-04-04 VITALS — BP 170/78 | HR 55 | Wt 181.0 lb

## 2015-04-04 DIAGNOSIS — S76112A Strain of left quadriceps muscle, fascia and tendon, initial encounter: Secondary | ICD-10-CM | POA: Diagnosis not present

## 2015-04-04 DIAGNOSIS — S76119A Strain of unspecified quadriceps muscle, fascia and tendon, initial encounter: Secondary | ICD-10-CM | POA: Insufficient documentation

## 2015-04-04 DIAGNOSIS — I10 Essential (primary) hypertension: Secondary | ICD-10-CM | POA: Diagnosis not present

## 2015-04-04 DIAGNOSIS — E538 Deficiency of other specified B group vitamins: Secondary | ICD-10-CM | POA: Diagnosis not present

## 2015-04-04 DIAGNOSIS — S76112D Strain of left quadriceps muscle, fascia and tendon, subsequent encounter: Secondary | ICD-10-CM

## 2015-04-04 NOTE — Assessment & Plan Note (Signed)
On Atenolol 

## 2015-04-04 NOTE — Assessment & Plan Note (Addendum)
10/16 Left (vs other dx) Sports med ref for a consultation w/Dr Tamala Julian

## 2015-04-04 NOTE — Assessment & Plan Note (Signed)
On B12 

## 2015-04-04 NOTE — Patient Instructions (Signed)
Quadriceps Tendon Tear or Disruption With Rehab The quadriceps muscles are located on the front of the thigh and are responsible for straightening the knee and bending the hip. The quadriceps tendon connects these muscles to the kneecap (patella) and also from the patella to a portion of the shin bone (tibial tubercle). A quadriceps tendon tear or disruption is characterized by a partial or complete tear of the quadriceps tendon between the quadriceps muscles and the patella. Quadriceps tendon tears or disruptions often cause pain above the knee and result in a decrease in function of the quadriceps muscles.  SYMPTOMS   A "pop" or tear felt or heard above the patella at the time of injury.  Pain, tenderness, inflammation, and/or bruising over the quadriceps tendon.  Pain that worsens with use of the quadriceps muscles.  Difficulty with common tasks that involve the quadriceps muscle, such as walking.  A crackling sound (crepitation) when the tendon is moved or touched.  Loss of fullness of the muscle or bulging within the area of muscle with complete rupture. CAUSES  A strain occurs when a force is placed on the muscle or tendon that is greater than it can withstand. Common mechanisms of injury include:  Repetitive strenuous use of the quadriceps muscles. This may be due to an increase in the intensity, frequency, or duration of exercise.  Direct trauma to the quadriceps muscles or tendons. RISK INCREASES WITH:  Activities that involve forceful contractions of the quadriceps muscles (jumping or sprinting).  Contact sports (soccer or football).  Poor strength and flexibility.  Failure to warm-up properly before activity.  Previous injury to the thigh or knee.  Untreated quadriceps tendinitis.  Corticosteroid injections into the quadriceps tendon. PREVENTION  Warm up and stretch properly before activity.  Allow for adequate recovery between workouts.  Maintain physical  fitness:  Strength, flexibility, and endurance.  Cardiovascular fitness.  Wear properly fitted and padded protective equipment. PROGNOSIS  If treated properly, then recovery from a quadriceps tendon tear or disruption usually occurs; however the recovery period may b 6 to 9 months.  RELATED COMPLICATIONS   Quadriceps muscle weakness.  Re-rupture of the tendon after treatment.  Prolonged healing time, if improperly treated or re-injured.  Risks of surgery: infection, bleeding, nerve damage, or damage to surrounding tissues. TREATMENT  Initial treatment involves rest from any activities that aggravate the symptoms. Ice, medication, and elevation may be used to help reduce pain and inflammation. The use of strengthening and stretching exercises may help reduce pain with activity. These exercises may be performed at home or with referral to a therapist. If the tear is complete, then surgery is usually required to repair the tendon, as it cannot heal on its own. After surgery immobilization is required to allow for healing. After immobilization it is important to perform strengthening and stretching exercises to help regain strength and a full range of motion.  MEDICATION   If pain medication is necessary, then nonsteroidal anti-inflammatory medications, such as aspirin and ibuprofen, or other minor pain relievers, such as acetaminophen, are often recommended.  Do not take pain medication for 7 days before surgery.  Prescription pain relievers may be given if deemed necessary by your caregiver. Use only as directed and only as much as you need. COLD THERAPY  Cold treatment (icing) relieves pain and reduces inflammation. Cold treatment should be applied for 10 to 15 minutes every 2 to 3 hours for inflammation and pain and immediately after any activity that aggravates your symptoms. Use  ice packs or massage the area with a piece of ice (ice massage). SEEK MEDICAL CARE IF:  Treatment seems to  offer no benefit, or the condition worsens.  Any medications produce adverse side effects.  Any complications from surgery occur:  Pain, numbness, or coldness in the extremity operated upon.  Discoloration of the nail beds (they become blue or gray) of the extremity operated upon.  Signs of infections (fever, pain, inflammation, redness, or persistent bleeding). EXERCISES RANGE OF MOTION (ROM) AND STRETCHING EXERCISES - Quadriceps Tendon Tear/Disruption These exercises may help you when beginning to rehabilitate your injury. Your symptoms may resolve with or without further involvement from your physician, physical therapist or athletic trainer. While completing these exercises, remember:   Restoring tissue flexibility helps normal motion to return to the joints. This allows healthier, less painful movement and activity.  An effective stretch should be held for at least 30 seconds.  A stretch should never be painful. You should only feel a gentle lengthening or release in the stretched tissue. RANGE OF MOTION - Knee Flexion and Extension, Active-Assisted  Sit on the edge of a table or chair with your thighs firmly supported. It may be helpful to place a folded towel under the end of your right / left thigh.  Flexion (bending): Place the ankle of your healthy leg on top of the other ankle. Use your healthy leg to gently bend your right / left knee until you feel a mild tension across the top of your knee.  Hold for __________ seconds.  Extension (straightening): Switch your ankles so your right / left leg is on top. Use your healthy leg to straighten your right / left knee until you feel a mild tension on the backside of your knee.  Hold for __________ seconds. Repeat __________ times. Complete __________ times per day. RANGE OF MOTION - Knee Flexion, Active  Lie on your back with both knees straight. (If this causes back discomfort, bend your opposite knee, placing your foot flat on  the floor.)  Slowly slide your heel back toward your buttocks until you feel a gentle stretch in the front of your knee or thigh.  Hold for __________ seconds. Slowly slide your heel back to the starting position. Repeat __________ times. Complete this exercise __________ times per day.  STRETCH - Knee Flexion, Supine  Lie on the floor with your right / left heel/foot lightly touching the wall (place both feet on the wall if you do not use a door frame).  Without using any effort, allow gravity to slide your foot down the wall slowly until you feel a gentle stretch in the front of your right / left knee.  Hold this stretch for __________ seconds. Then return the leg to the starting position, using your health leg for help, if needed. Repeat __________ times. Complete this stretch __________ times per day.  STRETCH - Quadriceps, Prone   Lie on your stomach on a firm surface, such as a bed or padded floor.  Bend your right / left knee and grasp your ankle. If you are unable to reach, your ankle or pant leg, use a belt around your foot to lengthen your reach.  Gently pull your heel toward your buttocks. Your knee should not slide out to the side. You should feel a stretch in the front of your thigh and/or knee. Hold this position for __________ seconds.  Repeat __________ times. Complete this stretch __________ times per day.  STRENGTHENING EXERCISES - Quadriceps Tendon  Tear/Disruption These exercises may help you when beginning to rehabilitate your injury. They may resolve your symptoms with or without further involvement from your physician, physical therapist or athletic trainer. While completing these exercises, remember:   Muscles can gain both the endurance and the strength needed for everyday activities through controlled exercises.  Complete these exercises as instructed by your physician, physical therapist or athletic trainer. Progress the resistance and repetitions only as  guided. STRENGTH - Quadriceps, Isometrics  Lie on your back with your right / left leg extended and your opposite knee bent.  Gradually tense the muscles in the front of your right / left thigh. You should see either your knee cap slide up toward your hip or increased dimpling just above the knee. This motion will push the back of the knee down toward the floor/mat/bed on which you are lying.  Hold the muscle as tight as you can without increasing your pain for __________ seconds.  Relax the muscles slowly and completely in between each repetition. Repeat __________ times. Complete this exercise __________ times per day.  STRENGTH - Quadriceps, Short Arcs   Lie on your back. Place a __________ inch towel roll under your knee so that the knee slightly bends.  Raise only your lower leg by tightening the muscles in the front of your thigh. Do not allow your thigh to rise.  Hold this position for __________ seconds. Repeat __________ times. Complete this exercise __________ times per day.  OPTIONAL ANKLE WEIGHTS: Begin with ____________________, but DO NOT exceed ____________________. Increase in1 lb/0.5 kg increments.  STRENGTH - Quadriceps, Straight Leg Raises  Quality counts! Watch for signs that the quadriceps muscle is working to insure you are strengthening the correct muscles and not "cheating" by substituting with healthier muscles.  Lay on your back with your right / left leg extended and your opposite knee bent.  Tense the muscles in the front of your right / left thigh. You should see either your knee cap slide up or increased dimpling just above the knee. Your thigh may even quiver.  Tighten these muscles even more and raise your leg 4 to 6 inches off the floor. Hold for __________ seconds.  Keeping these muscles tense, lower your leg.  Relax the muscles slowly and completely in between each repetition. Repeat __________ times. Complete this exercise __________ times per day.   STRENGTH - Quadriceps, Step-Ups   Use a thick book, step or step stool that is __________ inches tall.  Holding a wall or counter for balance only, not support.  Slowly step-up with your right / left foot, keeping your knee in line with your hip and foot. Do not allow your knee to bend so far that you cannot see your toes.  Slowly unlock your knee and lower yourself to the starting position. Your muscles, not gravity, should lower you. Repeat __________ times. Complete this exercise __________ times per day.  STRENGTH - Quadriceps, Wall Slides  Follow guidelines for form closely. Increased knee pain often results from poorly placed feet or knees.  Lean against a smooth wall or door and walk your feet out 18-24 inches. Place your feet hip-width apart.  Slowly slide down the wall or door until your knees bend __________ degrees.* Keep your knees over your heels, not your toes, and in line with your hips, not falling to either side.  Hold for __________ seconds. Stand up to rest for __________ seconds in between each repetition. Repeat __________ times. Complete this exercise __________  times per day. * Your physician, physical therapist or athletic trainer will alter this angle based on your symptoms and progress.   This information is not intended to replace advice given to you by your health care provider. Make sure you discuss any questions you have with your health care provider.   Document Released: 05/24/2005 Document Revised: 10/08/2014 Document Reviewed: 09/05/2008 Elsevier Interactive Patient Education [employer].

## 2015-04-04 NOTE — Progress Notes (Signed)
Subjective:  Patient ID: Austin Torres, male    DOB: 02/03/1934  Age: 81 y.o. MRN: [id number]  CC: No chief complaint on file.   HPI [name] presents for L thigh mass. Pt noticed it first 2 mo ago. It "popped" one day when he was helping his wife w/something. No pain now. He was checked by [facility] (Dr Delfino Lovett - [facility]). They offered an MRI  - too $$$  Outpatient Prescriptions Prior to Visit  Medication Sig Dispense Refill  . atenolol (TENORMIN) 50 MG tablet Take 1 tablet (50 mg total) by mouth 2 (two) times daily. 180 tablet 3  . BABY ASPIRIN PO Take 81 mg by mouth.     . losartan (COZAAR) 100 MG tablet Take 1 tablet (100 mg total) by mouth daily. 90 tablet 3  . naproxen (NAPROSYN) 500 MG tablet Take 1 tablet (500 mg total) by mouth daily as needed for moderate pain. 30 tablet 3  . sildenafil (REVATIO) 20 MG tablet TAKE 3 TABLETS BY MOUTH DAILY AS NEEDED 30 tablet 0  . traMADol (ULTRAM) 50 MG tablet Take 1 tablet (50 mg total) by mouth every 6 (six) hours as needed. 30 tablet 0  . cholecalciferol (VITAMIN D) 1000 UNITS tablet Take 1 tablet (1,000 Units total) by mouth daily. (Patient not taking: Reported on 04/04/2015) 100 tablet 3  . Cyanocobalamin (VITAMIN B-12) 1000 MCG SUBL Place 1 tablet (1,000 mcg total) under the tongue daily. (Patient not taking: Reported on 04/04/2015) 100 tablet 3  . doxazosin (CARDURA) 4 MG tablet Take 0.5 tablets (2 mg total) by mouth daily. Take at hs (Patient not taking: Reported on 04/04/2015) 90 tablet 3  . ergocalciferol (VITAMIN D2) 50000 UNITS capsule Take 1 capsule (50,000 Units total) by mouth once a week. (Patient not taking: Reported on 04/04/2015) 6 capsule 0   No facility-administered medications prior to visit.    ROS Review of Systems  Constitutional: Negative for appetite change, fatigue and unexpected weight change.  HENT: Negative for congestion, nosebleeds, sneezing, sore throat and trouble swallowing.   Eyes: Negative  for itching and visual disturbance.  Respiratory: Negative for cough.   Cardiovascular: Negative for chest pain, palpitations and leg swelling.  Gastrointestinal: Negative for nausea, diarrhea, blood in stool and abdominal distention.  Genitourinary: Negative for frequency and hematuria.  Musculoskeletal: Negative for back pain, joint swelling, gait problem and neck pain.  Skin: Negative for rash and wound.  Neurological: Negative for dizziness, tremors, speech difficulty and weakness.  Psychiatric/Behavioral: Negative for sleep disturbance, dysphoric mood and agitation. The patient is not nervous/anxious.    L anter thigh mass  Objective:  BP 170/78 mmHg  Pulse 55  Wt 181 lb (82.101 kg)  SpO2 97%  BP Readings from Last 3 Encounters:  04/04/15 170/78  01/16/15 168/84  10/22/13 198/96    Wt Readings from Last 3 Encounters:  04/04/15 181 lb (82.101 kg)  01/16/15 175 lb (79.379 kg)  10/22/13 175 lb (79.379 kg)    Physical Exam  Constitutional: He is oriented to person, place, and time. He appears well-developed. No distress.  NAD  HENT:  Mouth/Throat: Oropharynx is clear and moist.  Eyes: Conjunctivae are normal. Pupils are equal, round, and reactive to light.  Neck: Normal range of motion. No JVD present. No thyromegaly present.  Cardiovascular: Normal rate, regular rhythm, normal heart sounds and intact distal pulses.  Exam reveals no gallop and no friction rub.   No murmur heard. Pulmonary/Chest: Effort normal  and breath sounds normal. No respiratory distress. He has no wheezes. He has no rales. He exhibits no tenderness.  Abdominal: Soft. Bowel sounds are normal. He exhibits no distension and no mass. There is no tenderness. There is no rebound and no guarding.  Musculoskeletal: Normal range of motion. He exhibits no edema or tenderness.  Lymphadenopathy:    He has no cervical adenopathy.  Neurological: He is alert and oriented to person, place, and time. He has normal  reflexes. No cranial nerve deficit. He exhibits normal muscle tone. He displays a negative Romberg sign. Coordination and gait normal.  Skin: Skin is warm and dry. No rash noted.  Psychiatric: He has a normal mood and affect. His behavior is normal. Judgment and thought content normal.  L anterior dist thigh NT oval orange size, elastic  Lab Results  Component Value Date   WBC 8.3 01/16/2015   HGB 15.5 01/16/2015   HCT 45.8 01/16/2015   PLT 165.0 01/16/2015   GLUCOSE 124* 01/16/2015   CHOL 250* 01/16/2015   TRIG 169.0* 01/16/2015   HDL 51.70 01/16/2015   LDLDIRECT 155.3 02/15/2012   LDLCALC 164* 01/16/2015   ALT 13 01/16/2015   AST 16 01/16/2015   NA 138 01/16/2015   K 4.1 01/16/2015   CL 100 01/16/2015   CREATININE 1.12 01/16/2015   BUN 16 01/16/2015   CO2 27 01/16/2015   TSH 1.79 01/16/2015   PSA 2.33 01/16/2015    Dg Chest 2 View  05/08/2013  CLINICAL DATA:  Hypertension.  Loss of consciousness. EXAM: CHEST  2 VIEW COMPARISON:  May 27, 2010 FINDINGS: The heart size and mediastinal contours are stable. The aorta is tortuous. Both lungs are clear. The visualized skeletal structures are stable. IMPRESSION: No active cardiopulmonary disease. Electronically Signed   By: Abelardo Diesel M.D.   On: 05/08/2013 21:32    Assessment & Plan:   Diagnoses and all orders for this visit:  Quadriceps tendon rupture, left, subsequent encounter -     Ambulatory referral to Sports Medicine  I am having Mr. Detienne maintain his BABY ASPIRIN PO, traMADol, Vitamin B-12, sildenafil, atenolol, losartan, cholecalciferol, doxazosin, ergocalciferol, and naproxen.  No orders of the defined types were placed in this encounter.     Follow-up: No Follow-up on file.  Walker Kehr, MD

## 2015-04-04 NOTE — Progress Notes (Signed)
Pre visit review using our clinic review tool, if applicable. No additional management support is needed unless otherwise documented below in the visit note. 

## 2015-04-15 ENCOUNTER — Ambulatory Visit (INDEPENDENT_AMBULATORY_CARE_PROVIDER_SITE_OTHER): Payer: Medicare Other | Admitting: Family Medicine

## 2015-04-15 ENCOUNTER — Other Ambulatory Visit (INDEPENDENT_AMBULATORY_CARE_PROVIDER_SITE_OTHER): Payer: Medicare Other

## 2015-04-15 ENCOUNTER — Encounter: Payer: Self-pay | Admitting: Family Medicine

## 2015-04-15 VITALS — BP 150/72 | HR 65 | Ht 66.5 in | Wt 178.0 lb

## 2015-04-15 DIAGNOSIS — M79605 Pain in left leg: Secondary | ICD-10-CM

## 2015-04-15 DIAGNOSIS — S76112A Strain of left quadriceps muscle, fascia and tendon, initial encounter: Secondary | ICD-10-CM

## 2015-04-15 DIAGNOSIS — S76119A Strain of unspecified quadriceps muscle, fascia and tendon, initial encounter: Secondary | ICD-10-CM | POA: Insufficient documentation

## 2015-04-15 NOTE — Progress Notes (Signed)
[facility] [address] [place], [place] [address] Phone: [phone] Subjective:    I'm seeing this patient by the request  of:  Walker Kehr, MD   CC: Left quadricep injury  [id number] Austin Torres is a 81 y.o. male coming in with complaint of patient states multiple months ago he did have an audible pop and had some mild pain. Seems to be one size initially and has stated that size. Patient has seen another provider for this left thigh mass. States that throughout all time and was not giving him any injury or any pain. Patient does not have any knee pain. Denies any radiation down the leg. Denies any fever, chills, or any abnormal weight loss. Patient did see a provider at an orthopedic office and they would like to get an MRI. Due to financial constraints patient does not want to have this done. Patient able to do daily activities. Denies any increasing in size.     Past Medical History  Diagnosis Date  . Hyperlipidemia   . Hypertension   . Hypogonadism male   . BPH (benign prostatic hypertrophy)   . B12 deficiency   . Skin cancer   . GERD (gastroesophageal reflux disease)    Past Surgical History  Procedure Laterality Date  . Appendectomy    . Tonsillectomy    . Eye surgery      glaucoma procedure -both eyes  . Skin cancer excision      scalp   Social History  Substance Use Topics  . Smoking status: Current Every Day Smoker -- 1.00 packs/day    Types: Cigars  . Smokeless tobacco: Never Used  . Alcohol Use: 7.5 oz/week    15 drink(s) per week   No Known Allergies Family History  Problem Relation Age of Onset  . Heart disease Father   . Heart disease Brother     CAD/MI  . Cancer Neg Hx     no colon cancer, no prostate cancer  . Colon cancer Neg Hx   . Esophageal cancer Neg Hx   . Rectal cancer Neg Hx   . Stomach cancer Neg Hx      Past medical history, social, surgical and family history all reviewed in electronic  medical record.   Review of Systems: No headache, visual changes, nausea, vomiting, diarrhea, constipation, dizziness, abdominal pain, skin rash, fevers, chills, night sweats, weight loss, swollen lymph nodes, body aches, joint swelling, muscle aches, chest pain, shortness of breath, mood changes.   Objective Blood pressure 150/72, pulse 65, height 5' 6.5" (1.689 m), weight 178 lb (80.74 kg), SpO2 95 %.  General: No apparent distress alert and oriented x3 mood and affect normal, dressed appropriately.  HEENT: Pupils equal, extraocular movements intact  Respiratory: Patient's speak in full sentences and does not appear short of breath  Cardiovascular: No lower extremity edema, non tender, no erythema  Skin: Warm dry intact with no signs of infection or rash on extremities or on axial skeleton.  Abdomen: Soft nontender  Neuro: Cranial nerves II through XII are intact, neurovascularly intact in all extremities with 2+ DTRs and 2+ pulses.  Lymph: No lymphadenopathy of posterior or anterior cervical chain or axillae bilaterally.  Gait normal with good balance and coordination.  MSK:  Non tender with full range of motion and good stability and symmetric strength and tone of shoulders, elbows, wrist, hip, and ankles bilaterally.  Knee: Left Inspection reveals a patient has around like structure noted  approximately 8 cm from his knee on the anterior thigh. Nontender with some good moving. ROM full in flexion and extension and lower leg rotation. Ligaments with solid consistent endpoints including ACL, PCL, LCL, MCL. Negative Mcmurray's, Apley's, and Thessalonian tests. Non painful patellar compression. Patellar glide without crepitus. Patellar and quadriceps tendons unremarkable. Hamstring and quadriceps strength is normal. Extensor mechanism intact Contralateral leg does have a skin graft but no signs of infection.   MSK US performed of: Left quadriceps This study was ordered, performed, and  interpreted by Charlann Boxer D.O.  Knee: All structures visualized. Anteromedial, anterolateral, posteromedial, and posterolateral menisci unremarkable without tearing, fraying, effusion, or displacement. Patellar Tendon unremarkable on long and transverse views without effusion. Quadricep muscle shows what seems to be a possible tear of the rectus femoris. Patient does have an underlying seroma of the muscle. No significant increase in Doppler flow or any vascular sensation of this area. No abnormality of prepatellar bursa. LCL and MCL unremarkable on long and transverse views. No abnormality of origin of medial or lateral head of the gastrocnemius.  IMPRESSION:  Likely muscle injury with underlying seroma, difficult to assess if any masses there. No increasing vascularization. No bony involvement or erosion noted.    Impression and Recommendations:     This case required medical decision making of moderate complexity.

## 2015-04-15 NOTE — Patient Instructions (Addendum)
I think it is a muscle injury with underlying seroma.  Ice 20 minutes 2 times daily. Usually after activity and before bed. Tommy copper or thigh compression sleeve from [employer] or [facility].  Wear it daily and not at night Call me if it gets bigger Try duexis 3 times a day for next 6 days. Stop if it hurts your stomach See me again in 2 weeks and if not better we will try to drain the fluid under it.

## 2015-04-15 NOTE — Assessment & Plan Note (Signed)
I do believe the patient does have an underlying injury to the muscle. Patient has no pain to the area. There is no significant increase in vascularization.  Patient has not had any weight loss, no fevers or chills or no abnormal findings. Patient is a provider previously. They would like to have an MRI. We discussed that this could be a possible further evaluation. Even though the likelihood cancer is very low I think that there is potential for that. Patient will try compression, icing, oral anti-inflammatories and see if this makes a benefit. If it does not seem to go down I would like to try to aspirate the seroma and we may want to send the fluid for further evaluation. Patient will come back and see me again in 2-3 weeks. With patient having no weakness or pain I do not think any surgical intervention will be necessary.

## 2015-04-15 NOTE — Progress Notes (Signed)
Pre visit review using our clinic review tool, if applicable. No additional management support is needed unless otherwise documented below in the visit note. 

## 2015-04-23 ENCOUNTER — Ambulatory Visit: Payer: Medicare Other | Admitting: Internal Medicine

## 2015-04-28 ENCOUNTER — Ambulatory Visit: Payer: Medicare Other | Admitting: Family Medicine

## 2015-05-12 ENCOUNTER — Ambulatory Visit (INDEPENDENT_AMBULATORY_CARE_PROVIDER_SITE_OTHER): Payer: Medicare Other | Admitting: Family Medicine

## 2015-05-12 ENCOUNTER — Encounter: Payer: Self-pay | Admitting: Family Medicine

## 2015-05-12 VITALS — BP 152/84 | HR 81 | Ht 66.5 in | Wt 177.0 lb

## 2015-05-12 DIAGNOSIS — S76112D Strain of left quadriceps muscle, fascia and tendon, subsequent encounter: Secondary | ICD-10-CM | POA: Diagnosis not present

## 2015-05-12 NOTE — Patient Instructions (Signed)
Good to see you Bring the xray when you can.  Ice when you need it Keep monitoring it.  If it enlarges call me and I will get the MRI Otherwise lets check again in 6 weeks to make sure we are not getting bigger. Today 3.08cm.  Happy holidays!

## 2015-05-12 NOTE — Progress Notes (Signed)
Pre visit review using our clinic review tool, if applicable. No additional management support is needed unless otherwise documented below in the visit note. 

## 2015-05-12 NOTE — Assessment & Plan Note (Signed)
Still believe that this is more of a seroma. Once again we discussed about different differential's. Patient's area is freely mobile. This did occur after a injury some loss likely continues to be a muscle injury with a underlying seroma. Cancer is within the differential but patient has no signs or symptoms and seems to be improving. We discussed on MRI would be the more definitive test. Patient declined that again. We discussed we would like to make sure that this continues to decrease in size. Patient was going to come back again in 6 weeks for further evaluation. In the interim patient was given a compression sleeve, we'll continue to do some exercises and icing if needed. Anti-inflammatories if needed but patient states that he is pain-free. Return to clinic in 6 weeks.  Spent  25 minutes with patient face-to-face and had greater than 50% of counseling including as described above in assessment and plan.

## 2015-05-12 NOTE — Progress Notes (Signed)
[facility] [place], [place] [address] Phone: [phone] Subjective:    I'm seeing this patient by the request  of:  Walker Kehr, MD   CC: Left quadricep injury  [id number] Austin Torres is a 81 y.o. male coming in with complaint of patient states multiple months ago he did have an audible pop and had some mild pain. Patient previously was seen by an orthopedic physician still when and patient did have an MRI of the area. Patient did have x-rays at that time and we'll be bringing him for [place] to review him. We discussed different treatment options and patient elected to try to wait and see. States that he feels that the sizes decreasing. States the pain is significantly better. No longer taking any anti-inflammatory regularly. Attempted to find a compression sleeve but wasn't able to find the right size. Patient though states no fever, no chills, and no longer any weight loss. Feeling much better overall.     Past Medical History  Diagnosis Date  . Hyperlipidemia   . Hypertension   . Hypogonadism male   . BPH (benign prostatic hypertrophy)   . B12 deficiency   . Skin cancer   . GERD (gastroesophageal reflux disease)    Past Surgical History  Procedure Laterality Date  . Appendectomy    . Tonsillectomy    . Eye surgery      glaucoma procedure -both eyes  . Skin cancer excision      scalp   Social History  Substance Use Topics  . Smoking status: Current Every Day Smoker -- 1.00 packs/day    Types: Cigars  . Smokeless tobacco: Never Used  . Alcohol Use: 7.5 oz/week    15 drink(s) per week   No Known Allergies Family History  Problem Relation Age of Onset  . Heart disease Father   . Heart disease Brother     CAD/MI  . Cancer Neg Hx     no colon cancer, no prostate cancer  . Colon cancer Neg Hx   . Esophageal cancer Neg Hx   . Rectal cancer Neg Hx   . Stomach cancer Neg Hx      Past medical history, social,  surgical and family history all reviewed in electronic medical record.   Review of Systems: No headache, visual changes, nausea, vomiting, diarrhea, constipation, dizziness, abdominal pain, skin rash, fevers, chills, night sweats, weight loss, swollen lymph nodes, body aches, joint swelling, muscle aches, chest pain, shortness of breath, mood changes.   Objective Blood pressure 152/84, pulse 81, height 5' 6.5" (1.689 m), weight 177 lb (80.287 kg), SpO2 96 %.  General: No apparent distress alert and oriented x3 mood and affect normal, dressed appropriately.  HEENT: Pupils equal, extraocular movements intact  Respiratory: Patient's speak in full sentences and does not appear short of breath  Cardiovascular: No lower extremity edema, non tender, no erythema  Skin: Warm dry intact with no signs of infection or rash on extremities or on axial skeleton.  Abdomen: Soft nontender  Neuro: Cranial nerves II through XII are intact, neurovascularly intact in all extremities with 2+ DTRs and 2+ pulses.  Lymph: No lymphadenopathy of posterior or anterior cervical chain or axillae bilaterally.  Gait normal with good balance and coordination.  MSK:  Non tender with full range of motion and good stability and symmetric strength and tone of shoulders, elbows, wrist, hip, and ankles bilaterally.  Knee: Left Inspection reveals a patient has around  like structure noted approximately 8 cm from his knee on the anterior thigh. Nontender with some freely movable. Feels smaller than previous exam. ROM full in flexion and extension and lower leg rotation. Ligaments with solid consistent endpoints including ACL, PCL, LCL, MCL. Negative Mcmurray's, Apley's, and Thessalonian tests. Non painful patellar compression. Patellar glide without crepitus. Patellar and quadriceps tendons unremarkable. Hamstring and quadriceps strength is normal. Extensor mechanism intact Contralateral leg does have a skin graft but no signs of  infection.   MSK US performed of: Left quadriceps This study was ordered, performed, and interpreted by Charlann Boxer D.O.  Knee: All structures visualized. Quadricep muscle shows what seems to be a possible tear of the rectus femoris. Patient has increasing scar tissue formation. Patient still has hypoechoic changes. Measuring though 3.08 cm in diameter. This is smaller than the 3.78 previously. No increasing vascularity noted. No abnormality of prepatellar bursa. LCL and MCL unremarkable on long and transverse views. Images saved in hard drive  IMPRESSION:  Signs of healing from a muscle injury the scar tissue formation. Decreasing in size to 3.08 cm    Impression and Recommendations:     This case required medical decision making of moderate complexity.

## 2015-06-11 ENCOUNTER — Other Ambulatory Visit: Payer: Self-pay | Admitting: Internal Medicine

## 2015-06-23 ENCOUNTER — Ambulatory Visit: Payer: Medicare Other | Admitting: Family Medicine

## 2015-06-23 ENCOUNTER — Encounter: Payer: Self-pay | Admitting: Internal Medicine

## 2015-06-23 ENCOUNTER — Telehealth: Payer: Self-pay | Admitting: Internal Medicine

## 2015-06-23 ENCOUNTER — Ambulatory Visit (INDEPENDENT_AMBULATORY_CARE_PROVIDER_SITE_OTHER): Payer: Medicare Other | Admitting: Internal Medicine

## 2015-06-23 ENCOUNTER — Ambulatory Visit (INDEPENDENT_AMBULATORY_CARE_PROVIDER_SITE_OTHER)
Admission: RE | Admit: 2015-06-23 | Discharge: 2015-06-23 | Disposition: A | Discharge status: Home / Self Care | Payer: Medicare Other | Source: Ambulatory Visit | Attending: Internal Medicine | Admitting: Internal Medicine

## 2015-06-23 VITALS — BP 130/62 | HR 60 | Temp 98.9°F | Wt 177.0 lb

## 2015-06-23 DIAGNOSIS — E538 Deficiency of other specified B group vitamins: Secondary | ICD-10-CM | POA: Diagnosis not present

## 2015-06-23 DIAGNOSIS — J9801 Acute bronchospasm: Secondary | ICD-10-CM

## 2015-06-23 DIAGNOSIS — J189 Pneumonia, unspecified organism: Secondary | ICD-10-CM | POA: Insufficient documentation

## 2015-06-23 DIAGNOSIS — R05 Cough: Secondary | ICD-10-CM | POA: Diagnosis not present

## 2015-06-23 DIAGNOSIS — E559 Vitamin D deficiency, unspecified: Secondary | ICD-10-CM

## 2015-06-23 DIAGNOSIS — R0602 Shortness of breath: Secondary | ICD-10-CM | POA: Diagnosis not present

## 2015-06-23 MED ORDER — LEVOFLOXACIN 500 MG PO TABS: 500.0000 mg | ORAL_TABLET | Freq: Every day | ORAL | Status: DC

## 2015-06-23 MED ORDER — PROMETHAZINE-CODEINE 6.25-10 MG/5ML PO SYRP: 5.0000 mL | ORAL_SOLUTION | ORAL | Status: DC | PRN

## 2015-06-23 MED ORDER — UMECLIDINIUM-VILANTEROL 62.5-25 MCG/INH IN AEPB: 1.0000 | INHALATION_SPRAY | Freq: Every day | RESPIRATORY_TRACT | Status: DC

## 2015-06-23 MED ORDER — CEFTRIAXONE SODIUM 1 G IJ SOLR
1.0000 g | Freq: Once | INTRAMUSCULAR | Status: AC
  Administered 2015-06-23: 1 g via INTRAMUSCULAR

## 2015-06-23 NOTE — Patient Instructions (Signed)
Use over-the-counter  "cold" medicines  such as " Delsym" or" Robitussin" cough syrup varietis for cough.  You can use plain "Tylenol" or "Advil" for fever, chills and achyness. Use Halls or Ricola cough drops.  Please, make an appointment if you are not better or if you're worse.  

## 2015-06-23 NOTE — Assessment & Plan Note (Addendum)
1/17 due to CAP Anoro samples/Rx qd

## 2015-06-23 NOTE — Telephone Encounter (Signed)
Pt called wondering if Dr. Camila Li could work him in today (anytime) due to [employer], coughing and tight in his chest. Please advise, only want to see Dr. Camila Li.   Phone # [phone]

## 2015-06-23 NOTE — Progress Notes (Signed)
Pre visit review using our clinic review tool, if applicable. No additional management support is needed unless otherwise documented below in the visit note. 

## 2015-06-23 NOTE — Progress Notes (Signed)
Subjective:  Patient ID: Austin Torres, male    DOB: 1934/01/05  Age: 82 y.o. MRN: [id number]  CC: No chief complaint on file.   HPI Austin Torres presents for cough and SOB x1 week, green mucus  Outpatient Prescriptions Prior to Visit  Medication Sig Dispense Refill  . atenolol (TENORMIN) 50 MG tablet Take 1 tablet (50 mg total) by mouth 2 (two) times daily. 180 tablet 3  . BABY ASPIRIN PO Take 81 mg by mouth.     . losartan (COZAAR) 100 MG tablet Take 1 tablet (100 mg total) by mouth daily. 90 tablet 3  . naproxen (NAPROSYN) 500 MG tablet Take 1 tablet (500 mg total) by mouth daily as needed for moderate pain. 30 tablet 3  . omeprazole (PRILOSEC) 20 MG capsule TAKE 2 CAPSULES BY MOUTH ONCE DAILY 60 capsule 11  . sildenafil (REVATIO) 20 MG tablet TAKE 3 TABLETS BY MOUTH DAILY AS NEEDED 30 tablet 0  . traMADol (ULTRAM) 50 MG tablet Take 1 tablet (50 mg total) by mouth every 6 (six) hours as needed. 30 tablet 0   No facility-administered medications prior to visit.    ROS Review of Systems  Constitutional: Negative for appetite change, fatigue and unexpected weight change.  HENT: Positive for rhinorrhea and sinus pressure. Negative for congestion, nosebleeds, sneezing, sore throat and trouble swallowing.   Eyes: Negative for itching and visual disturbance.  Respiratory: Positive for cough, chest tightness, shortness of breath and wheezing.   Cardiovascular: Negative for chest pain, palpitations and leg swelling.  Gastrointestinal: Negative for nausea, diarrhea, blood in stool and abdominal distention.  Genitourinary: Negative for frequency and hematuria.  Musculoskeletal: Negative for back pain, joint swelling, gait problem and neck pain.  Skin: Negative for rash.  Neurological: Negative for dizziness, tremors, speech difficulty and weakness.  Psychiatric/Behavioral: Negative for sleep disturbance, dysphoric mood, decreased concentration and agitation. The patient is not  nervous/anxious.     Objective:  BP 130/62 mmHg  Pulse 60  Temp(Src) 98.9 F (37.2 C) (Oral)  Wt 177 lb (80.287 kg)  SpO2 94%  BP Readings from Last 3 Encounters:  06/23/15 130/62  05/12/15 152/84  04/15/15 150/72    Wt Readings from Last 3 Encounters:  06/23/15 177 lb (80.287 kg)  05/12/15 177 lb (80.287 kg)  04/15/15 178 lb (80.74 kg)    Physical Exam  Constitutional: He is oriented to person, place, and time. He appears well-developed. No distress.  NAD  HENT:  Mouth/Throat: Oropharynx is clear and moist.  Eyes: Conjunctivae are normal. Pupils are equal, round, and reactive to light. No scleral icterus.  Neck: Normal range of motion. No JVD present. No thyromegaly present.  Cardiovascular: Normal rate, regular rhythm, normal heart sounds and intact distal pulses.  Exam reveals no gallop and no friction rub.   No murmur heard. Pulmonary/Chest: Effort normal. No respiratory distress. He has wheezes. He has rales. He exhibits no tenderness.  Abdominal: Soft. Bowel sounds are normal. He exhibits no distension and no mass. There is no tenderness. There is no rebound and no guarding.  Musculoskeletal: Normal range of motion. He exhibits no edema or tenderness.  Lymphadenopathy:    He has no cervical adenopathy.  Neurological: He is alert and oriented to person, place, and time. He has normal reflexes. No cranial nerve deficit. He exhibits normal muscle tone. He displays a negative Romberg sign. Coordination and gait normal.  Skin: Skin is warm and dry. No rash noted.  Psychiatric: He has  a normal mood and affect. His behavior is normal. Judgment and thought content normal.  eryth throat  Lab Results  Component Value Date   WBC 8.3 01/16/2015   HGB 15.5 01/16/2015   HCT 45.8 01/16/2015   PLT 165.0 01/16/2015   GLUCOSE 124* 01/16/2015   CHOL 250* 01/16/2015   TRIG 169.0* 01/16/2015   HDL 51.70 01/16/2015   LDLDIRECT 155.3 02/15/2012   LDLCALC 164* 01/16/2015   ALT 13  01/16/2015   AST 16 01/16/2015   NA 138 01/16/2015   K 4.1 01/16/2015   CL 100 01/16/2015   CREATININE 1.12 01/16/2015   BUN 16 01/16/2015   CO2 27 01/16/2015   TSH 1.79 01/16/2015   PSA 2.33 01/16/2015    Dg Chest 2 View  05/08/2013  CLINICAL DATA:  Hypertension.  Loss of consciousness. EXAM: CHEST  2 VIEW COMPARISON:  May 27, 2010 FINDINGS: The heart size and mediastinal contours are stable. The aorta is tortuous. Both lungs are clear. The visualized skeletal structures are stable. IMPRESSION: No active cardiopulmonary disease. Electronically Signed   By: Abelardo Diesel M.D.   On: 05/08/2013 21:32    I personally provided Anoro inhaler use teaching. After the teaching patient was able to demonstrate it's use effectively. All questions were answered   Assessment & Plan:   Diagnoses and all orders for this visit:  CAP (community acquired pneumonia) -     DG Chest 2 View  Bronchial spasm -     DG Chest 2 View  Other orders -     promethazine-codeine (PHENERGAN WITH CODEINE) 6.25-10 MG/5ML syrup; Take 5 mLs by mouth every 4 (four) hours as needed. -     levofloxacin (LEVAQUIN) 500 MG tablet; Take 1 tablet (500 mg total) by mouth daily. -     Umeclidinium-Vilanterol (ANORO ELLIPTA) 62.5-25 MCG/INH AEPB; Inhale 1 puff into the lungs daily.  I am having Austin Torres start on promethazine-codeine, levofloxacin, and Umeclidinium-Vilanterol. I am also having him maintain his BABY ASPIRIN PO, traMADol, sildenafil, atenolol, losartan, naproxen, and omeprazole.  Meds ordered this encounter  Medications  . promethazine-codeine (PHENERGAN WITH CODEINE) 6.25-10 MG/5ML syrup    Sig: Take 5 mLs by mouth every 4 (four) hours as needed.    Dispense:  300 mL    Refill:  0  . levofloxacin (LEVAQUIN) 500 MG tablet    Sig: Take 1 tablet (500 mg total) by mouth daily.    Dispense:  10 tablet    Refill:  0  . Umeclidinium-Vilanterol (ANORO ELLIPTA) 62.5-25 MCG/INH AEPB    Sig: Inhale 1 puff  into the lungs daily.    Dispense:  1 each    Refill:  11     Follow-up: Return for a follow-up visit.  Walker Kehr, MD

## 2015-06-23 NOTE — Telephone Encounter (Signed)
Appt set for today at 0130

## 2015-06-23 NOTE — Assessment & Plan Note (Addendum)
1/17 ?B Levaquin x 10 Prom-cod syr CXR Anoro samples/Rx qd Rocephin 1 g IM

## 2015-06-23 NOTE — Assessment & Plan Note (Signed)
Re-start B12 po

## 2015-06-23 NOTE — Assessment & Plan Note (Signed)
Re-start Vit D 

## 2015-06-24 ENCOUNTER — Ambulatory Visit: Payer: Medicare Other | Admitting: Internal Medicine

## 2015-06-24 ENCOUNTER — Encounter: Payer: Self-pay | Admitting: Internal Medicine

## 2015-06-26 ENCOUNTER — Ambulatory Visit (INDEPENDENT_AMBULATORY_CARE_PROVIDER_SITE_OTHER): Payer: Medicare Other | Admitting: Internal Medicine

## 2015-06-26 ENCOUNTER — Encounter: Payer: Self-pay | Admitting: Internal Medicine

## 2015-06-26 VITALS — BP 148/90 | HR 56 | Wt 177.0 lb

## 2015-06-26 DIAGNOSIS — J189 Pneumonia, unspecified organism: Secondary | ICD-10-CM

## 2015-06-26 DIAGNOSIS — I10 Essential (primary) hypertension: Secondary | ICD-10-CM

## 2015-06-26 DIAGNOSIS — J9801 Acute bronchospasm: Secondary | ICD-10-CM

## 2015-06-26 MED ORDER — METHYLPREDNISOLONE ACETATE 80 MG/ML IJ SUSP
80.0000 mg | Freq: Once | INTRAMUSCULAR | Status: AC
  Administered 2015-06-26: 80 mg via INTRAMUSCULAR

## 2015-06-26 NOTE — Assessment & Plan Note (Signed)
On Atenolol 

## 2015-06-26 NOTE — Assessment & Plan Note (Signed)
Depo-medrol 80 mg IM 

## 2015-06-26 NOTE — Assessment & Plan Note (Addendum)
CXR ok 50% better  Finish abx Depomedrol IM now Prom-cod syr prn

## 2015-06-26 NOTE — Progress Notes (Signed)
Pre visit review using our clinic review tool, if applicable. No additional management support is needed unless otherwise documented below in the visit note. 

## 2015-06-26 NOTE — Progress Notes (Signed)
Subjective:  Patient ID: Austin Torres, male    DOB: [birth year]  Age: 82 y.o. MRN: [id number]  CC: No chief complaint on file.   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 F/u HTN. C/o some wheezing  Outpatient Prescriptions Prior to Visit  Medication Sig Dispense Refill  . atenolol (TENORMIN) 50 MG tablet Take 1 tablet (50 mg total) by mouth 2 (two) times daily. 180 tablet 3  . BABY ASPIRIN PO Take 81 mg by mouth.     . levofloxacin (LEVAQUIN) 500 MG tablet Take 1 tablet (500 mg total) by mouth daily. 10 tablet 0  . losartan (COZAAR) 100 MG tablet Take 1 tablet (100 mg total) by mouth daily. 90 tablet 3  . naproxen (NAPROSYN) 500 MG tablet Take 1 tablet (500 mg total) by mouth daily as needed for moderate pain. 30 tablet 3  . omeprazole (PRILOSEC) 20 MG capsule TAKE 2 CAPSULES BY MOUTH ONCE DAILY 60 capsule 11  . promethazine-codeine (PHENERGAN WITH CODEINE) 6.25-10 MG/5ML syrup Take 5 mLs by mouth every 4 (four) hours as needed. 300 mL 0  . sildenafil (REVATIO) 20 MG tablet TAKE 3 TABLETS BY MOUTH DAILY AS NEEDED 30 tablet 0  . traMADol (ULTRAM) 50 MG tablet Take 1 tablet (50 mg total) by mouth every 6 (six) hours as needed. 30 tablet 0  . Umeclidinium-Vilanterol (ANORO ELLIPTA) 62.5-25 MCG/INH AEPB Inhale 1 puff into the lungs daily. 1 each 11   No facility-administered medications prior to visit.    ROS Review of Systems  Constitutional: Negative for appetite change, fatigue and unexpected weight change.  HENT: Negative for congestion, nosebleeds, sneezing, sore throat and trouble swallowing.   Eyes: Negative for itching and visual disturbance.  Respiratory: Positive for cough and wheezing.   Cardiovascular: Negative for chest pain, palpitations and leg swelling.  Gastrointestinal: Negative for nausea, diarrhea, blood in stool and abdominal distention.  Genitourinary: Negative for frequency and hematuria.  Musculoskeletal: Negative for back pain, joint swelling,  gait problem and neck pain.  Skin: Negative for rash.  Neurological: Negative for dizziness, tremors, speech difficulty and weakness.  Psychiatric/Behavioral: Negative for sleep disturbance, dysphoric mood and agitation. The patient is not nervous/anxious.     Objective:  BP 148/90 mmHg  Pulse 56  Wt 177 lb (80.287 kg)  SpO2 97%  BP Readings from Last 3 Encounters:  06/26/15 148/90  06/23/15 130/62  05/12/15 152/84    Wt Readings from Last 3 Encounters:  06/26/15 177 lb (80.287 kg)  06/23/15 177 lb (80.287 kg)  05/12/15 177 lb (80.287 kg)    Physical Exam  Constitutional: He is oriented to person, place, and time. He appears well-developed. No distress.  NAD  HENT:  Mouth/Throat: Oropharynx is clear and moist.  Eyes: Conjunctivae are normal. Pupils are equal, round, and reactive to light.  Neck: Normal range of motion. No JVD present. No thyromegaly present.  Cardiovascular: Normal rate, regular rhythm, normal heart sounds and intact distal pulses.  Exam reveals no gallop and no friction rub.   No murmur heard. Pulmonary/Chest: Effort normal. No respiratory distress. He has wheezes. He has no rales. He exhibits no tenderness.  Abdominal: Soft. Bowel sounds are normal. He exhibits no distension and no mass. There is no tenderness. There is no rebound and no guarding.  Musculoskeletal: Normal range of motion. He exhibits no edema or tenderness.  Lymphadenopathy:    He has no cervical adenopathy.  Neurological: He is alert and oriented to  person, place, and time. He has normal reflexes. No cranial nerve deficit. He exhibits normal muscle tone. He displays a negative Romberg sign. Coordination and gait normal.  Skin: Skin is warm and dry. No rash noted.  Psychiatric: He has a normal mood and affect. His behavior is normal. Judgment and thought content normal.  R wheezes   Lab Results  Component Value Date   WBC 8.3 01/16/2015   HGB 15.5 01/16/2015   HCT 45.8 01/16/2015    PLT 165.0 01/16/2015   GLUCOSE 124* 01/16/2015   CHOL 250* 01/16/2015   TRIG 169.0* 01/16/2015   HDL 51.70 01/16/2015   LDLDIRECT 155.3 02/15/2012   LDLCALC 164* 01/16/2015   ALT 13 01/16/2015   AST 16 01/16/2015   NA 138 01/16/2015   K 4.1 01/16/2015   CL 100 01/16/2015   CREATININE 1.12 01/16/2015   BUN 16 01/16/2015   CO2 27 01/16/2015   TSH 1.79 01/16/2015   PSA 2.33 01/16/2015    Dg Chest 2 View  06/24/2015  CLINICAL DATA:  Cough, shortness of breath. EXAM: CHEST  2 VIEW COMPARISON:  May 08, 2013. FINDINGS: The heart size and mediastinal contours are within normal limits. Both lungs are clear. No pneumothorax or pleural effusion is noted. Atherosclerosis of descending thoracic aorta is noted. The visualized skeletal structures are unremarkable. IMPRESSION: No active cardiopulmonary disease. Electronically Signed   By: Marijo Conception, M.D.   On: 06/24/2015 08:17    Assessment & Plan:   Diagnoses and all orders for this visit:  Bronchial spasm  CAP (community acquired pneumonia) -     methylPREDNISolone acetate (DEPO-MEDROL) injection 80 mg; Inject 1 mL (80 mg total) into the muscle once.   I am having Mr. Lister maintain his BABY ASPIRIN PO, traMADol, sildenafil, atenolol, losartan, naproxen, omeprazole, promethazine-codeine, levofloxacin, and Umeclidinium-Vilanterol. We administered methylPREDNISolone acetate.  Meds ordered this encounter  Medications  . methylPREDNISolone acetate (DEPO-MEDROL) injection 80 mg    Sig:      Follow-up: No Follow-up on file.  Walker Kehr, MD

## 2015-07-14 ENCOUNTER — Ambulatory Visit: Payer: Medicare Other | Admitting: Family Medicine

## 2015-07-16 ENCOUNTER — Telehealth: Payer: Self-pay | Admitting: Internal Medicine

## 2015-07-16 DIAGNOSIS — J449 Chronic obstructive pulmonary disease, unspecified: Secondary | ICD-10-CM

## 2015-07-16 NOTE — Telephone Encounter (Signed)
I'll enter the referral again - sorry! Thx

## 2015-07-16 NOTE — Telephone Encounter (Signed)
Patient called in to check on the referral he spoke to [place] about re: pulmo capacity test. Advised that i do not see a referral on file at this time, please issue referral to pulmo for patient

## 2015-07-17 NOTE — Telephone Encounter (Signed)
Pt advised.

## 2015-07-18 ENCOUNTER — Ambulatory Visit (INDEPENDENT_AMBULATORY_CARE_PROVIDER_SITE_OTHER): Payer: Medicare Other | Admitting: Internal Medicine

## 2015-07-18 ENCOUNTER — Other Ambulatory Visit: Payer: Self-pay | Admitting: Internal Medicine

## 2015-07-18 DIAGNOSIS — R0600 Dyspnea, unspecified: Secondary | ICD-10-CM

## 2015-07-18 LAB — PULMONARY FUNCTION TEST
DL/VA % pred: 84 %
DL/VA: 3.65 ml/min/mmHg/L
DLCO unc % pred: 71 %
DLCO unc: 19.3 ml/min/mmHg
FEF 25-75 Post: 1.15 L/sec
FEF 25-75 Pre: 1.22 L/sec
FEF2575-%CHANGE-POST: -5 %
FEF2575-%Pred-Post: 70 %
FEF2575-%Pred-Pre: 74 %
FEV1-%Change-Post: -2 %
FEV1-%PRED-POST: 91 %
FEV1-%Pred-Pre: 93 %
FEV1-POST: 2.2 L
FEV1-PRE: 2.25 L
FEV1FVC-%CHANGE-POST: 5 %
FEV1FVC-%Pred-Pre: 96 %
FEV6-%CHANGE-POST: -6 %
FEV6-%PRED-PRE: 101 %
FEV6-%Pred-Post: 95 %
FEV6-PRE: 3.21 L
FEV6-Post: 3 L
FEV6FVC-%Change-Post: 1 %
FEV6FVC-%PRED-PRE: 106 %
FEV6FVC-%Pred-Post: 107 %
FVC-%Change-Post: -7 %
FVC-%PRED-PRE: 95 %
FVC-%Pred-Post: 88 %
FVC-POST: 3 L
FVC-PRE: 3.25 L
POST FEV6/FVC RATIO: 100 %
Post FEV1/FVC ratio: 73 %
Pre FEV1/FVC ratio: 69 %
Pre FEV6/FVC Ratio: 99 %
RV % PRED: 101 %
RV: 2.47 L
TLC % pred: 92 %
TLC: 5.76 L

## 2015-07-18 NOTE — Progress Notes (Signed)
PFT done today. 

## 2015-07-30 ENCOUNTER — Encounter: Payer: Self-pay | Admitting: Internal Medicine

## 2015-07-30 ENCOUNTER — Ambulatory Visit (INDEPENDENT_AMBULATORY_CARE_PROVIDER_SITE_OTHER): Payer: Medicare Other | Admitting: Internal Medicine

## 2015-07-30 VITALS — BP 160/90 | HR 59 | Wt 177.0 lb

## 2015-07-30 DIAGNOSIS — J189 Pneumonia, unspecified organism: Secondary | ICD-10-CM | POA: Diagnosis not present

## 2015-07-30 DIAGNOSIS — E538 Deficiency of other specified B group vitamins: Secondary | ICD-10-CM | POA: Diagnosis not present

## 2015-07-30 DIAGNOSIS — I10 Essential (primary) hypertension: Secondary | ICD-10-CM

## 2015-07-30 DIAGNOSIS — S76112D Strain of left quadriceps muscle, fascia and tendon, subsequent encounter: Secondary | ICD-10-CM

## 2015-07-30 DIAGNOSIS — J449 Chronic obstructive pulmonary disease, unspecified: Secondary | ICD-10-CM | POA: Insufficient documentation

## 2015-07-30 DIAGNOSIS — E559 Vitamin D deficiency, unspecified: Secondary | ICD-10-CM | POA: Diagnosis not present

## 2015-07-30 NOTE — Assessment & Plan Note (Addendum)
Austin Torres won't be able to use a car breathalyzer dye to COPD 2017 pt states it is hard for him to blow air out w/force (not a new problem, worse after recent pneumonia). He is SOB at times. He smokes cigars - rare now.

## 2015-07-30 NOTE — Assessment & Plan Note (Signed)
Vit D 

## 2015-07-30 NOTE — Assessment & Plan Note (Signed)
On B12 

## 2015-07-30 NOTE — Assessment & Plan Note (Addendum)
Clinically resolved 07/2015 pt states SOB is worse after recent pneumonia. He is SOB at times. He smokes cigars - rare now.

## 2015-07-30 NOTE — Assessment & Plan Note (Signed)
On Atenolol 

## 2015-07-30 NOTE — Progress Notes (Signed)
Subjective:  Patient ID: Austin Torres, male    DOB: May 24, 1934  Age: 82 y.o. MRN: [id number]  CC: No chief complaint on file.   HPI   Austin Torres presents for COPD f/u - pt states it is hard for him to blow air out w/force (not a new problem, worse after recent pneumonia). He is SOB at times. He smokes cigars - rare now. No ETOH.    Outpatient Prescriptions Prior to Visit  Medication Sig Dispense Refill  . atenolol (TENORMIN) 50 MG tablet Take 1 tablet (50 mg total) by mouth 2 (two) times daily. 180 tablet 3  . BABY ASPIRIN PO Take 81 mg by mouth.     . losartan (COZAAR) 100 MG tablet Take 1 tablet (100 mg total) by mouth daily. 90 tablet 3  . naproxen (NAPROSYN) 500 MG tablet Take 1 tablet (500 mg total) by mouth daily as needed for moderate pain. 30 tablet 3  . omeprazole (PRILOSEC) 20 MG capsule TAKE 2 CAPSULES BY MOUTH ONCE DAILY 60 capsule 11  . promethazine-codeine (PHENERGAN WITH CODEINE) 6.25-10 MG/5ML syrup Take 5 mLs by mouth every 4 (four) hours as needed. 300 mL 0  . sildenafil (REVATIO) 20 MG tablet TAKE 3 TABLETS BY MOUTH DAILY AS NEEDED 30 tablet 0  . traMADol (ULTRAM) 50 MG tablet Take 1 tablet (50 mg total) by mouth every 6 (six) hours as needed. 30 tablet 0  . Umeclidinium-Vilanterol (ANORO ELLIPTA) 62.5-25 MCG/INH AEPB Inhale 1 puff into the lungs daily. 1 each 11  . levofloxacin (LEVAQUIN) 500 MG tablet Take 1 tablet (500 mg total) by mouth daily. (Patient not taking: Reported on 07/30/2015) 10 tablet 0   No facility-administered medications prior to visit.    ROS Review of Systems  Constitutional: Negative for appetite change, fatigue and unexpected weight change.  HENT: Negative for congestion, nosebleeds, sneezing, sore throat and trouble swallowing.   Eyes: Negative for itching and visual disturbance.  Respiratory: Positive for shortness of breath. Negative for cough.   Cardiovascular: Negative for chest pain, palpitations and leg swelling.    Gastrointestinal: Negative for nausea, diarrhea, blood in stool and abdominal distention.  Genitourinary: Negative for frequency and hematuria.  Musculoskeletal: Negative for back pain, joint swelling, gait problem and neck pain.  Skin: Negative for rash.  Neurological: Negative for dizziness, tremors, speech difficulty and weakness.  Psychiatric/Behavioral: Negative for sleep disturbance, dysphoric mood and agitation. The patient is not nervous/anxious.     Objective:  BP 160/90 mmHg  Pulse 59  Wt 177 lb (80.287 kg)  SpO2 97%  BP Readings from Last 3 Encounters:  07/30/15 160/90  06/26/15 148/90  06/23/15 130/62    Wt Readings from Last 3 Encounters:  07/30/15 177 lb (80.287 kg)  06/26/15 177 lb (80.287 kg)  06/23/15 177 lb (80.287 kg)    Physical Exam  Constitutional: He is oriented to person, place, and time. He appears well-developed. No distress.  NAD  HENT:  Mouth/Throat: Oropharynx is clear and moist.  Eyes: Conjunctivae are normal. Pupils are equal, round, and reactive to light.  Neck: Normal range of motion. No JVD present. No thyromegaly present.  Cardiovascular: Normal rate, regular rhythm, normal heart sounds and intact distal pulses.  Exam reveals no gallop and no friction rub.   No murmur heard. Pulmonary/Chest: Effort normal and breath sounds normal. No respiratory distress. He has no wheezes. He has no rales. He exhibits no tenderness.  Abdominal: Soft. Bowel sounds are normal. He exhibits no  distension and no mass. There is no tenderness. There is no rebound and no guarding.  Musculoskeletal: Normal range of motion. He exhibits no edema or tenderness.  Lymphadenopathy:    He has no cervical adenopathy.  Neurological: He is alert and oriented to person, place, and time. He has normal reflexes. No cranial nerve deficit. He exhibits normal muscle tone. He displays a negative Romberg sign. Coordination and gait normal.  Skin: Skin is warm and dry. No rash  noted.  Psychiatric: He has a normal mood and affect. His behavior is normal. Judgment and thought content normal.    Lab Results  Component Value Date   WBC 8.3 01/16/2015   HGB 15.5 01/16/2015   HCT 45.8 01/16/2015   PLT 165.0 01/16/2015   GLUCOSE 124* 01/16/2015   CHOL 250* 01/16/2015   TRIG 169.0* 01/16/2015   HDL 51.70 01/16/2015   LDLDIRECT 155.3 02/15/2012   LDLCALC 164* 01/16/2015   ALT 13 01/16/2015   AST 16 01/16/2015   NA 138 01/16/2015   K 4.1 01/16/2015   CL 100 01/16/2015   CREATININE 1.12 01/16/2015   BUN 16 01/16/2015   CO2 27 01/16/2015   TSH 1.79 01/16/2015   PSA 2.33 01/16/2015    Dg Chest 2 View  06/24/2015  CLINICAL DATA:  Cough, shortness of breath. EXAM: CHEST  2 VIEW COMPARISON:  May 08, 2013. FINDINGS: The heart size and mediastinal contours are within normal limits. Both lungs are clear. No pneumothorax or pleural effusion is noted. Atherosclerosis of descending thoracic aorta is noted. The visualized skeletal structures are unremarkable. IMPRESSION: No active cardiopulmonary disease. Electronically Signed   By: Marijo Conception, M.D.   On: 06/24/2015 08:17    Assessment & Plan:   Diagnoses and all orders for this visit:  COPD mixed type ([place])  CAP (community acquired pneumonia)  B12 deficiency  Vitamin D deficiency  Quadriceps tendon rupture, left, subsequent encounter  Essential hypertension   I have discontinued Mr. Szot levofloxacin. I am also having him maintain his BABY ASPIRIN PO, traMADol, sildenafil, atenolol, losartan, naproxen, omeprazole, promethazine-codeine, and umeclidinium-vilanterol.  No orders of the defined types were placed in this encounter.     Follow-up: Return in about 3 months (around 10/27/2015) for a follow-up visit.  Walker Kehr, MD

## 2015-07-30 NOTE — Progress Notes (Signed)
Pre visit review using our clinic review tool, if applicable. No additional management support is needed unless otherwise documented below in the visit note. 

## 2015-07-30 NOTE — Assessment & Plan Note (Signed)
Better  

## 2015-08-08 ENCOUNTER — Institutional Professional Consult (permissible substitution): Payer: Medicare Other | Admitting: Pulmonary Disease

## 2015-08-20 DIAGNOSIS — D485 Neoplasm of uncertain behavior of skin: Secondary | ICD-10-CM | POA: Diagnosis not present

## 2015-08-20 DIAGNOSIS — Z85820 Personal history of malignant melanoma of skin: Secondary | ICD-10-CM | POA: Diagnosis not present

## 2015-08-20 DIAGNOSIS — D225 Melanocytic nevi of trunk: Secondary | ICD-10-CM | POA: Diagnosis not present

## 2015-08-20 DIAGNOSIS — L570 Actinic keratosis: Secondary | ICD-10-CM | POA: Diagnosis not present

## 2015-08-20 DIAGNOSIS — Z85828 Personal history of other malignant neoplasm of skin: Secondary | ICD-10-CM | POA: Diagnosis not present

## 2015-08-27 ENCOUNTER — Ambulatory Visit: Payer: Medicare Other | Admitting: Family Medicine

## 2015-10-06 ENCOUNTER — Ambulatory Visit (INDEPENDENT_AMBULATORY_CARE_PROVIDER_SITE_OTHER): Payer: Medicare Other | Admitting: Family

## 2015-10-06 ENCOUNTER — Encounter: Payer: Self-pay | Admitting: Family

## 2015-10-06 VITALS — BP 140/84 | HR 62 | Temp 98.1°F | Ht 66.5 in | Wt 176.0 lb

## 2015-10-06 DIAGNOSIS — W57XXXA Bitten or stung by nonvenomous insect and other nonvenomous arthropods, initial encounter: Secondary | ICD-10-CM | POA: Diagnosis not present

## 2015-10-06 DIAGNOSIS — T148 Other injury of unspecified body region: Secondary | ICD-10-CM

## 2015-10-06 MED ORDER — DOXYCYCLINE HYCLATE 100 MG PO TABS: ORAL_TABLET | ORAL | Status: DC

## 2015-10-06 NOTE — Progress Notes (Signed)
Pre visit review using our clinic review tool, if applicable. No additional management support is needed unless otherwise documented below in the visit note. 

## 2015-10-06 NOTE — Patient Instructions (Signed)
If there is no improvement in your symptoms, or if there is any worsening of symptoms, or if you have any additional concerns, please return for re-evaluation; or, if we are closed, consider going to the Emergency Room for evaluation if symptoms urgent.    Tick Bite Information Ticks are insects that attach themselves to the skin and draw blood for food. There are various types of ticks. Common types include wood ticks and deer ticks. Most ticks live in shrubs and grassy areas. Ticks can climb onto your body when you make contact with leaves or grass where the tick is waiting. The most common places on the body for ticks to attach themselves are the scalp, neck, armpits, waist, and groin. Most tick bites are harmless, but sometimes ticks carry germs that cause diseases. These germs can be spread to a person during the tick's feeding process. The chance of a disease spreading through a tick bite depends on:   The type of tick.  Time of year.   How long the tick is attached.   Geographic location.  HOW CAN YOU PREVENT TICK BITES? Take these steps to help prevent tick bites when you are outdoors:  Wear protective clothing. Long sleeves and long pants are best.   Wear white clothes so you can see ticks more easily.  Tuck your pant legs into your socks.   If walking on a trail, stay in the middle of the trail to avoid brushing against bushes.  Avoid walking through areas with long grass.  Put insect repellent on all exposed skin and along boot tops, pant legs, and sleeve cuffs.   Check clothing, hair, and skin repeatedly and before going inside.   Brush off any ticks that are not attached.  Take a shower or bath as soon as possible after being outdoors.  WHAT IS THE PROPER WAY TO REMOVE A TICK? Ticks should be removed as soon as possible to help prevent diseases caused by tick bites. 1. If latex gloves are available, put them on before trying to remove a tick.  2. Using  fine-point tweezers, grasp the tick as close to the skin as possible. You may also use curved forceps or a tick removal tool. Grasp the tick as close to its head as possible. Avoid grasping the tick on its body. 3. Pull gently with steady upward pressure until the tick lets go. Do not twist the tick or jerk it suddenly. This may break off the tick's head or mouth parts. 4. Do not squeeze or crush the tick's body. This could force disease-carrying fluids from the tick into your body.  5. After the tick is removed, wash the bite area and your hands with soap and water or other disinfectant such as alcohol. 6. Apply a small amount of antiseptic cream or ointment to the bite site.  7. Wash and disinfect any instruments that were used.  Do not try to remove a tick by applying a hot match, petroleum jelly, or fingernail polish to the tick. These methods do not work and may increase the chances of disease being spread from the tick bite.  WHEN SHOULD YOU SEEK MEDICAL CARE? Contact your health care provider if you are unable to remove a tick from your skin or if a part of the tick breaks off and is stuck in the skin.  After a tick bite, you need to be aware of signs and symptoms that could be related to diseases spread by ticks. Contact your health  care provider if you develop any of the following in the days or weeks after the tick bite:  Unexplained fever.  Rash. A circular rash that appears days or weeks after the tick bite may indicate the possibility of Lyme disease. The rash may resemble a target with a bull's-eye and may occur at a different part of your body than the tick bite.  Redness and swelling in the area of the tick bite.   Tender, swollen lymph glands.   Diarrhea.   Weight loss.   Cough.   Fatigue.   Muscle, joint, or bone pain.   Abdominal pain.   Headache.   Lethargy or a change in your level of consciousness.  Difficulty walking or moving your legs.    Numbness in the legs.   Paralysis.  Shortness of breath.   Confusion.   Repeated vomiting.    This information is not intended to replace advice given to you by your health care provider. Make sure you discuss any questions you have with your health care provider.   Document Released: 05/21/2000 Document Revised: 06/14/2014 Document Reviewed: 11/01/2012 Elsevier Interactive Patient Education [employer].

## 2015-10-06 NOTE — Progress Notes (Signed)
Subjective:    Patient ID: Austin Torres, male    DOB: 09/16/1933, 82 y.o.   MRN: [id number]   Austin Torres is a 82 y.o. male who presents today for an acute visit.    HPI Comments: Patient here for evaluation of tick bite. Pulled tick off yesterday morning in right groin area, raised, reddened. Unsure how long tick was attached. It is still itchy, applied some alcohol and neosporin with mild relief. Removed entire tick with tweezers. White dot.   Past Medical History  Diagnosis Date  . Hyperlipidemia   . Hypertension   . Hypogonadism male   . BPH (benign prostatic hypertrophy)   . B12 deficiency   . Skin cancer   . GERD (gastroesophageal reflux disease)    Review of patient's allergies indicates no known allergies. Current Outpatient Prescriptions on File Prior to Visit  Medication Sig Dispense Refill  . atenolol (TENORMIN) 50 MG tablet Take 1 tablet (50 mg total) by mouth 2 (two) times daily. 180 tablet 3  . BABY ASPIRIN PO Take 81 mg by mouth.     . losartan (COZAAR) 100 MG tablet Take 1 tablet (100 mg total) by mouth daily. 90 tablet 3  . naproxen (NAPROSYN) 500 MG tablet Take 1 tablet (500 mg total) by mouth daily as needed for moderate pain. 30 tablet 3  . omeprazole (PRILOSEC) 20 MG capsule TAKE 2 CAPSULES BY MOUTH ONCE DAILY 60 capsule 11  . promethazine-codeine (PHENERGAN WITH CODEINE) 6.25-10 MG/5ML syrup Take 5 mLs by mouth every 4 (four) hours as needed. 300 mL 0  . sildenafil (REVATIO) 20 MG tablet TAKE 3 TABLETS BY MOUTH DAILY AS NEEDED 30 tablet 0  . traMADol (ULTRAM) 50 MG tablet Take 1 tablet (50 mg total) by mouth every 6 (six) hours as needed. 30 tablet 0  . Umeclidinium-Vilanterol (ANORO ELLIPTA) 62.5-25 MCG/INH AEPB Inhale 1 puff into the lungs daily. 1 each 11   No current facility-administered medications on file prior to visit.    Social History  Substance Use Topics  . Smoking status: Current Every Day Smoker -- 1.00 packs/day    Types: Cigars    . Smokeless tobacco: Never Used  . Alcohol Use: No     Comment: quit drinking 2017    Review of Systems  Constitutional: Negative for fever and chills.  HENT: Negative for congestion.   Respiratory: Negative for cough.   Cardiovascular: Negative for chest pain and palpitations.  Gastrointestinal: Negative for nausea, vomiting and diarrhea.  Musculoskeletal: Negative for myalgias and arthralgias.  Skin: Negative for rash.  Neurological: Negative for headaches.      Objective:    BP 140/84 mmHg  Pulse 62  Temp(Src) 98.1 F (36.7 C) (Oral)  Ht 5' 6.5" (1.689 m)  Wt 176 lb (79.833 kg)  BMI 27.98 kg/m2  SpO2 97%   Physical Exam  Constitutional: He appears well-developed and well-nourished.  Cardiovascular: Regular rhythm and normal heart sounds.   Pulmonary/Chest: Effort normal and breath sounds normal. No respiratory distress. He has no wheezes. He has no rhonchi. He has no rales.  Lymphadenopathy:       Head (left side): No submandibular and no preauricular adenopathy present.  Neurological: He is alert.  Skin: Skin is warm and dry. Rash noted. Rash is papular.     Nondraining papule right groin. Surrounding erythema. No bulls eye. Skin intact. No streaking, swelling or pieces of tick seen.     Psychiatric: He has a normal mood  and affect. His speech is normal and behavior is normal.  Vitals reviewed.      Assessment & Plan:   1. Tick bite Patient and I agreed to treat empirically as unsure how long tick was attached.  - doxycycline (VIBRA-TABS) 100 MG tablet; Take two tablets by mouth ( total of 200 mg) once for lyme prophylaxis.  Dispense: 2 tablet; Refill: 0   I am having Mr. Mattern maintain his BABY ASPIRIN PO, traMADol, sildenafil, atenolol, losartan, naproxen, omeprazole, promethazine-codeine, and umeclidinium-vilanterol.   No orders of the defined types were placed in this encounter.     Start medications as prescribed and explained to patient on  After Visit Summary ( AVS). Risks, benefits, and alternatives of the medications and treatment plan prescribed today were discussed, and patient expressed understanding.   Education regarding symptom management and diagnosis given to patient.   Follow-up:Plan follow-up as discussed or as needed if any worsening symptoms or change in condition. No Follow-up on file.   Continue to follow with Walker Kehr, MD for routine health maintenance.   [name] and I agreed with plan.   Mable Paris, FNP

## 2015-11-28 ENCOUNTER — Encounter: Payer: Self-pay | Admitting: Internal Medicine

## 2015-11-28 ENCOUNTER — Ambulatory Visit (INDEPENDENT_AMBULATORY_CARE_PROVIDER_SITE_OTHER): Payer: Medicare Other | Admitting: Internal Medicine

## 2015-11-28 VITALS — BP 132/64 | HR 55 | Ht 66.5 in | Wt 177.8 lb

## 2015-11-28 VITALS — BP 140/78 | HR 50 | Wt 177.0 lb

## 2015-11-28 DIAGNOSIS — N400 Enlarged prostate without lower urinary tract symptoms: Secondary | ICD-10-CM | POA: Diagnosis not present

## 2015-11-28 DIAGNOSIS — E538 Deficiency of other specified B group vitamins: Secondary | ICD-10-CM | POA: Diagnosis not present

## 2015-11-28 DIAGNOSIS — J449 Chronic obstructive pulmonary disease, unspecified: Secondary | ICD-10-CM | POA: Diagnosis not present

## 2015-11-28 DIAGNOSIS — M19019 Primary osteoarthritis, unspecified shoulder: Secondary | ICD-10-CM

## 2015-11-28 DIAGNOSIS — I10 Essential (primary) hypertension: Secondary | ICD-10-CM

## 2015-11-28 DIAGNOSIS — E559 Vitamin D deficiency, unspecified: Secondary | ICD-10-CM

## 2015-11-28 DIAGNOSIS — M129 Arthropathy, unspecified: Secondary | ICD-10-CM

## 2015-11-28 MED ORDER — CYANOCOBALAMIN 1000 MCG/ML IJ SOLN
1000.0000 ug | Freq: Once | INTRAMUSCULAR | Status: AC
  Administered 2015-11-28: 1000 ug via INTRAMUSCULAR

## 2015-11-28 MED ORDER — TRAMADOL HCL 50 MG PO TABS: 25.0000 mg | ORAL_TABLET | Freq: Two times a day (BID) | ORAL | Status: DC | PRN

## 2015-11-28 MED ORDER — VITAMIN B-12 1000 MCG SL SUBL: 1.0000 | SUBLINGUAL_TABLET | Freq: Every day | SUBLINGUAL | Status: DC

## 2015-11-28 MED ORDER — ALFUZOSIN HCL ER 10 MG PO TB24: 10.0000 mg | ORAL_TABLET | Freq: Every day | ORAL | Status: DC

## 2015-11-28 NOTE — Progress Notes (Signed)
   Subjective:    Patient ID: Austin Torres, male    DOB: 07/23/1933,     MRN: [id number]  HPI  5  yowm never regular smoker mostly cigar smoker with onset sob in the 1990's and much worse since 2012 but not on maint rx referred to pulmonary clinic 11/28/2015 by Dr  Alain Marion for copd eval.   11/28/2015 1st [facility] office visit/ Austin Torres   Chief Complaint  Patient presents with  . Pulmonary Consult    Referred by Dr. Alain Marion. Pt states here to have spirometry done in order to get form signed for ingnition interlock waiver for the [facility].   doe = MMRC1 = can walk nl pace, flat grade, can't hurry or go uphills or steps s sob   Main limitation is blowing ignition lock out  Never able to play a horn  No obvious other patterns in day to day or daytime variabilty or assoc chronic cough or cp or chest tightness, subjective wheeze overt sinus or hb symptoms. No unusual exp hx or h/o childhood pna/ asthma or knowledge of premature birth.  Sleeping ok without nocturnal  or early am exacerbation  of respiratory  c/o's or need for noct saba. Also denies any obvious fluctuation of symptoms with weather or environmental changes or other aggravating or alleviating factors except as outlined above   Current Medications, Allergies, Complete Past Medical History, Past Surgical History, Family History, and Social History were reviewed in [employer] record.           Review of Systems  Constitutional: Negative for fever, chills, activity change, appetite change and unexpected weight change.  HENT: Negative for congestion, dental problem, postnasal drip, rhinorrhea, sneezing, sore throat, trouble swallowing and voice change.   Eyes: Negative for visual disturbance.  Respiratory: Positive for cough. Negative for choking and shortness of breath.   Cardiovascular: Negative for chest pain and leg swelling.  Gastrointestinal: Negative for nausea, vomiting and abdominal pain.    Genitourinary: Negative for difficulty urinating.  Musculoskeletal: Negative for arthralgias.  Skin: Negative for rash.  Psychiatric/Behavioral: Negative for behavioral problems and confusion.       Objective:   Physical Exam  amb wm with hard [place] accent  Wt Readings from Last 3 Encounters:  11/28/15 177 lb 12.8 oz (80.65 kg)  11/28/15 177 lb (80.287 kg)  10/06/15 176 lb (79.833 kg)    Vital signs reviewed   HEENT: nl dentition, turbinates, and oropharynx. Nl external ear canals without cough reflex   NECK :  without JVD/Nodes/TM/ nl carotid upstrokes bilaterally   LUNGS: no acc muscle use,  Nl contour chest with slt  CV:  RRR  no s3 or murmur or increase in P2, no edema   ABD:  Obese/ soft and nontender with mid  inspiratory hoover's in the supine position. No bruits or organomegaly, bowel sounds nl  MS:  Nl gait/ ext warm without deformities, calf tenderness, cyanosis or clubbing No obvious joint restrictions   SKIN: warm and dry without lesions    NEURO:  alert, approp, nl sensorium with  no motor deficits      I personally reviewed images and agree with radiology impression as follows:  CXR:  06/24/15 No active cardiopulmonary disease.         Assessment & Plan:

## 2015-11-28 NOTE — Assessment & Plan Note (Signed)
Re-start Vit D 

## 2015-11-28 NOTE — Assessment & Plan Note (Signed)
Mr Austin Torres won't be able to use a car breathalyzer dye to COPD 2017 pt states it is hard for him to blow air out w/force (not a new problem, worse after recent pneumonia). He is SOB at times. He smokes cigars - rare now.

## 2015-11-28 NOTE — Progress Notes (Signed)
Subjective:  Patient ID: Austin Torres, male    DOB: 08/16/1933  Age: 82 y.o. MRN: [id number]  CC: No chief complaint on file.   HPI Austin Torres presents for COPD, HTN, BPH. C/o dizziness w/Doxazosin. Drinks very little.  Outpatient Prescriptions Prior to Visit  Medication Sig Dispense Refill  . atenolol (TENORMIN) 50 MG tablet Take 1 tablet (50 mg total) by mouth 2 (two) times daily. 180 tablet 3  . BABY ASPIRIN PO Take 81 mg by mouth.     . doxycycline (VIBRA-TABS) 100 MG tablet Take two tablets by mouth ( total of 200 mg) once for lyme prophylaxis. 2 tablet 0  . losartan (COZAAR) 100 MG tablet Take 1 tablet (100 mg total) by mouth daily. 90 tablet 3  . naproxen (NAPROSYN) 500 MG tablet Take 1 tablet (500 mg total) by mouth daily as needed for moderate pain. 30 tablet 3  . omeprazole (PRILOSEC) 20 MG capsule TAKE 2 CAPSULES BY MOUTH ONCE DAILY 60 capsule 11  . promethazine-codeine (PHENERGAN WITH CODEINE) 6.25-10 MG/5ML syrup Take 5 mLs by mouth every 4 (four) hours as needed. 300 mL 0  . sildenafil (REVATIO) 20 MG tablet TAKE 3 TABLETS BY MOUTH DAILY AS NEEDED 30 tablet 0  . traMADol (ULTRAM) 50 MG tablet Take 1 tablet (50 mg total) by mouth every 6 (six) hours as needed. 30 tablet 0  . Umeclidinium-Vilanterol (ANORO ELLIPTA) 62.5-25 MCG/INH AEPB Inhale 1 puff into the lungs daily. 1 each 11   No facility-administered medications prior to visit.    ROS Review of Systems  Constitutional: Negative for appetite change, fatigue and unexpected weight change.  HENT: Negative for congestion, nosebleeds, sneezing, sore throat and trouble swallowing.   Eyes: Negative for itching and visual disturbance.  Respiratory: Positive for shortness of breath. Negative for cough.   Cardiovascular: Negative for chest pain, palpitations and leg swelling.  Gastrointestinal: Negative for nausea, diarrhea, blood in stool and abdominal distention.  Genitourinary: Positive for urgency. Negative for  frequency and hematuria.  Musculoskeletal: Negative for back pain, joint swelling, gait problem and neck pain.  Skin: Negative for rash.  Neurological: Negative for dizziness, tremors, speech difficulty, weakness and light-headedness.  Psychiatric/Behavioral: Negative for suicidal ideas, sleep disturbance, dysphoric mood and agitation. The patient is not nervous/anxious.   Mild DOE  Objective:  BP 140/78 mmHg  Pulse 50  Wt 177 lb (80.287 kg)  SpO2 96%  BP Readings from Last 3 Encounters:  11/28/15 140/78  10/06/15 140/84  07/30/15 160/90    Wt Readings from Last 3 Encounters:  11/28/15 177 lb (80.287 kg)  10/06/15 176 lb (79.833 kg)  07/30/15 177 lb (80.287 kg)    Physical Exam  Constitutional: He is oriented to person, place, and time. He appears well-developed. No distress.  NAD  HENT:  Mouth/Throat: Oropharynx is clear and moist.  Eyes: Conjunctivae are normal. Pupils are equal, round, and reactive to light.  Neck: Normal range of motion. No JVD present. No thyromegaly present.  Cardiovascular: Normal rate, regular rhythm, normal heart sounds and intact distal pulses.  Exam reveals no gallop and no friction rub.   No murmur heard. Pulmonary/Chest: Effort normal and breath sounds normal. No respiratory distress. He has no wheezes. He has no rales. He exhibits no tenderness.  Abdominal: Soft. Bowel sounds are normal. He exhibits no distension and no mass. There is no tenderness. There is no rebound and no guarding.  Musculoskeletal: Normal range of motion. He exhibits no edema or  tenderness.  Lymphadenopathy:    He has no cervical adenopathy.  Neurological: He is alert and oriented to person, place, and time. He has normal reflexes. No cranial nerve deficit. He exhibits normal muscle tone. He displays a negative Romberg sign. Coordination and gait normal.  Skin: Skin is warm and dry. No rash noted.  Psychiatric: He has a normal mood and affect. His behavior is normal.  Judgment and thought content normal.  Decreased BS B  Lab Results  Component Value Date   WBC 8.3 01/16/2015   HGB 15.5 01/16/2015   HCT 45.8 01/16/2015   PLT 165.0 01/16/2015   GLUCOSE 124* 01/16/2015   CHOL 250* 01/16/2015   TRIG 169.0* 01/16/2015   HDL 51.70 01/16/2015   LDLDIRECT 155.3 02/15/2012   LDLCALC 164* 01/16/2015   ALT 13 01/16/2015   AST 16 01/16/2015   NA 138 01/16/2015   K 4.1 01/16/2015   CL 100 01/16/2015   CREATININE 1.12 01/16/2015   BUN 16 01/16/2015   CO2 27 01/16/2015   TSH 1.79 01/16/2015   PSA 2.33 01/16/2015    Dg Chest 2 View  06/24/2015  CLINICAL DATA:  Cough, shortness of breath. EXAM: CHEST  2 VIEW COMPARISON:  May 08, 2013. FINDINGS: The heart size and mediastinal contours are within normal limits. Both lungs are clear. No pneumothorax or pleural effusion is noted. Atherosclerosis of descending thoracic aorta is noted. The visualized skeletal structures are unremarkable. IMPRESSION: No active cardiopulmonary disease. Electronically Signed   By: Marijo Conception, M.D.   On: 06/24/2015 08:17    Assessment & Plan:   Diagnoses and all orders for this visit:  Essential hypertension  COPD mixed type ([place])  B12 deficiency   I am having Mr. Piraino maintain his BABY ASPIRIN PO, traMADol, sildenafil, atenolol, losartan, naproxen, omeprazole, promethazine-codeine, umeclidinium-vilanterol, and doxycycline.  No orders of the defined types were placed in this encounter.     Follow-up: No Follow-up on file.  Walker Kehr, MD

## 2015-11-28 NOTE — Assessment & Plan Note (Signed)
On Atenolol 

## 2015-11-28 NOTE — Assessment & Plan Note (Signed)
D/c Cardura. Start Uroxatral

## 2015-11-28 NOTE — Patient Instructions (Signed)
The key is to stop smoking completely before smoking completely stops you!   Pulmonary follow up is as needed

## 2015-11-28 NOTE — Progress Notes (Signed)
Pre visit review using our clinic review tool, if applicable. No additional management support is needed unless otherwise documented below in the visit note. 

## 2015-11-28 NOTE — Assessment & Plan Note (Signed)
R shoulder  Tramadol prn  Potential benefits of a long term opioids use as well as potential risks (i.e. addiction risk, apnea etc) and complications (i.e. Somnolence, constipation and others) were explained to the patient and were aknowledged. No ETOH

## 2015-11-28 NOTE — Assessment & Plan Note (Addendum)
IM B12 Re-start SL B12

## 2015-11-28 NOTE — Addendum Note (Signed)
Addended by: Cresenciano Lick on: 11/28/2015 08:58 AM   Modules accepted: Orders

## 2015-11-30 NOTE — Assessment & Plan Note (Signed)
PFT's  07/18/2015  FEV1 2.25 (91 % ) ratio 73 with no % improvement from saba p nothing  prior to study with DLCO  71 % corrects to 84 % for alv volume - Spirometry 11/28/2015  FEV1 2.01 (81%)  Ratio 67     I reviewed the Fletcher curve with the patient that basically indicates  if you quit smoking when your best day FEV1 is still well preserved (as is clearly  the case here)  it is highly unlikely you will progress to severe disease and informed the patient there was no medication on the market that has proven to alter the curve/ its downward trajectory  or the likelihood of progression of their disease.  Therefore stopping smoking and maintaining abstinence is the most important aspect of care, not choice of inhalers or for that matter, doctors.    Condition is mild at this point but pt reports he can't activate the ignition lockout.  Pulmonary f/u is as needed   Total time devoted to counseling  = 35/57m review case with pt/ discussion of options/alternatives/ personally creating written instructions  in presence of pt  then going over those specific  Instructions directly with the pt including how to use all of the meds but in particular covering each new medication in detail and the difference between the maintenance/automatic meds and the prns using an action plan format for the latter.

## 2015-12-11 ENCOUNTER — Telehealth: Payer: Self-pay

## 2015-12-11 NOTE — Telephone Encounter (Signed)
[employer] received, completed, and forwarded to Dr Plotnikov's assistant for signature once he returns to office 12/15/2015

## 2015-12-15 NOTE — Telephone Encounter (Signed)
Pt is coming tomorrow to see Dr Tamala Julian, he is hoping to pick up this paperwork while he is here tomorrow .

## 2015-12-16 ENCOUNTER — Ambulatory Visit (INDEPENDENT_AMBULATORY_CARE_PROVIDER_SITE_OTHER): Payer: Medicare Other | Admitting: Family Medicine

## 2015-12-16 ENCOUNTER — Other Ambulatory Visit: Payer: Self-pay

## 2015-12-16 ENCOUNTER — Encounter: Payer: Self-pay | Admitting: Family Medicine

## 2015-12-16 VITALS — BP 134/82 | HR 72

## 2015-12-16 DIAGNOSIS — M66821 Spontaneous rupture of other tendons, right upper arm: Secondary | ICD-10-CM

## 2015-12-16 DIAGNOSIS — M79601 Pain in right arm: Secondary | ICD-10-CM

## 2015-12-16 DIAGNOSIS — IMO0002 Reserved for concepts with insufficient information to code with codable children: Secondary | ICD-10-CM | POA: Insufficient documentation

## 2015-12-16 DIAGNOSIS — M66321 Spontaneous rupture of flexor tendons, right upper arm: Secondary | ICD-10-CM

## 2015-12-16 NOTE — Assessment & Plan Note (Signed)
Patient does have a rupture of the bicep. No pain at this time. Mild impingement syndrome noted. Mild weakness as well. We discussed the possibility of possibly considering injection of the shoulder. Patient likely does have some underlying arthritic changes. Patient also has had difficulty with drinking the past which is Likely contributed to his problems. We discussed with patient about supplementations. We discussed compression given trial of topical anti-inflammatories. Patient and will come back and see me again more on an as-needed basis.

## 2015-12-16 NOTE — Patient Instructions (Signed)
Good to see you  Ice 20 minutes 2 times daily as needed pennsaid pinkie amount topically 2 times daily as needed.  Compression sleeve such as tommy copper at [facility] could help you when doing a lot of activity  Overall it will not get worse.  Sometime though the shoulder will get worse and may need an injection at some point Write me any questions See me when you need me.

## 2015-12-16 NOTE — Progress Notes (Signed)
[facility] [address] [place], [place] [address] Phone: [phone] Subjective:     CC: Right arm deformity  [id number] Austin Torres is a 82 y.o. male coming in with complaint of right arm deformity. Patient states he is having pain on the anterior aspect of his right arm for months. Did not see any provider. Patient went to pick something up that was approximately 15 pounds and had an audible pop. Patient states that he had severe pain immediately and had pain for approximately 2 days. Patient noticed abnormality of the muscle immediately. Patient states since then pain is resolved but continues to have the abnormality. States that there is more of a pain muscle it seems to be in the middle of the arm. Patient states that he can do most activities fine until he has a arm above his head and he starts to bring it down and then has some mild discomfort. States that he can sleep comfortably at night. Just once enough anything can be done. We have seen patient previously and did have a quadriceps rupture.     Past Medical History  Diagnosis Date  . Hyperlipidemia   . Hypertension   . Hypogonadism male   . BPH (benign prostatic hypertrophy)   . B12 deficiency   . Skin cancer   . GERD (gastroesophageal reflux disease)    Past Surgical History  Procedure Laterality Date  . Appendectomy    . Tonsillectomy    . Eye surgery      glaucoma procedure -both eyes  . Skin cancer excision      scalp   Social History   Social History  . Marital Status: Married    Spouse Name: N/A  . Number of Children: 3  . Years of Education: 16   Occupational History  .     Social History Main Topics  . Smoking status: Current Every Day Smoker -- 1.00 packs/day for 25 years    Types: Cigars  . Smokeless tobacco: Never Used  . Alcohol Use: 9.0 oz/week    15 Standard drinks or equivalent per week  . Drug Use: No  . Sexual Activity:    Partners: Female    Other Topics Concern  . None   Social History Narrative   HSG, [employer]. Married '61 2 dtrs - '68, 69; 2 sons - '72, '78. 4 grandchildren.   Work - Magazine features editor- Engineer, maintenance). Marriage is stable. He is retired. ACP - No CPR, No mechanical ventilation for more than short-term. No futile or heroic measures.    Allergies  Allergen Reactions  . Cardura [Doxazosin Mesylate]     dizzy   Family History  Problem Relation Age of Onset  . Heart disease Father   . Heart disease Brother     CAD/MI  . Cancer Neg Hx     no colon cancer, no prostate cancer  . Colon cancer Neg Hx   . Esophageal cancer Neg Hx   . Rectal cancer Neg Hx   . Stomach cancer Neg Hx     Past medical history, social, surgical and family history all reviewed in electronic medical record.  No pertanent information unless stated regarding to the chief complaint.   Review of Systems: No headache, visual changes, nausea, vomiting, diarrhea, constipation, dizziness, abdominal pain, skin rash, fevers, chills, night sweats, weight loss, swollen lymph nodes, body aches, joint swelling, muscle aches, chest pain, shortness of breath,  mood changes.   Objective Blood pressure 134/82, pulse 72.  General: No apparent distress alert and oriented x3 mood and affect normal, dressed appropriately.  HEENT: Pupils equal, extraocular movements intact  Respiratory: Patient's speak in full sentences and does not appear short of breath  Cardiovascular: No lower extremity edema, non tender, no erythema  Skin: Warm dry intact with no signs of infection or rash on extremities or on axial skeleton.  Abdomen: Soft nontender  Neuro: Cranial nerves II through XII are intact, neurovascularly intact in all extremities with 2+ DTRs and 2+ pulses.  Lymph: No lymphadenopathy of posterior or anterior cervical chain or axillae bilaterally.  Gait normal with good balance and coordination.  MSK:  Non tender with full  range of motion and good stability and symmetric strength and tone of  elbows, wrist, hip, knee and ankles bilaterally.  Shoulder: Right Patient does have a Popeye sign patient by some more distally located. Unable to palpate patient's bicep tendon proximally ROM is full in all planes. Rotator cuff strength 4 out of 5 compared to the contralateral side I'll impingement noted Speeds and Yergason's tests normal. No labral pathology noted with negative Obrien's, negative clunk and good stability. Normal scapular function observed. No painful arc and no drop arm sign. No apprehension sign Contralateral shoulder unremarkable  MSK US performed of: Right shoulder This study was ordered, performed, and interpreted by Charlann Boxer D.O.  Shoulder:    Biceps Tendon:  Patient does have a full-thickness tear with patient's proximal tendon located only 4 cm proximal to the elbow. No tear in the muscle itself. Brachial radialis intact. Impression: Bicep tendon rupture proximally       Impression and Recommendations:     This case required medical decision making of moderate complexity.      Note: This dictation was prepared with Dragon dictation along with smaller phrase technology. Any transcriptional errors that result from this process are unintentional.

## 2015-12-23 NOTE — Telephone Encounter (Signed)
Forms completed by PCP and faxed to [facility] attn [place] @ [phone]. Sent to be scanned into EMR I tried to cal pt- no answer/unable to leave vm.

## 2016-03-11 DIAGNOSIS — R361 Hematospermia: Secondary | ICD-10-CM | POA: Diagnosis not present

## 2016-03-11 DIAGNOSIS — R310 Gross hematuria: Secondary | ICD-10-CM | POA: Diagnosis not present

## 2016-03-31 ENCOUNTER — Telehealth: Payer: Self-pay | Admitting: *Deleted

## 2016-03-31 NOTE — Telephone Encounter (Signed)
Rec'd fax pt requesting refill on Diphenoxyla/Atrophine 2.5-0.25 mg for loos stools. Last filled 10/24/14...Austin Torres

## 2016-03-31 NOTE — Telephone Encounter (Signed)
OK to fill this prescription with additional refills x1 Thank you!  

## 2016-04-01 ENCOUNTER — Encounter: Payer: Self-pay | Admitting: Internal Medicine

## 2016-04-01 ENCOUNTER — Other Ambulatory Visit (INDEPENDENT_AMBULATORY_CARE_PROVIDER_SITE_OTHER): Payer: Medicare Other

## 2016-04-01 ENCOUNTER — Ambulatory Visit (INDEPENDENT_AMBULATORY_CARE_PROVIDER_SITE_OTHER): Payer: Medicare Other | Admitting: Internal Medicine

## 2016-04-01 VITALS — BP 140/70 | HR 58 | Temp 98.3°F | Wt 178.0 lb

## 2016-04-01 DIAGNOSIS — Z23 Encounter for immunization: Secondary | ICD-10-CM

## 2016-04-01 DIAGNOSIS — J449 Chronic obstructive pulmonary disease, unspecified: Secondary | ICD-10-CM

## 2016-04-01 DIAGNOSIS — R350 Frequency of micturition: Secondary | ICD-10-CM

## 2016-04-01 DIAGNOSIS — I10 Essential (primary) hypertension: Secondary | ICD-10-CM

## 2016-04-01 DIAGNOSIS — R319 Hematuria, unspecified: Secondary | ICD-10-CM | POA: Insufficient documentation

## 2016-04-01 DIAGNOSIS — R310 Gross hematuria: Secondary | ICD-10-CM | POA: Diagnosis not present

## 2016-04-01 DIAGNOSIS — E785 Hyperlipidemia, unspecified: Secondary | ICD-10-CM | POA: Diagnosis not present

## 2016-04-01 DIAGNOSIS — N401 Enlarged prostate with lower urinary tract symptoms: Secondary | ICD-10-CM

## 2016-04-01 DIAGNOSIS — E538 Deficiency of other specified B group vitamins: Secondary | ICD-10-CM

## 2016-04-01 DIAGNOSIS — N320 Bladder-neck obstruction: Secondary | ICD-10-CM | POA: Diagnosis not present

## 2016-04-01 LAB — CBC WITH DIFFERENTIAL/PLATELET
BASOS PCT: 0.4 % (ref 0.0–3.0)
Basophils Absolute: 0 10*3/uL (ref 0.0–0.1)
EOS ABS: 0.4 10*3/uL (ref 0.0–0.7)
EOS PCT: 4.4 % (ref 0.0–5.0)
HCT: 41.1 % (ref 39.0–52.0)
HEMOGLOBIN: 14.1 g/dL (ref 13.0–17.0)
LYMPHS ABS: 2.2 10*3/uL (ref 0.7–4.0)
Lymphocytes Relative: 23.7 % (ref 12.0–46.0)
MCHC: 34.2 g/dL (ref 30.0–36.0)
MCV: 89.4 fl (ref 78.0–100.0)
MONO ABS: 0.5 10*3/uL (ref 0.1–1.0)
Monocytes Relative: 5.7 % (ref 3.0–12.0)
NEUTROS ABS: 6 10*3/uL (ref 1.4–7.7)
Neutrophils Relative %: 65.8 % (ref 43.0–77.0)
PLATELETS: 182 10*3/uL (ref 150.0–400.0)
RBC: 4.6 Mil/uL (ref 4.22–5.81)
RDW: 12.8 % (ref 11.5–15.5)
WBC: 9.1 10*3/uL (ref 4.0–10.5)

## 2016-04-01 LAB — HEPATIC FUNCTION PANEL
ALT: 13 U/L (ref 0–53)
AST: 15 U/L (ref 0–37)
Albumin: 4.2 g/dL (ref 3.5–5.2)
Alkaline Phosphatase: 50 U/L (ref 39–117)
BILIRUBIN TOTAL: 0.8 mg/dL (ref 0.2–1.2)
Bilirubin, Direct: 0.1 mg/dL (ref 0.0–0.3)
Total Protein: 6.6 g/dL (ref 6.0–8.3)

## 2016-04-01 LAB — BASIC METABOLIC PANEL
BUN: 16 mg/dL (ref 6–23)
CHLORIDE: 102 meq/L (ref 96–112)
CO2: 26 mEq/L (ref 19–32)
Calcium: 9.6 mg/dL (ref 8.4–10.5)
Creatinine, Ser: 1.11 mg/dL (ref 0.40–1.50)
GFR: 67.72 mL/min (ref >60.00)
Glucose, Bld: 95 mg/dL (ref 70–99)
POTASSIUM: 4.1 meq/L (ref 3.5–5.1)
SODIUM: 139 meq/L (ref 135–145)

## 2016-04-01 LAB — URINALYSIS, ROUTINE W REFLEX MICROSCOPIC
Bilirubin Urine: NEGATIVE
KETONES UR: NEGATIVE
LEUKOCYTES UA: NEGATIVE
NITRITE: NEGATIVE
Specific Gravity, Urine: <=1.005 — AB (ref 1.000–1.030)
TOTAL PROTEIN, URINE-UPE24: NEGATIVE
URINE GLUCOSE: NEGATIVE
UROBILINOGEN UA: 0.2 (ref 0.0–1.0)
pH: 5.5 (ref 5.0–8.0)

## 2016-04-01 MED ORDER — DIPHENOXYLATE-ATROPINE 2.5-0.025 MG PO TABS: 1.0000 | ORAL_TABLET | Freq: Four times a day (QID) | ORAL | 0 refills | Status: DC | PRN

## 2016-04-01 MED ORDER — DIPHENOXYLATE-ATROPINE 2.5-0.025 MG PO TABS: 1.0000 | ORAL_TABLET | Freq: Four times a day (QID) | ORAL | 1 refills | Status: DC | PRN

## 2016-04-01 NOTE — Assessment & Plan Note (Signed)
Re-start B12 

## 2016-04-01 NOTE — Assessment & Plan Note (Signed)
Subjectively worse Pulm ref Dr Melvyn Novas

## 2016-04-01 NOTE — Assessment & Plan Note (Addendum)
X rays pending - Dr Jeffie Pollock [facility] Limit your beer use

## 2016-04-01 NOTE — Addendum Note (Signed)
Addended by: Cresenciano Lick on: 04/01/2016 11:50 AM   Modules accepted: Orders

## 2016-04-01 NOTE — Assessment & Plan Note (Signed)
On Atenolol 

## 2016-04-01 NOTE — Assessment & Plan Note (Signed)
Dr Jeffie Pollock Chronic D/c Cardura. Uroxatral

## 2016-04-01 NOTE — Progress Notes (Signed)
Subjective:  Patient ID: Austin Torres, male    DOB: 09-12-1933  Age: 82 y.o. MRN: [id number]  CC: Hematuria (x 1 month)   HPI Austin Torres presents for blood in the urine off and on. He saw Dr Ralene Muskrat PA 1 mo ago. CT was ordered. A lot of beer at once (4-5) seems to trigger it. C/o COPD and breathing difficulty F/u HTN  Outpatient Medications Prior to Visit  Medication Sig Dispense Refill  . alfuzosin (UROXATRAL) 10 MG 24 hr tablet Take 1 tablet (10 mg total) by mouth daily with breakfast. 90 tablet 3  . atenolol (TENORMIN) 50 MG tablet Take 1 tablet (50 mg total) by mouth 2 (two) times daily. 180 tablet 3  . BABY ASPIRIN PO Take 81 mg by mouth.     . Cyanocobalamin (VITAMIN B-12) 1000 MCG SUBL Place 1 tablet (1,000 mcg total) under the tongue daily. 100 tablet 3  . diphenoxylate-atropine (LOMOTIL) 2.5-0.025 MG tablet Take 1 tablet by mouth 4 (four) times daily as needed for diarrhea or loose stools. 60 tablet 1  . losartan (COZAAR) 100 MG tablet Take 1 tablet (100 mg total) by mouth daily. 90 tablet 3  . naproxen (NAPROSYN) 500 MG tablet Take 1 tablet (500 mg total) by mouth daily as needed for moderate pain. 30 tablet 3  . omeprazole (PRILOSEC) 20 MG capsule TAKE 2 CAPSULES BY MOUTH ONCE DAILY 60 capsule 11  . sildenafil (REVATIO) 20 MG tablet TAKE 3 TABLETS BY MOUTH DAILY AS NEEDED 30 tablet 0  . traMADol (ULTRAM) 50 MG tablet Take 0.5-1 tablets (25-50 mg total) by mouth every 12 (twelve) hours as needed for severe pain. 60 tablet 0  . Umeclidinium-Vilanterol (ANORO ELLIPTA) 62.5-25 MCG/INH AEPB Inhale 1 puff into the lungs daily. 1 each 11   No facility-administered medications prior to visit.     ROS Review of Systems  Constitutional: Negative for appetite change, fatigue and unexpected weight change.  HENT: Negative for congestion, nosebleeds, sneezing, sore throat and trouble swallowing.   Eyes: Negative for itching and visual disturbance.  Respiratory: Positive for  cough and shortness of breath.   Cardiovascular: Negative for chest pain, palpitations and leg swelling.  Gastrointestinal: Negative for abdominal distention, blood in stool, diarrhea and nausea.  Genitourinary: Positive for hematuria. Negative for frequency.  Musculoskeletal: Negative for back pain, gait problem, joint swelling and neck pain.  Skin: Negative for rash.  Neurological: Negative for dizziness, tremors, speech difficulty and weakness.  Psychiatric/Behavioral: Negative for agitation, dysphoric mood and sleep disturbance. The patient is not nervous/anxious.     Objective:  BP 140/70   Pulse (!) 58   Temp 98.3 F (36.8 C) (Oral)   Wt 178 lb (80.7 kg)   SpO2 98%   BMI 28.30 kg/m   BP Readings from Last 3 Encounters:  04/01/16 140/70  12/16/15 134/82  11/28/15 132/64    Wt Readings from Last 3 Encounters:  04/01/16 178 lb (80.7 kg)  11/28/15 177 lb 12.8 oz (80.6 kg)  11/28/15 177 lb (80.3 kg)    Physical Exam  Constitutional: He is oriented to person, place, and time. He appears well-developed. No distress.  NAD  HENT:  Mouth/Throat: Oropharynx is clear and moist.  Eyes: Conjunctivae are normal. Pupils are equal, round, and reactive to light.  Neck: Normal range of motion. No JVD present. No thyromegaly present.  Cardiovascular: Normal rate, regular rhythm, normal heart sounds and intact distal pulses.  Exam reveals no gallop and no  friction rub.   No murmur heard. Pulmonary/Chest: Effort normal and breath sounds normal. No respiratory distress. He has no wheezes. He has no rales. He exhibits no tenderness.  Abdominal: Soft. Bowel sounds are normal. He exhibits no distension and no mass. There is no tenderness. There is no rebound and no guarding.  Musculoskeletal: Normal range of motion. He exhibits no edema or tenderness.  Lymphadenopathy:    He has no cervical adenopathy.  Neurological: He is alert and oriented to person, place, and time. He has normal  reflexes. No cranial nerve deficit. He exhibits normal muscle tone. He displays a negative Romberg sign. Coordination and gait normal.  Skin: Skin is warm and dry. No rash noted.  Psychiatric: He has a normal mood and affect. His behavior is normal. Judgment and thought content normal.    Lab Results  Component Value Date   WBC 8.3 01/16/2015   HGB 15.5 01/16/2015   HCT 45.8 01/16/2015   PLT 165.0 01/16/2015   GLUCOSE 124 (H) 01/16/2015   CHOL 250 (H) 01/16/2015   TRIG 169.0 (H) 01/16/2015   HDL 51.70 01/16/2015   LDLDIRECT 155.3 02/15/2012   LDLCALC 164 (H) 01/16/2015   ALT 13 01/16/2015   AST 16 01/16/2015   NA 138 01/16/2015   K 4.1 01/16/2015   CL 100 01/16/2015   CREATININE 1.12 01/16/2015   BUN 16 01/16/2015   CO2 27 01/16/2015   TSH 1.79 01/16/2015   PSA 2.33 01/16/2015    Dg Chest 2 View  Result Date: 06/24/2015 CLINICAL DATA:  Cough, shortness of breath. EXAM: CHEST  2 VIEW COMPARISON:  May 08, 2013. FINDINGS: The heart size and mediastinal contours are within normal limits. Both lungs are clear. No pneumothorax or pleural effusion is noted. Atherosclerosis of descending thoracic aorta is noted. The visualized skeletal structures are unremarkable. IMPRESSION: No active cardiopulmonary disease. Electronically Signed   By: Marijo Conception, M.D.   On: 06/24/2015 08:17    Assessment & Plan:   There are no diagnoses linked to this encounter. I am having Mr. Rettke maintain his BABY ASPIRIN PO, sildenafil, atenolol, losartan, naproxen, omeprazole, umeclidinium-vilanterol, alfuzosin, Vitamin B-12, traMADol, and diphenoxylate-atropine.  No orders of the defined types were placed in this encounter.    Follow-up: No Follow-up on file.  Walker Kehr, MD

## 2016-04-01 NOTE — Telephone Encounter (Signed)
[facility]ed [facility] spoke w/elizabeth gave MD approval.../lmb

## 2016-04-01 NOTE — Progress Notes (Signed)
Pre visit review using our clinic review tool, if applicable. No additional management support is needed unless otherwise documented below in the visit note. 

## 2016-04-02 ENCOUNTER — Encounter: Payer: Self-pay | Admitting: Internal Medicine

## 2016-04-02 ENCOUNTER — Ambulatory Visit (INDEPENDENT_AMBULATORY_CARE_PROVIDER_SITE_OTHER): Payer: Medicare Other | Admitting: Internal Medicine

## 2016-04-02 VITALS — BP 150/70 | HR 56 | Ht 67.0 in | Wt 175.0 lb

## 2016-04-02 DIAGNOSIS — F17210 Nicotine dependence, cigarettes, uncomplicated: Secondary | ICD-10-CM | POA: Diagnosis not present

## 2016-04-02 DIAGNOSIS — J449 Chronic obstructive pulmonary disease, unspecified: Secondary | ICD-10-CM

## 2016-04-02 NOTE — Progress Notes (Signed)
Subjective:    Patient ID: Austin Torres, male    DOB: 1933-10-23     MRN: [id number]    Brief patient profile:  79  yowm never regular smoker mostly cigar smoker with onset sob in the 1990's and much worse since 2012 but not on maint rx referred to pulmonary clinic 11/28/2015 by Dr  Alain Marion for copd eval.   11/28/2015 1st [facility] office visit/ [name]   Chief Complaint  Patient presents with  . Pulmonary Consult    Referred by Dr. Alain Marion. Pt states here to have spirometry done in order to get form signed for ingnition interlock waiver for the [facility].   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 Pulmonary Consult    Pt states has dx of COPD and unable to get his lisence back b/c he can not blow into to interlock system.     No better with anoro so d/c'd months ago  No obvious day to day or daytime variability or assoc excess/ purulent sputum or mucus plugs or hemoptysis or cp or chest tightness, subjective wheeze or overt sinus or hb symptoms. No unusual exp hx or h/o childhood pna/ asthma or knowledge of premature birth.  Sleeping ok without nocturnal  or early am exacerbation  of respiratory  c/o's or need for noct saba. Also denies any obvious fluctuation of symptoms with weather or environmental changes or other aggravating or alleviating factors except as outlined above   Current Medications, Allergies, Complete Past Medical History, Past Surgical History, Family History, and Social History were reviewed in [employer] record.  ROS  The following are not active complaints unless bolded sore throat, dysphagia, dental problems, itching, sneezing,  nasal congestion or excess/ purulent secretions, ear ache,    fever, chills, sweats, unintended wt loss, classically pleuritic or exertional cp,  orthopnea pnd or leg swelling, presyncope, palpitations, abdominal pain, anorexia, nausea, vomiting, diarrhea  or change in bowel or bladder habits, change in stools or urine, dysuria,hematuria,  rash, arthralgias, visual complaints, headache, numbness, weakness or ataxia or problems with walking or coordination,  change in mood/affect or memory.                  Objective:   Physical Exam  amb wm with hard [place] accent   04/03/2016     176   11/28/15 177 lb 12.8 oz (80.65 kg)  11/28/15 177 lb (80.287 kg)  10/06/15 176 lb (79.833 kg)    Vital signs reviewed - Note on arrival 02 sats  97% on RA    HEENT: nl dentition, turbinates, and oropharynx. Nl external ear canals without cough reflex   NECK :  without JVD/Nodes/TM/ nl carotid upstrokes bilaterally   LUNGS: no acc muscle use,  Nl contour chest with slightly distant bs bilaterally  CV:  RRR  no s3 or murmur or increase in P2, no edema   ABD:  Obese/ soft and nontender with mid  inspiratory hoover's in the supine position. No bruits or organomegaly, bowel sounds nl  MS:  Nl gait/ ext warm without deformities, calf tenderness, cyanosis or clubbing No obvious joint restrictions   SKIN: warm and  dry without lesions    NEURO:  alert, approp, nl sensorium with  no motor deficits      I personally reviewed images and agree with radiology impression as follows:  CXR:  06/24/15 No active cardiopulmonary disease.         Assessment & Plan:

## 2016-04-02 NOTE — Patient Instructions (Addendum)
You have mild copd that may interfere with your ability to use the ignition lock.  If you talk to the [facility] and find additional criteria that would prevent you from using the lock,  I would be happy to look again at your pfts to see whether you meet any of their criteria.

## 2016-04-03 ENCOUNTER — Encounter: Payer: Self-pay | Admitting: Internal Medicine

## 2016-04-03 DIAGNOSIS — F17290 Nicotine dependence, other tobacco product, uncomplicated: Secondary | ICD-10-CM | POA: Insufficient documentation

## 2016-04-03 NOTE — Assessment & Plan Note (Signed)
PFT's  07/18/2015  FEV1 2.25 (91 % ) ratio 73 with no % improvement from saba p nothing  prior to study with DLCO  71 % corrects to 84 % for alv volume - Spirometry 11/28/2015  FEV1 2.01 (81%)  Ratio 67   - Spirometry 04/02/2016  FEV1 2.13 (85%)  Ratio 68 off all rx    I had an extended final summary discussion with the patient reviewing all relevant studies completed to date and  lasting 15 to 20 minutes of a 25 minute visit on the following issues:    1) no indication for rx other than focus on smoking cessation as his copd is still very mild clinically and no tendency to aecopd or limit from desired activities other than use the ignition lock out to start his car.  2) cannot explain why he reports unable to use the ignition lock out but happy to look at any published material on the one he is being required to use   3) pulmonary f/u is prn

## 2016-04-03 NOTE — Assessment & Plan Note (Signed)

## 2016-04-05 ENCOUNTER — Other Ambulatory Visit: Payer: Self-pay | Admitting: Internal Medicine

## 2016-04-05 DIAGNOSIS — R3129 Other microscopic hematuria: Secondary | ICD-10-CM

## 2016-04-05 DIAGNOSIS — R361 Hematospermia: Secondary | ICD-10-CM | POA: Diagnosis not present

## 2016-04-05 DIAGNOSIS — N401 Enlarged prostate with lower urinary tract symptoms: Secondary | ICD-10-CM | POA: Diagnosis not present

## 2016-04-05 DIAGNOSIS — R310 Gross hematuria: Secondary | ICD-10-CM | POA: Diagnosis not present

## 2016-04-05 LAB — VITAMIN B12: VITAMIN B 12: 445 pg/mL (ref 211–911)

## 2016-04-05 LAB — PSA: PSA: 2.55 ng/mL (ref 0.10–4.00)

## 2016-04-05 LAB — TSH: TSH: 2.43 u[IU]/mL (ref 0.35–4.50)

## 2016-05-04 ENCOUNTER — Encounter: Payer: Medicare Other | Admitting: Internal Medicine

## 2016-05-26 ENCOUNTER — Ambulatory Visit (INDEPENDENT_AMBULATORY_CARE_PROVIDER_SITE_OTHER): Payer: Medicare Other | Admitting: Internal Medicine

## 2016-05-26 ENCOUNTER — Encounter: Payer: Self-pay | Admitting: Internal Medicine

## 2016-05-26 VITALS — BP 140/80 | HR 63 | Wt 179.0 lb

## 2016-05-26 DIAGNOSIS — Z0000 Encounter for general adult medical examination without abnormal findings: Secondary | ICD-10-CM | POA: Diagnosis not present

## 2016-05-26 DIAGNOSIS — E559 Vitamin D deficiency, unspecified: Secondary | ICD-10-CM

## 2016-05-26 DIAGNOSIS — J449 Chronic obstructive pulmonary disease, unspecified: Secondary | ICD-10-CM

## 2016-05-26 DIAGNOSIS — F17290 Nicotine dependence, other tobacco product, uncomplicated: Secondary | ICD-10-CM

## 2016-05-26 DIAGNOSIS — E538 Deficiency of other specified B group vitamins: Secondary | ICD-10-CM

## 2016-05-26 DIAGNOSIS — R911 Solitary pulmonary nodule: Secondary | ICD-10-CM

## 2016-05-26 DIAGNOSIS — I10 Essential (primary) hypertension: Secondary | ICD-10-CM

## 2016-05-26 MED ORDER — LOSARTAN POTASSIUM 100 MG PO TABS: 100.0000 mg | ORAL_TABLET | Freq: Every day | ORAL | 3 refills | Status: DC

## 2016-05-26 MED ORDER — ALFUZOSIN HCL ER 10 MG PO TB24: 10.0000 mg | ORAL_TABLET | Freq: Every day | ORAL | 3 refills | Status: DC

## 2016-05-26 MED ORDER — ATENOLOL 50 MG PO TABS: 50.0000 mg | ORAL_TABLET | Freq: Two times a day (BID) | ORAL | 3 refills | Status: DC

## 2016-05-26 MED ORDER — OMEPRAZOLE 20 MG PO CPDR: 40.0000 mg | DELAYED_RELEASE_CAPSULE | Freq: Every day | ORAL | 3 refills | Status: DC

## 2016-05-26 NOTE — Assessment & Plan Note (Signed)
On Atenolol 

## 2016-05-26 NOTE — Patient Instructions (Signed)

## 2016-05-26 NOTE — Assessment & Plan Note (Addendum)

## 2016-05-26 NOTE — Assessment & Plan Note (Signed)
F/u with Dr Melvyn Novas

## 2016-05-26 NOTE — Progress Notes (Signed)
Pre visit review using our clinic review tool, if applicable. No additional management support is needed unless otherwise documented below in the visit note. 

## 2016-05-26 NOTE — Assessment & Plan Note (Signed)
Re-start Vit D 

## 2016-05-26 NOTE — Assessment & Plan Note (Signed)
He is quitting

## 2016-05-26 NOTE — Progress Notes (Signed)
Subjective:  Patient ID: Austin Torres, male    DOB: April 29, 1934  Age: 82 y.o. MRN: [id number]  CC: No chief complaint on file.   HPI Austin Torres presents for HTN, B12 def, COPD f/u  Outpatient Medications Prior to Visit  Medication Sig Dispense Refill  . atenolol (TENORMIN) 50 MG tablet Take 1 tablet (50 mg total) by mouth 2 (two) times daily. 180 tablet 3  . BABY ASPIRIN PO Take 81 mg by mouth.     . Cyanocobalamin (VITAMIN B-12) 1000 MCG SUBL Place 1 tablet (1,000 mcg total) under the tongue daily. 100 tablet 3  . diphenoxylate-atropine (LOMOTIL) 2.5-0.025 MG tablet Take 1 tablet by mouth 4 (four) times daily as needed for diarrhea or loose stools. 60 tablet 0  . losartan (COZAAR) 100 MG tablet Take 1 tablet (100 mg total) by mouth daily. 90 tablet 3  . omeprazole (PRILOSEC) 20 MG capsule TAKE 2 CAPSULES BY MOUTH ONCE DAILY 60 capsule 11  . sildenafil (REVATIO) 20 MG tablet TAKE 3 TABLETS BY MOUTH DAILY AS NEEDED 30 tablet 0  . traMADol (ULTRAM) 50 MG tablet Take 0.5-1 tablets (25-50 mg total) by mouth every 12 (twelve) hours as needed for severe pain. 60 tablet 0  . alfuzosin (UROXATRAL) 10 MG 24 hr tablet Take 1 tablet (10 mg total) by mouth daily with breakfast. (Patient not taking: Reported on 05/26/2016) 90 tablet 3  . naproxen (NAPROSYN) 500 MG tablet Take 1 tablet (500 mg total) by mouth daily as needed for moderate pain. (Patient not taking: Reported on 05/26/2016) 30 tablet 3   No facility-administered medications prior to visit.     ROS Review of Systems  Constitutional: Negative for appetite change, fatigue and unexpected weight change.  HENT: Negative for congestion, nosebleeds, sneezing, sore throat and trouble swallowing.   Eyes: Negative for itching and visual disturbance.  Respiratory: Positive for cough and shortness of breath.   Cardiovascular: Negative for chest pain, palpitations and leg swelling.  Gastrointestinal: Negative for abdominal distention,  blood in stool, diarrhea and nausea.  Genitourinary: Negative for frequency and hematuria.  Musculoskeletal: Negative for back pain, gait problem, joint swelling and neck pain.  Skin: Negative for rash.  Neurological: Negative for dizziness, tremors, speech difficulty and weakness.  Psychiatric/Behavioral: Negative for agitation, dysphoric mood and sleep disturbance. The patient is nervous/anxious.     Objective:  BP 140/80   Pulse 63   Wt 179 lb (81.2 kg)   SpO2 97%   BMI 28.04 kg/m   BP Readings from Last 3 Encounters:  05/26/16 140/80  04/02/16 (!) 150/70  04/01/16 140/70    Wt Readings from Last 3 Encounters:  05/26/16 179 lb (81.2 kg)  04/02/16 175 lb (79.4 kg)  04/01/16 178 lb (80.7 kg)    Physical Exam  Constitutional: He is oriented to person, place, and time. He appears well-developed. No distress.  NAD  HENT:  Mouth/Throat: Oropharynx is clear and moist.  Eyes: Conjunctivae are normal. Pupils are equal, round, and reactive to light.  Neck: Normal range of motion. No JVD present. No thyromegaly present.  Cardiovascular: Normal rate, regular rhythm, normal heart sounds and intact distal pulses.  Exam reveals no gallop and no friction rub.   No murmur heard. Pulmonary/Chest: Effort normal and breath sounds normal. No respiratory distress. He has no wheezes. He has no rales. He exhibits no tenderness.  Abdominal: Soft. Bowel sounds are normal. He exhibits no distension and no mass. There is no tenderness. There  is no rebound and no guarding.  Musculoskeletal: Normal range of motion. He exhibits no edema or tenderness.  Lymphadenopathy:    He has no cervical adenopathy.  Neurological: He is alert and oriented to person, place, and time. He has normal reflexes. No cranial nerve deficit. He exhibits normal muscle tone. He displays a negative Romberg sign. Coordination and gait normal.  Skin: Skin is warm and dry. No rash noted.  Psychiatric: He has a normal mood and  affect. His behavior is normal. Judgment and thought content normal.    Lab Results  Component Value Date   WBC 9.1 04/01/2016   HGB 14.1 04/01/2016   HCT 41.1 04/01/2016   PLT 182.0 04/01/2016   GLUCOSE 95 04/01/2016   CHOL 250 (H) 01/16/2015   TRIG 169.0 (H) 01/16/2015   HDL 51.70 01/16/2015   LDLDIRECT 155.3 02/15/2012   LDLCALC 164 (H) 01/16/2015   ALT 13 04/01/2016   AST 15 04/01/2016   NA 139 04/01/2016   K 4.1 04/01/2016   CL 102 04/01/2016   CREATININE 1.11 04/01/2016   BUN 16 04/01/2016   CO2 26 04/01/2016   TSH 2.43 04/01/2016   PSA 2.55 04/01/2016    Dg Chest 2 View  Result Date: 06/24/2015 CLINICAL DATA:  Cough, shortness of breath. EXAM: CHEST  2 VIEW COMPARISON:  May 08, 2013. FINDINGS: The heart size and mediastinal contours are within normal limits. Both lungs are clear. No pneumothorax or pleural effusion is noted. Atherosclerosis of descending thoracic aorta is noted. The visualized skeletal structures are unremarkable. IMPRESSION: No active cardiopulmonary disease. Electronically Signed   By: Marijo Conception, M.D.   On: 06/24/2015 08:17    Assessment & Plan:   There are no diagnoses linked to this encounter. I am having Mr. Malizia maintain his BABY ASPIRIN PO, sildenafil, atenolol, losartan, naproxen, omeprazole, alfuzosin, Vitamin B-12, traMADol, diphenoxylate-atropine, and doxazosin.  Meds ordered this encounter  Medications  . doxazosin (CARDURA) 4 MG tablet    Sig: Take 4 mg by mouth daily.     Follow-up: No Follow-up on file.  Walker Kehr, MD

## 2016-05-26 NOTE — Assessment & Plan Note (Signed)
9/17 CT 34mm RLL - needs CT in 6-12 mo

## 2016-05-26 NOTE — Assessment & Plan Note (Signed)
On B12 

## 2016-05-28 ENCOUNTER — Other Ambulatory Visit: Payer: Self-pay | Admitting: Internal Medicine

## 2016-06-01 NOTE — Telephone Encounter (Signed)
[facility] spoke w/Wendy gave md approval.../lmb

## 2016-06-29 ENCOUNTER — Inpatient Hospital Stay (HOSPITAL_COMMUNITY)
Admission: EM | Admit: 2016-06-29 | Discharge: 2016-07-01 | DRG: 065 | Disposition: A | Discharge status: Home / Self Care | Payer: Medicare Other | Attending: Internal Medicine | Admitting: Internal Medicine

## 2016-06-29 ENCOUNTER — Emergency Department (HOSPITAL_COMMUNITY): Payer: Medicare Other

## 2016-06-29 ENCOUNTER — Encounter (HOSPITAL_COMMUNITY): Payer: Self-pay | Admitting: Emergency Medicine

## 2016-06-29 DIAGNOSIS — I639 Cerebral infarction, unspecified: Secondary | ICD-10-CM | POA: Diagnosis not present

## 2016-06-29 DIAGNOSIS — I10 Essential (primary) hypertension: Secondary | ICD-10-CM | POA: Diagnosis not present

## 2016-06-29 DIAGNOSIS — Z8249 Family history of ischemic heart disease and other diseases of the circulatory system: Secondary | ICD-10-CM | POA: Diagnosis not present

## 2016-06-29 DIAGNOSIS — I358 Other nonrheumatic aortic valve disorders: Secondary | ICD-10-CM | POA: Diagnosis not present

## 2016-06-29 DIAGNOSIS — J449 Chronic obstructive pulmonary disease, unspecified: Secondary | ICD-10-CM | POA: Diagnosis not present

## 2016-06-29 DIAGNOSIS — Z888 Allergy status to other drugs, medicaments and biological substances status: Secondary | ICD-10-CM | POA: Diagnosis not present

## 2016-06-29 DIAGNOSIS — Z85828 Personal history of other malignant neoplasm of skin: Secondary | ICD-10-CM | POA: Diagnosis not present

## 2016-06-29 DIAGNOSIS — R001 Bradycardia, unspecified: Secondary | ICD-10-CM | POA: Diagnosis present

## 2016-06-29 DIAGNOSIS — G8191 Hemiplegia, unspecified affecting right dominant side: Secondary | ICD-10-CM | POA: Diagnosis not present

## 2016-06-29 DIAGNOSIS — R200 Anesthesia of skin: Secondary | ICD-10-CM | POA: Diagnosis not present

## 2016-06-29 DIAGNOSIS — Z8673 Personal history of transient ischemic attack (TIA), and cerebral infarction without residual deficits: Secondary | ICD-10-CM

## 2016-06-29 DIAGNOSIS — E785 Hyperlipidemia, unspecified: Secondary | ICD-10-CM | POA: Diagnosis not present

## 2016-06-29 DIAGNOSIS — F17290 Nicotine dependence, other tobacco product, uncomplicated: Secondary | ICD-10-CM | POA: Diagnosis present

## 2016-06-29 DIAGNOSIS — E538 Deficiency of other specified B group vitamins: Secondary | ICD-10-CM | POA: Diagnosis not present

## 2016-06-29 DIAGNOSIS — N400 Enlarged prostate without lower urinary tract symptoms: Secondary | ICD-10-CM | POA: Diagnosis not present

## 2016-06-29 DIAGNOSIS — R531 Weakness: Secondary | ICD-10-CM | POA: Diagnosis not present

## 2016-06-29 DIAGNOSIS — Z7982 Long term (current) use of aspirin: Secondary | ICD-10-CM | POA: Diagnosis not present

## 2016-06-29 DIAGNOSIS — K219 Gastro-esophageal reflux disease without esophagitis: Secondary | ICD-10-CM | POA: Diagnosis present

## 2016-06-29 DIAGNOSIS — R319 Hematuria, unspecified: Secondary | ICD-10-CM | POA: Diagnosis not present

## 2016-06-29 DIAGNOSIS — R739 Hyperglycemia, unspecified: Secondary | ICD-10-CM

## 2016-06-29 LAB — CBC
HEMATOCRIT: 39.9 % (ref 39.0–52.0)
Hemoglobin: 13.6 g/dL (ref 13.0–17.0)
MCH: 31 pg (ref 26.0–34.0)
MCHC: 34.1 g/dL (ref 30.0–36.0)
MCV: 90.9 fL (ref 78.0–100.0)
Platelets: 169 10*3/uL (ref 150–400)
RBC: 4.39 MIL/uL (ref 4.22–5.81)
RDW: 13.2 % (ref 11.5–15.5)
WBC: 8.3 10*3/uL (ref 4.0–10.5)

## 2016-06-29 LAB — I-STAT TROPONIN, ED: TROPONIN I, POC: 0 ng/mL (ref 0.00–0.08)

## 2016-06-29 LAB — COMPREHENSIVE METABOLIC PANEL
ALK PHOS: 51 U/L (ref 38–126)
ALT: 17 U/L (ref 17–63)
ANION GAP: 11 (ref 5–15)
AST: 21 U/L (ref 15–41)
Albumin: 4 g/dL (ref 3.5–5.0)
BILIRUBIN TOTAL: 0.6 mg/dL (ref 0.3–1.2)
BUN: 14 mg/dL (ref 6–20)
CALCIUM: 9.1 mg/dL (ref 8.9–10.3)
CO2: 23 mmol/L (ref 22–32)
CREATININE: 1.14 mg/dL (ref 0.61–1.24)
Chloride: 103 mmol/L (ref 101–111)
GFR, EST NON AFRICAN AMERICAN: 59 mL/min — AB (ref >60)
Glucose, Bld: 119 mg/dL — ABNORMAL HIGH (ref 65–99)
Potassium: 3.7 mmol/L (ref 3.5–5.1)
Sodium: 137 mmol/L (ref 135–145)
TOTAL PROTEIN: 6.7 g/dL (ref 6.5–8.1)

## 2016-06-29 LAB — I-STAT CHEM 8, ED
BUN: 17 mg/dL (ref 6–20)
CALCIUM ION: 1.1 mmol/L — AB (ref 1.15–1.40)
CHLORIDE: 103 mmol/L (ref 101–111)
Creatinine, Ser: 1.3 mg/dL — ABNORMAL HIGH (ref 0.61–1.24)
GLUCOSE: 120 mg/dL — AB (ref 65–99)
HCT: 38 % — ABNORMAL LOW (ref 39.0–52.0)
HEMOGLOBIN: 12.9 g/dL — AB (ref 13.0–17.0)
Potassium: 3.7 mmol/L (ref 3.5–5.1)
Sodium: 139 mmol/L (ref 135–145)
TCO2: 25 mmol/L (ref 0–100)

## 2016-06-29 LAB — URINALYSIS, ROUTINE W REFLEX MICROSCOPIC
Bilirubin Urine: NEGATIVE
GLUCOSE, UA: NEGATIVE mg/dL
Ketones, ur: NEGATIVE mg/dL
Leukocytes, UA: NEGATIVE
Nitrite: NEGATIVE
PROTEIN: NEGATIVE mg/dL
SPECIFIC GRAVITY, URINE: 1.009 (ref 1.005–1.030)
pH: 5 (ref 5.0–8.0)

## 2016-06-29 LAB — DIFFERENTIAL
Basophils Absolute: 0 10*3/uL (ref 0.0–0.1)
Basophils Relative: 0 %
EOS PCT: 3 %
Eosinophils Absolute: 0.3 10*3/uL (ref 0.0–0.7)
LYMPHS PCT: 31 %
Lymphs Abs: 2.5 10*3/uL (ref 0.7–4.0)
MONO ABS: 0.7 10*3/uL (ref 0.1–1.0)
MONOS PCT: 9 %
NEUTROS ABS: 4.7 10*3/uL (ref 1.7–7.7)
Neutrophils Relative %: 57 %

## 2016-06-29 LAB — APTT: aPTT: 29 seconds (ref 24–36)

## 2016-06-29 LAB — PROTIME-INR
INR: 1.04
PROTHROMBIN TIME: 13.6 s (ref 11.4–15.2)

## 2016-06-29 MED ORDER — ASPIRIN 325 MG PO TABS: 325.0000 mg | ORAL_TABLET | Freq: Once | ORAL | Status: DC

## 2016-06-29 NOTE — ED Notes (Signed)
Patient transported to MRI 

## 2016-06-29 NOTE — ED Provider Notes (Signed)
[facility] DEPT Provider Note   CSN: [id number] Arrival date & time: 06/29/16  [place]     History   Chief Complaint Chief Complaint  Patient presents with  . Weakness    Patient said he was having right sided weakness that started this morning about 0900hrs but is has resolved.  He said he was able to walk but his right arm was numb and weak.  The patient denies any pain, dizziness, or any other symptoms. He is having hematuria and was going to call the urologist because he has had the blood in the urine for two days.      HPI DEVENDER CHILLIS is a 83 y.o. male hx of GERD, HL, HTN, Here presenting with right arm numbness. States that around noontime, patient noticed acute onset of right arm numbness and weakness. States that he felt like his hand was going to sleep. He went to see his pharmacist who recommend that he comes to the ER for evaluation. However he waited several hours before coming in. States that he is still feeling some numbness on the right hand. Denies trouble speaking or swallowing or walking. Of note, patient has been having hematuria for several weeks but denies any dysuria or flank pain. Has hx of BPH and has seen urology in the past.   The history is provided by the patient.    Past Medical History:  Diagnosis Date  . B12 deficiency   . BPH (benign prostatic hypertrophy)   . GERD (gastroesophageal reflux disease)   . Hyperlipidemia   . Hypertension   . Hypogonadism male   . Skin cancer     Patient Active Problem List   Diagnosis Date Noted  . Solitary pulmonary nodule 05/26/2016  . Cigar smoker motivated to quit 04/03/2016  . Hematuria 04/01/2016  . Nontraumatic rupture of bicep tendon 12/16/2015  . Arthropathy of shoulder region 11/28/2015  . COPD GOLD I  07/30/2015  . CAP (community acquired pneumonia) 06/23/2015  . Bronchial spasm 06/23/2015  . Rupture of quadriceps muscle 04/15/2015  . Quadriceps tendon rupture 04/04/2015  . Vitamin D deficiency  01/18/2015  . Diarrhea 10/22/2013  . Low back pain 11/27/2011  . Well adult exam 01/10/2011  . TMJ tenderness 09/12/2010  . B12 deficiency 11/24/2009  . Hypogonadism male 11/20/2009  . PERIPHERAL NEUROPATHY 11/20/2009  . HYPERLIPIDEMIA 12/26/2006  . Essential hypertension 12/26/2006  . BPH (benign prostatic hyperplasia) 12/26/2006    Past Surgical History:  Procedure Laterality Date  . APPENDECTOMY    . EYE SURGERY     glaucoma procedure -both eyes  . skin cancer excision     scalp  . TONSILLECTOMY         Home Medications    Prior to Admission medications   Medication Sig Start Date End Date Taking? Authorizing Provider  alfuzosin (UROXATRAL) 10 MG 24 hr tablet Take 1 tablet (10 mg total) by mouth daily with breakfast. 05/26/16  Yes Aleksei Plotnikov V, MD  atenolol (TENORMIN) 50 MG tablet Take 1 tablet (50 mg total) by mouth 2 (two) times daily. 05/26/16  Yes Aleksei Plotnikov V, MD  BABY ASPIRIN PO Take 81 mg by mouth daily.    Yes Historical Provider, MD  Cyanocobalamin (VITAMIN B-12) 1000 MCG SUBL Place 1 tablet (1,000 mcg total) under the tongue daily. 11/28/15  Yes Aleksei Plotnikov V, MD  diphenoxylate-atropine (LOMOTIL) 2.5-0.025 MG tablet Take 1 tablet by mouth 4 (four) times daily as needed for diarrhea or loose stools. 04/01/16  Yes Aleksei Plotnikov V, MD  losartan (COZAAR) 100 MG tablet Take 1 tablet (100 mg total) by mouth daily. 05/26/16  Yes Aleksei Plotnikov V, MD  omeprazole (PRILOSEC) 20 MG capsule Take 2 capsules (40 mg total) by mouth daily. Patient taking differently: Take 20 mg by mouth daily.  05/26/16  Yes Aleksei Plotnikov V, MD  traMADol (ULTRAM) 50 MG tablet TAKE 1/2-1 TABLET BY MOUTH EVERY 12 HOURS AS NEEDED FOR SEVERE PAIN 05/28/16  Yes Cassandria Anger, MD    Family History Family History  Problem Relation Age of Onset  . Heart disease Father   . Heart disease Brother     CAD/MI  . Cancer Neg Hx     no colon cancer, no prostate cancer    . Colon cancer Neg Hx   . Esophageal cancer Neg Hx   . Rectal cancer Neg Hx   . Stomach cancer Neg Hx     Social History Social History  Substance Use Topics  . Smoking status: Current Every Day Smoker    Packs/day: 1.00    Years: 25.00    Types: Cigars  . Smokeless tobacco: Never Used  . Alcohol use 9.0 oz/week    15 Standard drinks or equivalent per week     Allergies   Cardura [doxazosin mesylate]   Review of Systems Review of Systems  Neurological: Positive for weakness.  All other systems reviewed and are negative.    Physical Exam Updated Vital Signs BP 168/90   Pulse 67   Temp 98.3 F (36.8 C)   Resp 22   Ht 5\' 7"  (1.702 m)   Wt 180 lb (81.6 kg)   SpO2 100%   BMI 28.19 kg/m   Physical Exam  Constitutional: He is oriented to person, place, and time.  Anxious   HENT:  Head: Normocephalic.  Mouth/Throat: Oropharynx is clear and moist.  Eyes: EOM are normal. Pupils are equal, round, and reactive to light.  Neck: Normal range of motion. Neck supple.  Cardiovascular: Normal rate, regular rhythm and normal heart sounds.   Pulmonary/Chest: Effort normal and breath sounds normal. No respiratory distress. He has no wheezes.  Abdominal: Soft. Bowel sounds are normal. He exhibits no distension. There is no tenderness.  Musculoskeletal: Normal range of motion.  Neurological: He is alert and oriented to person, place, and time.  CN 2-12 intact. Slightly dec sensation R thumb, nl strength throughout. Nl reflexes. Nl finger to nose bilaterally   Skin: Skin is warm.  Psychiatric: He has a normal mood and affect.  Nursing note and vitals reviewed.    ED Treatments / Results  Labs (all labs ordered are listed, but only abnormal results are displayed) Labs Reviewed  COMPREHENSIVE METABOLIC PANEL - Abnormal; Notable for the following:       Result Value   Glucose, Bld 119 (*)    GFR calc non Af Amer 59 (*)    All other components within normal limits   URINALYSIS, ROUTINE W REFLEX MICROSCOPIC - Abnormal; Notable for the following:    Hgb urine dipstick LARGE (*)    Bacteria, UA MANY (*)    Squamous Epithelial / LPF 0-5 (*)    All other components within normal limits  I-STAT CHEM 8, ED - Abnormal; Notable for the following:    Creatinine, Ser 1.30 (*)    Glucose, Bld 120 (*)    Calcium, Ion 1.10 (*)    Hemoglobin 12.9 (*)    HCT 38.0 (*)  All other components within normal limits  PROTIME-INR  APTT  CBC  DIFFERENTIAL  I-STAT TROPOININ, ED  CBG MONITORING, ED    EKG  EKG Interpretation None       Radiology Ct Head Wo Contrast  Result Date: 06/29/2016 CLINICAL DATA:  83 year old male with history of right-sided weakness this morning at 9 a.m., which has since resolved. Right arm numbness and weakness. EXAM: CT HEAD WITHOUT CONTRAST TECHNIQUE: Contiguous axial images were obtained from the base of the skull through the vertex without intravenous contrast. COMPARISON:  No priors. FINDINGS: Brain: Mild cerebral atrophy. Patchy and confluent areas of decreased attenuation are noted throughout the deep and periventricular white matter of the cerebral hemispheres bilaterally, compatible with chronic microvascular ischemic disease. Physiologic calcifications in the basal ganglia bilaterally. No evidence of acute infarction, hemorrhage, hydrocephalus, extra-axial collection or mass lesion/mass effect. Vascular: No hyperdense vessel or unexpected calcification. Skull: Normal. Negative for fracture or focal lesion. Sinuses/Orbits: Extensive mucosal thickening in the maxillary sinuses bilaterally (left greater than right). Mild multifocal mucosal thickening in the ethmoid sinuses bilaterally. No air-fluid levels are noted in the paranasal sinuses. Other: None. IMPRESSION: 1. No acute intracranial abnormalities. 2. Mild cerebral atrophy with extensive chronic microvascular ischemic changes in the cerebral white matter, as above. Electronically  Signed   By: Vinnie Langton M.D.   On: 06/29/2016 19:43   Mr Brain Wo Contrast  Result Date: 06/29/2016 CLINICAL DATA:  Initial evaluation for acute right arm numbness, evaluate for stroke. The EXAM: MRI HEAD WITHOUT CONTRAST TECHNIQUE: Multiplanar, multiecho pulse sequences of the brain and surrounding structures were obtained without intravenous contrast. COMPARISON:  Comparison made with prior head CT from earlier the same day. FINDINGS: Brain: Diffuse prominence of the CSF containing spaces is compatible with generalized cerebral atrophy, moderate in nature. Patchy and confluent T2/FLAIR hyperintensity within the periventricular and deep white matter both cerebral hemispheres most consistent with chronic microvascular disease, also moderate in nature. Few scattered superimposed small remote lacunar infarcts noted within the bilateral basal ganglia. There is a single punctate 4 mm focus of diffusion abnormality within the lateral left thalamus (series 4, image 21), suspicious for possible tiny acute/early subacute ischemic infarct. Corresponding signal abnormality difficult to discern on ADC map given small size. No evidence for associated hemorrhage. No other evidence for acute or subacute ischemia. No made of a subcentimeter focus of susceptibility artifact within the inferior left cerebellar hemisphere, compatible with a small chronic microhemorrhage. This is likely hypertensive in etiology. No other evidence for chronic hemorrhage. No mass lesion, midline shift or mass effect. No hydrocephalus. No extra-axial fluid collection. Major dural sinuses are patent. Pituitary gland and suprasellar region within normal limits. Vascular: Major intracranial vascular flow voids are maintained. Skull and upper cervical spine: Craniocervical junction normal. Visualized upper cervical spine unremarkable. Bone marrow signal intensity within normal limits. No scalp soft tissue abnormality. Sinuses/Orbits: Globes and  orbits within normal limits. Patient is status post lens extraction bilaterally. Moderate mucosal thickening throughout the paranasal sinuses, greatest within the ethmoidal air cells and maxillary sinuses. No air-fluid level to suggest active sinus infection. Small left mastoid effusion noted. Inner ear structures grossly normal. IMPRESSION: 1. Punctate 4 mm focus of diffusion abnormality within the left thalamus, suspicious for possible tiny acute/ early subacute ischemic infarct. No associated hemorrhage. 2. No other acute intracranial process identified. 3. Moderate age-related cerebral atrophy with chronic microvascular ischemic disease. Electronically Signed   By: Jeannine Boga M.D.   On: 06/29/2016 22:47  Procedures Procedures (including critical care time)  Medications Ordered in ED Medications  aspirin tablet 325 mg (not administered)     Initial Impression / Assessment and Plan / ED Course  I have reviewed the triage vital signs and the nursing notes.  Pertinent labs & imaging results that were available during my care of the patient were reviewed by me and considered in my medical decision making (see chart for details).    LYALL TUMOLO is a 83 y.o. male here with R arm numbness, weakness. Consider TIA vs stroke. Will get labs, MRI brain.   11:20 PM Patient's MRI showed acute stroke L thalamic area. Given ASA. Has some hematuria but no UTI. Hospitalist to admit.   Final Clinical Impressions(s) / ED Diagnoses   Final diagnoses:  None    New Prescriptions New Prescriptions   No medications on file     Drenda Freeze, MD 06/29/16 2323

## 2016-06-29 NOTE — ED Triage Notes (Signed)
Patient said he was having right sided weakness that started this morning about 0900hrs but is has resolved.  He said he was able to walk but his right arm was numb and weak.  The patient denies any pain, dizziness, or any other symptoms. He is having hematuria and was going to call the urologist because he has had the blood in the urine for two days.  The patient was able to walk, he is a [employer], he is no longer feeling numbness or weakness.  He denies any pain.

## 2016-06-29 NOTE — ED Notes (Signed)
Pt reporting that numbness in the RIGHT hand is worsening; Darl Householder, MD informed of same; no new orders

## 2016-06-30 ENCOUNTER — Observation Stay (HOSPITAL_BASED_OUTPATIENT_CLINIC_OR_DEPARTMENT_OTHER): Payer: Medicare Other

## 2016-06-30 ENCOUNTER — Observation Stay (HOSPITAL_COMMUNITY): Payer: Medicare Other

## 2016-06-30 ENCOUNTER — Encounter (HOSPITAL_COMMUNITY): Payer: Self-pay | Admitting: Internal Medicine

## 2016-06-30 DIAGNOSIS — I10 Essential (primary) hypertension: Secondary | ICD-10-CM | POA: Diagnosis not present

## 2016-06-30 DIAGNOSIS — N400 Enlarged prostate without lower urinary tract symptoms: Secondary | ICD-10-CM | POA: Diagnosis present

## 2016-06-30 DIAGNOSIS — R531 Weakness: Secondary | ICD-10-CM | POA: Diagnosis present

## 2016-06-30 DIAGNOSIS — F17290 Nicotine dependence, other tobacco product, uncomplicated: Secondary | ICD-10-CM | POA: Diagnosis present

## 2016-06-30 DIAGNOSIS — I6789 Other cerebrovascular disease: Secondary | ICD-10-CM

## 2016-06-30 DIAGNOSIS — J449 Chronic obstructive pulmonary disease, unspecified: Secondary | ICD-10-CM | POA: Diagnosis present

## 2016-06-30 DIAGNOSIS — I6601 Occlusion and stenosis of right middle cerebral artery: Secondary | ICD-10-CM | POA: Diagnosis not present

## 2016-06-30 DIAGNOSIS — Z85828 Personal history of other malignant neoplasm of skin: Secondary | ICD-10-CM | POA: Diagnosis not present

## 2016-06-30 DIAGNOSIS — R319 Hematuria, unspecified: Secondary | ICD-10-CM | POA: Diagnosis not present

## 2016-06-30 DIAGNOSIS — Z8673 Personal history of transient ischemic attack (TIA), and cerebral infarction without residual deficits: Secondary | ICD-10-CM | POA: Diagnosis not present

## 2016-06-30 DIAGNOSIS — G8191 Hemiplegia, unspecified affecting right dominant side: Secondary | ICD-10-CM | POA: Diagnosis present

## 2016-06-30 DIAGNOSIS — E785 Hyperlipidemia, unspecified: Secondary | ICD-10-CM | POA: Diagnosis present

## 2016-06-30 DIAGNOSIS — K219 Gastro-esophageal reflux disease without esophagitis: Secondary | ICD-10-CM | POA: Diagnosis present

## 2016-06-30 DIAGNOSIS — Z7982 Long term (current) use of aspirin: Secondary | ICD-10-CM | POA: Diagnosis not present

## 2016-06-30 DIAGNOSIS — I639 Cerebral infarction, unspecified: Secondary | ICD-10-CM | POA: Diagnosis not present

## 2016-06-30 DIAGNOSIS — Z8249 Family history of ischemic heart disease and other diseases of the circulatory system: Secondary | ICD-10-CM | POA: Diagnosis not present

## 2016-06-30 DIAGNOSIS — I358 Other nonrheumatic aortic valve disorders: Secondary | ICD-10-CM | POA: Diagnosis present

## 2016-06-30 DIAGNOSIS — R001 Bradycardia, unspecified: Secondary | ICD-10-CM | POA: Diagnosis present

## 2016-06-30 DIAGNOSIS — Z888 Allergy status to other drugs, medicaments and biological substances status: Secondary | ICD-10-CM | POA: Diagnosis not present

## 2016-06-30 DIAGNOSIS — E538 Deficiency of other specified B group vitamins: Secondary | ICD-10-CM | POA: Diagnosis present

## 2016-06-30 LAB — LIPID PANEL
Cholesterol: 214 mg/dL — ABNORMAL HIGH (ref 0–200)
HDL: 47 mg/dL (ref >40)
LDL CALC: 139 mg/dL — AB (ref 0–99)
Total CHOL/HDL Ratio: 4.6 RATIO
Triglycerides: 140 mg/dL (ref <150)
VLDL: 28 mg/dL (ref 0–40)

## 2016-06-30 LAB — COMPREHENSIVE METABOLIC PANEL
ALBUMIN: 3.7 g/dL (ref 3.5–5.0)
ALK PHOS: 53 U/L (ref 38–126)
ALT: 17 U/L (ref 17–63)
AST: 24 U/L (ref 15–41)
Anion gap: 11 (ref 5–15)
BUN: 14 mg/dL (ref 6–20)
CALCIUM: 9 mg/dL (ref 8.9–10.3)
CHLORIDE: 105 mmol/L (ref 101–111)
CO2: 25 mmol/L (ref 22–32)
Creatinine, Ser: 1.15 mg/dL (ref 0.61–1.24)
GFR calc non Af Amer: 58 mL/min — ABNORMAL LOW (ref >60)
GLUCOSE: 147 mg/dL — AB (ref 65–99)
Potassium: 3.7 mmol/L (ref 3.5–5.1)
SODIUM: 141 mmol/L (ref 135–145)
Total Bilirubin: 0.7 mg/dL (ref 0.3–1.2)
Total Protein: 6.2 g/dL — ABNORMAL LOW (ref 6.5–8.1)

## 2016-06-30 LAB — VAS US CAROTID
LCCADSYS: -80 cm/s
LCCAPDIAS: 16 cm/s
LEFT ECA DIAS: -31 cm/s
LEFT VERTEBRAL DIAS: 13 cm/s
LICAPDIAS: 54 cm/s
Left CCA dist dias: -19 cm/s
Left CCA prox sys: 88 cm/s
Left ICA dist dias: -24 cm/s
Left ICA dist sys: -86 cm/s
Left ICA prox sys: 252 cm/s
RCCAPDIAS: 14 cm/s
RCCAPSYS: 80 cm/s
RIGHT ECA DIAS: -13 cm/s
RIGHT VERTEBRAL DIAS: 11 cm/s
Right cca dist sys: -57 cm/s

## 2016-06-30 LAB — CBC WITH DIFFERENTIAL/PLATELET
BASOS ABS: 0 10*3/uL (ref 0.0–0.1)
Basophils Relative: 0 %
Eosinophils Absolute: 0.3 10*3/uL (ref 0.0–0.7)
Eosinophils Relative: 4 %
HEMATOCRIT: 41 % (ref 39.0–52.0)
Hemoglobin: 13.5 g/dL (ref 13.0–17.0)
LYMPHS ABS: 2.4 10*3/uL (ref 0.7–4.0)
LYMPHS PCT: 30 %
MCH: 29.8 pg (ref 26.0–34.0)
MCHC: 32.9 g/dL (ref 30.0–36.0)
MCV: 90.5 fL (ref 78.0–100.0)
MONO ABS: 0.5 10*3/uL (ref 0.1–1.0)
MONOS PCT: 6 %
NEUTROS ABS: 4.6 10*3/uL (ref 1.7–7.7)
Neutrophils Relative %: 60 %
Platelets: 152 10*3/uL (ref 150–400)
RBC: 4.53 MIL/uL (ref 4.22–5.81)
RDW: 13.1 % (ref 11.5–15.5)
WBC: 7.8 10*3/uL (ref 4.0–10.5)

## 2016-06-30 LAB — ECHOCARDIOGRAM COMPLETE
AVLVOTPG: 4 mmHg
CHL CUP MV DEC (S): 246
EWDT: 246 ms
FS: 33 % (ref 28–44)
Height: 69 in
IVS/LV PW RATIO, ED: 1.08
LA diam end sys: 41 mm
LA vol: 53.6 mL
LADIAMINDEX: 2.08 cm/m2
LASIZE: 41 mm
LAVOLA4C: 41.5 mL
LAVOLIN: 27.2 mL/m2
LV PW d: 13 mm — AB (ref 0.6–1.1)
LV TDI E'LATERAL: 8.81
LV TDI E'MEDIAL: 4.9
LV e' LATERAL: 8.81 cm/s
LVOT SV: 88 mL
LVOT VTI: 21.1 cm
LVOT area: 4.15 cm2
LVOT peak vel: 99.8 cm/s
LVOTD: 23 mm
Lateral S' vel: 12.8 cm/s
MV pk E vel: 1 m/s
Reg peak vel: 206 cm/s
TAPSE: 29 mm
TR max vel: 206 cm/s
Weight: 2860.8 oz

## 2016-06-30 MED ORDER — ATENOLOL 25 MG PO TABS
50.0000 mg | ORAL_TABLET | Freq: Two times a day (BID) | ORAL | Status: DC
  Administered 2016-06-30 (×3): 50 mg via ORAL
  Filled 2016-06-30 (×3): qty 2

## 2016-06-30 MED ORDER — PANTOPRAZOLE SODIUM 40 MG PO TBEC
40.0000 mg | DELAYED_RELEASE_TABLET | Freq: Every day | ORAL | Status: DC
  Administered 2016-06-30: 40 mg via ORAL
  Filled 2016-06-30: qty 1

## 2016-06-30 MED ORDER — ACETAMINOPHEN 650 MG RE SUPP: 650.0000 mg | RECTAL | Status: DC | PRN

## 2016-06-30 MED ORDER — VITAMIN B-12 1000 MCG PO TABS
1000.0000 ug | ORAL_TABLET | Freq: Every day | ORAL | Status: DC
  Administered 2016-06-30: 1000 ug via ORAL
  Filled 2016-06-30: qty 1

## 2016-06-30 MED ORDER — CLOPIDOGREL BISULFATE 75 MG PO TABS
75.0000 mg | ORAL_TABLET | Freq: Every day | ORAL | Status: DC
  Administered 2016-06-30: 75 mg via ORAL
  Filled 2016-06-30: qty 1

## 2016-06-30 MED ORDER — TRAMADOL HCL 50 MG PO TABS: 50.0000 mg | ORAL_TABLET | Freq: Two times a day (BID) | ORAL | Status: DC | PRN

## 2016-06-30 MED ORDER — ASPIRIN 300 MG RE SUPP: 300.0000 mg | Freq: Every day | RECTAL | Status: DC

## 2016-06-30 MED ORDER — DEXTROSE 5 % IV SOLN
1.0000 g | Freq: Every day | INTRAVENOUS | Status: DC
  Administered 2016-06-30 – 2016-07-01 (×2): 1 g via INTRAVENOUS
  Filled 2016-06-30 (×2): qty 10

## 2016-06-30 MED ORDER — ALFUZOSIN HCL ER 10 MG PO TB24
10.0000 mg | ORAL_TABLET | Freq: Every day | ORAL | Status: DC
  Administered 2016-06-30: 10 mg via ORAL
  Filled 2016-06-30 (×2): qty 1

## 2016-06-30 MED ORDER — STROKE: EARLY STAGES OF RECOVERY BOOK
Freq: Once | Status: AC
  Administered 2016-06-30: 03:00:00
  Filled 2016-06-30: qty 1

## 2016-06-30 MED ORDER — SODIUM CHLORIDE 0.9 % IV SOLN
INTRAVENOUS | Status: AC
  Administered 2016-06-30 (×2): via INTRAVENOUS

## 2016-06-30 MED ORDER — ATORVASTATIN CALCIUM 40 MG PO TABS
40.0000 mg | ORAL_TABLET | Freq: Every day | ORAL | Status: DC
  Administered 2016-06-30: 40 mg via ORAL
  Filled 2016-06-30 (×2): qty 1

## 2016-06-30 MED ORDER — ACETAMINOPHEN 325 MG PO TABS: 650.0000 mg | ORAL_TABLET | ORAL | Status: DC | PRN

## 2016-06-30 MED ORDER — ASPIRIN 325 MG PO TABS
325.0000 mg | ORAL_TABLET | Freq: Every day | ORAL | Status: DC
  Administered 2016-06-30: 325 mg via ORAL
  Filled 2016-06-30: qty 1

## 2016-06-30 MED ORDER — VITAMIN B-12 1000 MCG SL SUBL: 1.0000 | SUBLINGUAL_TABLET | Freq: Every day | SUBLINGUAL | Status: DC

## 2016-06-30 MED ORDER — ACETAMINOPHEN 160 MG/5ML PO SOLN: 650.0000 mg | ORAL | Status: DC | PRN

## 2016-06-30 MED ORDER — LOSARTAN POTASSIUM 50 MG PO TABS
100.0000 mg | ORAL_TABLET | Freq: Every day | ORAL | Status: DC
  Administered 2016-06-30: 100 mg via ORAL
  Filled 2016-06-30: qty 2

## 2016-06-30 NOTE — Evaluation (Signed)
Physical Therapy Evaluation Patient Details Name: Austin Torres MRN: [id number] DOB: 11-22-1933 Today's Date: 06/30/2016   History of Present Illness  83 y.o. male with history of hypertension, tobacco abuse presents to the ER because of right upper extremity numbness. Imaging showed L thalamic stroke.   Clinical Impression  Pt reports R sided weakness has completely resolved. He is independent with mobility, he ambulated 300' without an assistive device, no loss of balance with high level balance challenges. From PT standpoint he is ready to DC home, no further PT indicated. PT signing off.     Follow Up Recommendations No PT follow up    Equipment Recommendations  None recommended by PT    Recommendations for Other Services       Precautions / Restrictions Precautions Precautions: None Restrictions Weight Bearing Restrictions: No      Mobility  Bed Mobility Overal bed mobility: Independent                Transfers Overall transfer level: Independent                  Ambulation/Gait Ambulation/Gait assistance: Independent Ambulation Distance (Feet): 300 Feet Assistive device: None Gait Pattern/deviations: WFL(Within Functional Limits)   Gait velocity interpretation: at or above normal speed for age/gender General Gait Details: no LOB with head turns  Stairs            Wheelchair Mobility    Modified Rankin (Stroke Patients Only)       Balance Overall balance assessment: Independent (no LOB with squating to floor, turning 360* in standing, reaching forward over BOS)                                           Pertinent Vitals/Pain Pain Assessment: No/denies pain    Home Living Family/patient expects to be discharged to:: Private residence Living Arrangements: Spouse/significant other;Children Available Help at Discharge: Family;Available 24 hours/day Type of Home: House       Home Layout: Two level;Able to live  on main level with bedroom/bathroom Home Equipment: None      Prior Function Level of Independence: Independent         Comments: independent ADLs, walked without AD, no falls in past 1 year, drives     Hand Dominance        Extremity/Trunk Assessment   Upper Extremity Assessment Upper Extremity Assessment: Overall WFL for tasks assessed    Lower Extremity Assessment Lower Extremity Assessment: Overall WFL for tasks assessed    Cervical / Trunk Assessment Cervical / Trunk Assessment: Normal  Communication   Communication: No difficulties  Cognition Arousal/Alertness: Awake/alert Behavior During Therapy: WFL for tasks assessed/performed Overall Cognitive Status: Within Functional Limits for tasks assessed                      General Comments      Exercises     Assessment/Plan    PT Assessment Patent does not need any further PT services  PT Problem List            PT Treatment Interventions      PT Goals (Current goals can be found in the Care Plan section)  Acute Rehab PT Goals Patient Stated Goal: return to doing gardening PT Goal Formulation: All assessment and education complete, DC therapy    Frequency     Barriers  to discharge        Co-evaluation               End of Session Equipment Utilized During Treatment: Gait belt Activity Tolerance: Patient tolerated treatment well Patient left: in chair;with call bell/phone within reach;with nursing/sitter in room Nurse Communication: Mobility status    Functional Assessment Tool Used: clinical judgement Functional Limitation: Mobility: Walking and moving around Mobility: Walking and Moving Around Current Status [phone]): 0 percent impaired, limited or restricted Mobility: Walking and Moving Around Goal Status [phone]): 0 percent impaired, limited or restricted Mobility: Walking and Moving Around Discharge Status [phone]): 0 percent impaired, limited or restricted    Time:  1002-1031 PT Time Calculation (min) (ACUTE ONLY): 29 min   Charges:   PT Evaluation $PT Eval Low Complexity: 1 Procedure PT Treatments $Gait Training: 8-22 mins   PT G Codes:   PT G-Codes **NOT FOR INPATIENT CLASS** Functional Assessment Tool Used: clinical judgement Functional Limitation: Mobility: Walking and moving around Mobility: Walking and Moving Around Current Status [id number]): 0 percent impaired, limited or restricted Mobility: Walking and Moving Around Goal Status [phone]): 0 percent impaired, limited or restricted Mobility: Walking and Moving Around Discharge Status [phone]): 0 percent impaired, limited or restricted    Philomena Doheny 06/30/2016, 10:35 AM [phone]

## 2016-06-30 NOTE — H&P (Signed)
History and Physical    Austin Torres [id number] DOB: May 13, 1934 DOA: 06/29/2016  PCP: Walker Kehr, MD  Patient coming from: Home.  Chief Complaint: Right upper extremity numbness.  HPI: Austin Torres is a 83 y.o. male with history of hypertension, tobacco abuse presents to the ER because of right upper extremity numbness. Patient's symptoms started on 06/28/2016 with right upper extremity numbness which worsened yesterday. Denies any weakness of the extremities. Denies any visual symptoms difficulty speaking or swallowing. Patient thought his symptoms were secondary to shoveling snow last week. Since symptoms progressed patient came to the ER. MRI of the brain shows left thalamic stroke. Neurologist on-call was consulted. Patient is being admitted for further stroke workup. In addition patient also noticed some hematuria last 3 days. Hematuria happens only on starting urination. Then the urine becomes clear. Denies any dysuria fever or chills.   ED Course: MRI of the brain shows left thalamic stroke.  Review of Systems: As per HPI, rest all negative.   Past Medical History:  Diagnosis Date  . B12 deficiency   . BPH (benign prostatic hypertrophy)   . GERD (gastroesophageal reflux disease)   . Hyperlipidemia   . Hypertension   . Hypogonadism male   . Skin cancer     Past Surgical History:  Procedure Laterality Date  . APPENDECTOMY    . EYE SURGERY     glaucoma procedure -both eyes  . skin cancer excision     scalp  . TONSILLECTOMY       reports that he has been smoking Cigars.  He has a 25.00 pack-year smoking history. He has never used smokeless tobacco. He reports that he drinks about 9.0 oz of alcohol per week . He reports that he does not use drugs.  Allergies  Allergen Reactions  . Cardura [Doxazosin Mesylate] Other (See Comments)    dizziness    Family History  Problem Relation Age of Onset  . Heart disease Father   . Heart disease Brother    CAD/MI  . Cancer Neg Hx     no colon cancer, no prostate cancer  . Colon cancer Neg Hx   . Esophageal cancer Neg Hx   . Rectal cancer Neg Hx   . Stomach cancer Neg Hx     Prior to Admission medications   Medication Sig Start Date End Date Taking? Authorizing Provider  alfuzosin (UROXATRAL) 10 MG 24 hr tablet Take 1 tablet (10 mg total) by mouth daily with breakfast. 05/26/16  Yes Aleksei Plotnikov V, MD  atenolol (TENORMIN) 50 MG tablet Take 1 tablet (50 mg total) by mouth 2 (two) times daily. 05/26/16  Yes Aleksei Plotnikov V, MD  BABY ASPIRIN PO Take 81 mg by mouth daily.    Yes Historical Provider, MD  Cyanocobalamin (VITAMIN B-12) 1000 MCG SUBL Place 1 tablet (1,000 mcg total) under the tongue daily. 11/28/15  Yes Aleksei Plotnikov V, MD  diphenoxylate-atropine (LOMOTIL) 2.5-0.025 MG tablet Take 1 tablet by mouth 4 (four) times daily as needed for diarrhea or loose stools. 04/01/16  Yes Aleksei Plotnikov V, MD  losartan (COZAAR) 100 MG tablet Take 1 tablet (100 mg total) by mouth daily. 05/26/16  Yes Aleksei Plotnikov V, MD  omeprazole (PRILOSEC) 20 MG capsule Take 2 capsules (40 mg total) by mouth daily. Patient taking differently: Take 20 mg by mouth daily.  05/26/16  Yes Aleksei Plotnikov V, MD  traMADol (ULTRAM) 50 MG tablet TAKE 1/2-1 TABLET BY MOUTH EVERY 12 HOURS AS  NEEDED FOR SEVERE PAIN 05/28/16  Yes Cassandria Anger, MD    Physical Exam: Vitals:   06/30/16 0015 06/30/16 0030 06/30/16 0123 06/30/16 0200  BP: 173/85 170/83 (!) 183/80 (!) 185/85  Pulse: 61 61 (!) 58 63  Resp: 14 14 18 18   Temp:   97.8 F (36.6 C)   TempSrc:   Oral   SpO2: 94% 98% 99% 99%  Weight:   81.1 kg (178 lb 12.8 oz)   Height:   5\' 9"  (1.753 m)       Constitutional: Moderately built and nourished. Vitals:   06/30/16 0015 06/30/16 0030 06/30/16 0123 06/30/16 0200  BP: 173/85 170/83 (!) 183/80 (!) 185/85  Pulse: 61 61 (!) 58 63  Resp: 14 14 18 18   Temp:   97.8 F (36.6 C)   TempSrc:    Oral   SpO2: 94% 98% 99% 99%  Weight:   81.1 kg (178 lb 12.8 oz)   Height:   5\' 9"  (1.753 m)    Eyes: Anicteric no pallor. ENMT: No discharge from the ears eyes nose and mouth. Neck: No mass felt. No neck rigidity. Respiratory: No rhonchi or crepitations. Cardiovascular: S1-S2 heard no murmurs appreciated. Abdomen: Soft nontender bowel sounds present. No guarding or rigidity. Musculoskeletal: No edema. No joint effusion. Skin: No rash. Skin appears warm. Neurologic: Alert awake oriented to time place and person. Moves all extremities. Psychiatric: Appears normal. Normal affect.   Labs on Admission: I have personally reviewed following labs and imaging studies  CBC:  Recent Labs Lab 06/29/16 1830 06/29/16 1918  WBC 8.3  --   NEUTROABS 4.7  --   HGB 13.6 12.9*  HCT 39.9 38.0*  MCV 90.9  --   PLT 169  --    Basic Metabolic Panel:  Recent Labs Lab 06/29/16 1830 06/29/16 1918  NA 137 139  K 3.7 3.7  CL 103 103  CO2 23  --   GLUCOSE 119* 120*  BUN 14 17  CREATININE 1.14 1.30*  CALCIUM 9.1  --    GFR: Estimated Creatinine Clearance: 45.3 mL/min (by C-G formula based on SCr of 1.3 mg/dL (H)). Liver Function Tests:  Recent Labs Lab 06/29/16 1830  AST 21  ALT 17  ALKPHOS 51  BILITOT 0.6  PROT 6.7  ALBUMIN 4.0   No results for input(s): LIPASE, AMYLASE in the last 168 hours. No results for input(s): AMMONIA in the last 168 hours. Coagulation Profile:  Recent Labs Lab 06/29/16 1830  INR 1.04   Cardiac Enzymes: No results for input(s): CKTOTAL, CKMB, CKMBINDEX, TROPONINI in the last 168 hours. BNP (last 3 results) No results for input(s): PROBNP in the last 8760 hours. HbA1C: No results for input(s): HGBA1C in the last 72 hours. CBG: No results for input(s): GLUCAP in the last 168 hours. Lipid Profile: No results for input(s): CHOL, HDL, LDLCALC, TRIG, CHOLHDL, LDLDIRECT in the last 72 hours. Thyroid Function Tests: No results for input(s): TSH,  T4TOTAL, FREET4, T3FREE, THYROIDAB in the last 72 hours. Anemia Panel: No results for input(s): VITAMINB12, FOLATE, FERRITIN, TIBC, IRON, RETICCTPCT in the last 72 hours. Urine analysis:    Component Value Date/Time   COLORURINE YELLOW 06/29/2016 2150   APPEARANCEUR CLEAR 06/29/2016 2150   LABSPEC 1.009 06/29/2016 2150   PHURINE 5.0 06/29/2016 2150   GLUCOSEU NEGATIVE 06/29/2016 2150   GLUCOSEU NEGATIVE 04/01/2016 1443   [place] (A) 06/29/2016 2150   BILIRUBINUR NEGATIVE 06/29/2016 2150   [place] 06/29/2016 2150  PROTEINUR NEGATIVE 06/29/2016 2150   UROBILINOGEN 0.2 04/01/2016 1443   NITRITE NEGATIVE 06/29/2016 2150   LEUKOCYTESUR NEGATIVE 06/29/2016 2150   Sepsis Labs: @LABRCNTIP (procalcitonin:4,lacticidven:4) )No results found for this or any previous visit (from the past 240 hour(s)).   Radiological Exams on Admission: Ct Head Wo Contrast  Result Date: 06/29/2016 CLINICAL DATA:  83 year old male with history of right-sided weakness this morning at 9 a.m., which has since resolved. Right arm numbness and weakness. EXAM: CT HEAD WITHOUT CONTRAST TECHNIQUE: Contiguous axial images were obtained from the base of the skull through the vertex without intravenous contrast. COMPARISON:  No priors. FINDINGS: Brain: Mild cerebral atrophy. Patchy and confluent areas of decreased attenuation are noted throughout the deep and periventricular white matter of the cerebral hemispheres bilaterally, compatible with chronic microvascular ischemic disease. Physiologic calcifications in the basal ganglia bilaterally. No evidence of acute infarction, hemorrhage, hydrocephalus, extra-axial collection or mass lesion/mass effect. Vascular: No hyperdense vessel or unexpected calcification. Skull: Normal. Negative for fracture or focal lesion. Sinuses/Orbits: Extensive mucosal thickening in the maxillary sinuses bilaterally (left greater than right). Mild multifocal mucosal thickening in the  ethmoid sinuses bilaterally. No air-fluid levels are noted in the paranasal sinuses. Other: None. IMPRESSION: 1. No acute intracranial abnormalities. 2. Mild cerebral atrophy with extensive chronic microvascular ischemic changes in the cerebral white matter, as above. Electronically Signed   By: Vinnie Langton M.D.   On: 06/29/2016 19:43   Mr Brain Wo Contrast  Result Date: 06/29/2016 CLINICAL DATA:  Initial evaluation for acute right arm numbness, evaluate for stroke. The EXAM: MRI HEAD WITHOUT CONTRAST TECHNIQUE: Multiplanar, multiecho pulse sequences of the brain and surrounding structures were obtained without intravenous contrast. COMPARISON:  Comparison made with prior head CT from earlier the same day. FINDINGS: Brain: Diffuse prominence of the CSF containing spaces is compatible with generalized cerebral atrophy, moderate in nature. Patchy and confluent T2/FLAIR hyperintensity within the periventricular and deep white matter both cerebral hemispheres most consistent with chronic microvascular disease, also moderate in nature. Few scattered superimposed small remote lacunar infarcts noted within the bilateral basal ganglia. There is a single punctate 4 mm focus of diffusion abnormality within the lateral left thalamus (series 4, image 21), suspicious for possible tiny acute/early subacute ischemic infarct. Corresponding signal abnormality difficult to discern on ADC map given small size. No evidence for associated hemorrhage. No other evidence for acute or subacute ischemia. No made of a subcentimeter focus of susceptibility artifact within the inferior left cerebellar hemisphere, compatible with a small chronic microhemorrhage. This is likely hypertensive in etiology. No other evidence for chronic hemorrhage. No mass lesion, midline shift or mass effect. No hydrocephalus. No extra-axial fluid collection. Major dural sinuses are patent. Pituitary gland and suprasellar region within normal limits.  Vascular: Major intracranial vascular flow voids are maintained. Skull and upper cervical spine: Craniocervical junction normal. Visualized upper cervical spine unremarkable. Bone marrow signal intensity within normal limits. No scalp soft tissue abnormality. Sinuses/Orbits: Globes and orbits within normal limits. Patient is status post lens extraction bilaterally. Moderate mucosal thickening throughout the paranasal sinuses, greatest within the ethmoidal air cells and maxillary sinuses. No air-fluid level to suggest active sinus infection. Small left mastoid effusion noted. Inner ear structures grossly normal. IMPRESSION: 1. Punctate 4 mm focus of diffusion abnormality within the left thalamus, suspicious for possible tiny acute/ early subacute ischemic infarct. No associated hemorrhage. 2. No other acute intracranial process identified. 3. Moderate age-related cerebral atrophy with chronic microvascular ischemic disease. Electronically Signed   By:  Jeannine Boga M.D.   On: 06/29/2016 22:47    EKG: Independently reviewed. Normal sinus rhythm.  Assessment/Plan Principal Problem:   Acute ischemic stroke [facility]) Active Problems:   Essential hypertension   Hematuria   Stroke (cerebrum) ([place])    1. Acute ischemic stroke - appreciate neurology consult. Patient has passed swallow. Will keep patient on neuro checks. Check hemoglobin A1c and lipid panel MRA brain 2-D echo carotid Doppler. On aspirin. 2. Hypertension uncontrolled - allow for permissive hypertension due to stroke. Continue Cozaar and beta blocker. 3. Hematuria - since urine shows bacteria I have placed patient on ceftriaxone. Check urine cultures. Continue Uraxtral. 4. Tobacco abuse - tobacco cessation counseling requested.   DVT prophylaxis: SCDs due to hematuria. Code Status: Full code.  Family Communication: Discussed with patient.  Disposition Plan: Home.  Consults called: Neurology and physical therapy.  Admission status:  Observation.    Rise Patience MD [facility] Pager [phone].  If 7PM-7AM, please contact night-coverage [email or web address] Password [email or web address]  06/30/2016, 2:36 AM

## 2016-06-30 NOTE — Progress Notes (Signed)
OT Cancellation Note  Patient Details Name: Austin Torres MRN: [id number] DOB: Jun 19, 1933   Cancelled Treatment:    Reason Eval/Treat Not Completed: Patient at procedure or test/ unavailable. Pt off unit at MRI at this time. OT will check back as able for evaluation.    [address], OTR/L [id number] 06/30/2016, 8:14 AM

## 2016-06-30 NOTE — Progress Notes (Signed)
*  PRELIMINARY RESULTS* Vascular Ultrasound Carotid Duplex (Doppler) has been completed.  Preliminary findings: Right 1-39% ICA stenosis. Left 40-59% ICA stenosis, upper end of scale. Calcified plaque at bulb may obscure higher velocities.  Antegrade vertebral flow.   Landry Mellow, RDMS, RVT  06/30/2016, 2:31 PM

## 2016-06-30 NOTE — Progress Notes (Signed)
Patient admitted after midnight, please see H&P.  Await echo results and neuro recommendations for d/c Austin Bear DO

## 2016-06-30 NOTE — Progress Notes (Signed)
  Echocardiogram 2D Echocardiogram has been performed.  Austin Torres M 06/30/2016, 1:47 PM

## 2016-06-30 NOTE — Care Management Note (Signed)
Case Management Note  Patient Details  Name: Austin Torres MRN: [id number] Date of Birth: 11-07-1933  Subjective/Objective:                  Patient was admitted with CVA. Lives at home with spouse. CM will follow for discharge needs pending PT/OT evals and physician orders.   Action/Plan:   Expected Discharge Date:                  Expected Discharge Plan:     In-House Referral:     Discharge planning Services     Post Acute Care Choice:    Choice offered to:     DME Arranged:    DME Agency:     HH Arranged:    HH Agency:     Status of Service:     If discussed at [employer] of Stay Meetings, dates discussed:    Additional Comments:  Rolm Baptise, RN 06/30/2016, 11:38 AM

## 2016-06-30 NOTE — Progress Notes (Signed)
Pt admitted from ED with stroke diagnosis, alert and oriented, denies any pain at this time, pt settled in bed with call light at bedside, tele monitor put and verified on pt, pt reassured, will however continue to monitor. [place]-As[place], Nikola Marone Efe

## 2016-06-30 NOTE — Evaluation (Addendum)
Occupational Therapy Evaluation Patient Details Name: CORD STRAUGHAN MRN: [id number] DOB: 08-24-1933 Today's Date: 06/30/2016    History of Present Illness 83 y.o. male with history of hypertension, tobacco abuse presents to the ER because of right upper extremity numbness. Imaging showed L thalamic stroke.    Clinical Impression   PTA, pt was independent with all ADL and functional mobility. Pt reports that R sided weakness and tingling have resolved. Pt currently presents with WNL strength and sensation of R Ue. He was able to complete ADL independently on OT evaluation. Cognition functionally in tact for ADL. Did demonstrate slight difficulty with short-term memory in delayed recall but pt reports this is his baseline. Pt able to recall events of the day and instructions given by MD accurately. Educated pt on compensatory strategies for memory to use at home and pt reports understanding. No further acute OT needs identified and OT will sign off.    Follow Up Recommendations  No OT follow up    Equipment Recommendations  None recommended by OT    Recommendations for Other Services       Precautions / Restrictions Precautions Precautions: None Restrictions Weight Bearing Restrictions: No      Mobility Bed Mobility Overal bed mobility: Independent             General bed mobility comments: OOB in chair on arrival; independent per PT  Transfers Overall transfer level: Independent                    Balance Overall balance assessment: No apparent balance deficits (not formally assessed)                                          ADL Overall ADL's : Modified independent;At baseline                                       General ADL Comments: Increased time     Vision Vision Assessment?: No apparent visual deficits   Perception     Praxis      Pertinent Vitals/Pain Pain Assessment: No/denies pain     Hand Dominance  Right   Extremity/Trunk Assessment Upper Extremity Assessment Upper Extremity Assessment: Overall WFL for tasks assessed   Lower Extremity Assessment Lower Extremity Assessment: Overall WFL for tasks assessed   Cervical / Trunk Assessment Cervical / Trunk Assessment: Normal   Communication Communication Communication: No difficulties   Cognition Arousal/Alertness: Awake/alert Behavior During Therapy: WFL for tasks assessed/performed Overall Cognitive Status: Within Functional Limits for tasks assessed                     General Comments       Exercises       Shoulder Instructions      Home Living Family/patient expects to be discharged to:: Private residence Living Arrangements: Spouse/significant other;Children Available Help at Discharge: Family;Available 24 hours/day Type of Home: House       Home Layout: Two level;Able to live on main level with bedroom/bathroom     Bathroom Shower/Tub: Tub/shower unit         Home Equipment: None          Prior Functioning/Environment Level of Independence: Independent        Comments: likes to garden;  independent ADLs, walked without AD, no falls in past 1 year, drives        OT Problem List:     OT Treatment/Interventions:      OT Goals(Current goals can be found in the care plan section) Acute Rehab OT Goals Patient Stated Goal: return to doing gardening OT Goal Formulation: With patient Time For Goal Achievement: 07/07/16 Potential to Achieve Goals: Good  OT Frequency:     Barriers to D/C:            Co-evaluation              End of Session    Activity Tolerance: Patient tolerated treatment well Patient left: in chair;with call bell/phone within reach   Time: 1127-1149 OT Time Calculation (min): 22 min Charges:  OT General Charges $OT Visit: 1 Procedure OT Evaluation $OT Eval Moderate Complexity: 1 Procedure G-CodesNorman Herrlich, OTR/L [id number] 06/30/2016, 12:58  PM

## 2016-06-30 NOTE — Progress Notes (Signed)
Pharmacy Antibiotic Note  Austin Torres is a 83 y.o. male admitted on 06/29/2016 with UTI.  Pharmacy has been consulted for Rocephin dosing.  Plan: Rocephin 1gm IV q24h  Pharmacy will sign off - please reconsult if needed  Height: 5\' 9"  (175.3 cm) Weight: 178 lb 12.8 oz (81.1 kg) IBW/kg (Calculated) : 70.7  Temp (24hrs), Avg:98.1 F (36.7 C), Min:97.8 F (36.6 C), Max:98.3 F (36.8 C)   Recent Labs Lab 06/29/16 1830 06/29/16 1918 06/30/16 0321  WBC 8.3  --  7.8  CREATININE 1.14 1.30*  --     Estimated Creatinine Clearance: 45.3 mL/min (by C-G formula based on SCr of 1.3 mg/dL (H)).    Allergies  Allergen Reactions  . Cardura [Doxazosin Mesylate] Other (See Comments)    dizziness    Thank you for allowing pharmacy to be a part of this patient's care.  Sherlon Handing, PharmD, BCPS Clinical pharmacist, pager [phone] 06/30/2016 4:15 AM

## 2016-06-30 NOTE — Consult Note (Signed)
Admission H&P    Chief Complaint: Acute onset of right-sided numbness and weakness.  HPI: Austin Torres is an 83 y.o. male history of hypertension, hyperlipidemia and vitamin B-12 deficiency, presenting with new onset right-sided numbness and weakness. Symptom onset was at 9:00 AM on 06/30/2015. He has no previous history of stroke or TIA. He's been taking aspirin daily. There was no change in speech. No facial droop was described. Symptoms initially involved his right upper extremity with numbness and weakness, but subsequently involved his right leg with weakness. Deficits improved without intervention. He still has numbness involving his distal right upper extremity. CT scan of his head showed no acute findings. MRI showed findings consistent with small acute left thalamic infarction.  LSN: 9:00 AM on 06/29/2016 tPA Given: No: Beyond time window for treatment consideration mRankin:  Past Medical History:  Diagnosis Date  . B12 deficiency   . BPH (benign prostatic hypertrophy)   . GERD (gastroesophageal reflux disease)   . Hyperlipidemia   . Hypertension   . Hypogonadism male   . Skin cancer     Past Surgical History:  Procedure Laterality Date  . APPENDECTOMY    . EYE SURGERY     glaucoma procedure -both eyes  . skin cancer excision     scalp  . TONSILLECTOMY      Family History  Problem Relation Age of Onset  . Heart disease Father   . Heart disease Brother     CAD/MI  . Cancer Neg Hx     no colon cancer, no prostate cancer  . Colon cancer Neg Hx   . Esophageal cancer Neg Hx   . Rectal cancer Neg Hx   . Stomach cancer Neg Hx    Social History:  reports that he has been smoking Cigars.  He has a 25.00 pack-year smoking history. He has never used smokeless tobacco. He reports that he drinks about 9.0 oz of alcohol per week . He reports that he does not use drugs.  Allergies:  Allergies  Allergen Reactions  . Cardura [Doxazosin Mesylate] Other (See Comments)     dizziness    Medications: Preadmission medications were reviewed by me.  ROS: History obtained from the patient  General ROS: negative for - chills, fatigue, fever, night sweats, weight gain or weight loss Psychological ROS: negative for - behavioral disorder, hallucinations, memory difficulties, mood swings or suicidal ideation Ophthalmic ROS: negative for - blurry vision, double vision, eye pain or loss of vision ENT ROS: negative for - epistaxis, nasal discharge, oral lesions, sore throat, tinnitus or vertigo Allergy and Immunology ROS: negative for - hives or itchy/watery eyes Hematological and Lymphatic ROS: negative for - bleeding problems, bruising or swollen lymph nodes Endocrine ROS: negative for - galactorrhea, hair pattern changes, polydipsia/polyuria or temperature intolerance Respiratory ROS: negative for - cough, hemoptysis, shortness of breath or wheezing Cardiovascular ROS: negative for - chest pain, dyspnea on exertion, edema or irregular heartbeat Gastrointestinal ROS: negative for - abdominal pain, diarrhea, hematemesis, nausea/vomiting or stool incontinence Genito-Urinary ROS: negative for - dysuria, hematuria, incontinence or urinary frequency/urgency Musculoskeletal ROS: negative for - joint swelling or muscular weakness Neurological ROS: as noted in HPI Dermatological ROS: negative for rash and skin lesion changes  Physical Examination: Blood pressure 168/90, pulse 67, temperature 98.3 F (36.8 C), resp. rate 22, height '5\' 7"'$  (1.702 m), weight 81.6 kg (180 lb), SpO2 100 %.  HEENT-  Normocephalic, no lesions, without obvious abnormality.  Normal external eye and conjunctiva.  Normal TM's bilaterally.  Normal auditory canals and external ears. Normal external nose, mucus membranes and septum.  Normal pharynx. Neck supple with no masses, nodes, nodules or enlargement. Cardiovascular - regular rate and rhythm, S1, S2 normal, no murmur, click, rub or gallop Lungs -  chest clear, no wheezing, rales, normal symmetric air entry Abdomen - soft, non-tender; bowel sounds normal; no masses,  no organomegaly Extremities - no joint deformities, effusion, or inflammation  Neurologic Examination: Mental Status: Alert, oriented, thought content appropriate.  Speech fluent without evidence of aphasia. Able to follow commands without difficulty. Cranial Nerves: II-Visual fields were normal. III/IV/VI-Pupils were equal and reacted normally to light. Extraocular movements were full and conjugate.    V/VII-no facial numbness and no facial weakness. VIII-normal. X-normal speech and symmetrical palatal movement. XI: trapezius strength/neck flexion strength normal bilaterally XII-midline tongue extension with normal strength. Motor: 5/5 bilaterally with normal tone and bulk Sensory: Normal throughout. Deep Tendon Reflexes: 1+ and symmetric. Plantars: Flexor bilaterally Cerebellar: Normal finger-to-nose testing. Carotid auscultation: Normal  Results for orders placed or performed during the hospital encounter of 06/29/16 (from the past 48 hour(s))  Protime-INR     Status: None   Collection Time: 06/29/16  6:30 PM  Result Value Ref Range   Prothrombin Time 13.6 11.4 - 15.2 seconds   INR 1.04   APTT     Status: None   Collection Time: 06/29/16  6:30 PM  Result Value Ref Range   aPTT 29 24 - 36 seconds  CBC     Status: None   Collection Time: 06/29/16  6:30 PM  Result Value Ref Range   WBC 8.3 4.0 - 10.5 K/uL   RBC 4.39 4.22 - 5.81 MIL/uL   Hemoglobin 13.6 13.0 - 17.0 g/dL   HCT 86.8 97.6 - 93.0 %   MCV 90.9 78.0 - 100.0 fL   MCH 31.0 26.0 - 34.0 pg   MCHC 34.1 30.0 - 36.0 g/dL   RDW 08.8 90.1 - 38.5 %   Platelets 169 150 - 400 K/uL  Differential     Status: None   Collection Time: 06/29/16  6:30 PM  Result Value Ref Range   Neutrophils Relative % 57 %   Neutro Abs 4.7 1.7 - 7.7 K/uL   Lymphocytes Relative 31 %   Lymphs Abs 2.5 0.7 - 4.0 K/uL    Monocytes Relative 9 %   Monocytes Absolute 0.7 0.1 - 1.0 K/uL   Eosinophils Relative 3 %   Eosinophils Absolute 0.3 0.0 - 0.7 K/uL   Basophils Relative 0 %   Basophils Absolute 0.0 0.0 - 0.1 K/uL  Comprehensive metabolic panel     Status: Abnormal   Collection Time: 06/29/16  6:30 PM  Result Value Ref Range   Sodium 137 135 - 145 mmol/L   Potassium 3.7 3.5 - 5.1 mmol/L   Chloride 103 101 - 111 mmol/L   CO2 23 22 - 32 mmol/L   Glucose, Bld 119 (H) 65 - 99 mg/dL   BUN 14 6 - 20 mg/dL   Creatinine, Ser 5.28 0.61 - 1.24 mg/dL   Calcium 9.1 8.9 - 94.2 mg/dL   Total Protein 6.7 6.5 - 8.1 g/dL   Albumin 4.0 3.5 - 5.0 g/dL   AST 21 15 - 41 U/L   ALT 17 17 - 63 U/L   Alkaline Phosphatase 51 38 - 126 U/L   Total Bilirubin 0.6 0.3 - 1.2 mg/dL   GFR calc non Af Amer 59 (L) >60  mL/min   GFR calc Af Amer >60 >60 mL/min    Comment: (NOTE) The eGFR has been calculated using the CKD EPI equation. This calculation has not been validated in all clinical situations. eGFR's persistently <60 mL/min signify possible Chronic Kidney Disease.    Anion gap 11 5 - 15  I-stat troponin, ED     Status: None   Collection Time: 06/29/16  7:16 PM  Result Value Ref Range   Troponin i, poc 0.00 0.00 - 0.08 ng/mL   Comment 3            Comment: Due to the release kinetics of cTnI, a negative result within the first hours of the onset of symptoms does not rule out myocardial infarction with certainty. If myocardial infarction is still suspected, repeat the test at appropriate intervals.   I-Stat Chem 8, ED     Status: Abnormal   Collection Time: 06/29/16  7:18 PM  Result Value Ref Range   Sodium 139 135 - 145 mmol/L   Potassium 3.7 3.5 - 5.1 mmol/L   Chloride 103 101 - 111 mmol/L   BUN 17 6 - 20 mg/dL   Creatinine, Ser 1.30 (H) 0.61 - 1.24 mg/dL   Glucose, Bld 120 (H) 65 - 99 mg/dL   Calcium, Ion 1.10 (L) 1.15 - 1.40 mmol/L   TCO2 25 0 - 100 mmol/L   Hemoglobin 12.9 (L) 13.0 - 17.0 g/dL   HCT 38.0  (L) 39.0 - 52.0 %  Urinalysis, Routine w reflex microscopic     Status: Abnormal   Collection Time: 06/29/16  9:50 PM  Result Value Ref Range   Color, Urine YELLOW YELLOW   APPearance CLEAR CLEAR   Specific Gravity, Urine 1.009 1.005 - 1.030   pH 5.0 5.0 - 8.0   Glucose, UA NEGATIVE NEGATIVE mg/dL   Hgb urine dipstick LARGE (A) NEGATIVE   Bilirubin Urine NEGATIVE NEGATIVE   Ketones, ur NEGATIVE NEGATIVE mg/dL   Protein, ur NEGATIVE NEGATIVE mg/dL   Nitrite NEGATIVE NEGATIVE   Leukocytes, UA NEGATIVE NEGATIVE   RBC / HPF TOO NUMEROUS TO COUNT 0 - 5 RBC/hpf   WBC, UA 0-5 0 - 5 WBC/hpf   Bacteria, UA MANY (A) NONE SEEN   Squamous Epithelial / LPF 0-5 (A) NONE SEEN   Mucous PRESENT    Ct Head Wo Contrast  Result Date: 06/29/2016 CLINICAL DATA:  83 year old male with history of right-sided weakness this morning at 9 a.m., which has since resolved. Right arm numbness and weakness. EXAM: CT HEAD WITHOUT CONTRAST TECHNIQUE: Contiguous axial images were obtained from the base of the skull through the vertex without intravenous contrast. COMPARISON:  No priors. FINDINGS: Brain: Mild cerebral atrophy. Patchy and confluent areas of decreased attenuation are noted throughout the deep and periventricular white matter of the cerebral hemispheres bilaterally, compatible with chronic microvascular ischemic disease. Physiologic calcifications in the basal ganglia bilaterally. No evidence of acute infarction, hemorrhage, hydrocephalus, extra-axial collection or mass lesion/mass effect. Vascular: No hyperdense vessel or unexpected calcification. Skull: Normal. Negative for fracture or focal lesion. Sinuses/Orbits: Extensive mucosal thickening in the maxillary sinuses bilaterally (left greater than right). Mild multifocal mucosal thickening in the ethmoid sinuses bilaterally. No air-fluid levels are noted in the paranasal sinuses. Other: None. IMPRESSION: 1. No acute intracranial abnormalities. 2. Mild cerebral  atrophy with extensive chronic microvascular ischemic changes in the cerebral white matter, as above. Electronically Signed   By: Vinnie Langton M.D.   On: 06/29/2016 19:43   Mr  Brain Wo Contrast  Result Date: 06/29/2016 CLINICAL DATA:  Initial evaluation for acute right arm numbness, evaluate for stroke. The EXAM: MRI HEAD WITHOUT CONTRAST TECHNIQUE: Multiplanar, multiecho pulse sequences of the brain and surrounding structures were obtained without intravenous contrast. COMPARISON:  Comparison made with prior head CT from earlier the same day. FINDINGS: Brain: Diffuse prominence of the CSF containing spaces is compatible with generalized cerebral atrophy, moderate in nature. Patchy and confluent T2/FLAIR hyperintensity within the periventricular and deep white matter both cerebral hemispheres most consistent with chronic microvascular disease, also moderate in nature. Few scattered superimposed small remote lacunar infarcts noted within the bilateral basal ganglia. There is a single punctate 4 mm focus of diffusion abnormality within the lateral left thalamus (series 4, image 21), suspicious for possible tiny acute/early subacute ischemic infarct. Corresponding signal abnormality difficult to discern on ADC map given small size. No evidence for associated hemorrhage. No other evidence for acute or subacute ischemia. No made of a subcentimeter focus of susceptibility artifact within the inferior left cerebellar hemisphere, compatible with a small chronic microhemorrhage. This is likely hypertensive in etiology. No other evidence for chronic hemorrhage. No mass lesion, midline shift or mass effect. No hydrocephalus. No extra-axial fluid collection. Major dural sinuses are patent. Pituitary gland and suprasellar region within normal limits. Vascular: Major intracranial vascular flow voids are maintained. Skull and upper cervical spine: Craniocervical junction normal. Visualized upper cervical spine  unremarkable. Bone marrow signal intensity within normal limits. No scalp soft tissue abnormality. Sinuses/Orbits: Globes and orbits within normal limits. Patient is status post lens extraction bilaterally. Moderate mucosal thickening throughout the paranasal sinuses, greatest within the ethmoidal air cells and maxillary sinuses. No air-fluid level to suggest active sinus infection. Small left mastoid effusion noted. Inner ear structures grossly normal. IMPRESSION: 1. Punctate 4 mm focus of diffusion abnormality within the left thalamus, suspicious for possible tiny acute/ early subacute ischemic infarct. No associated hemorrhage. 2. No other acute intracranial process identified. 3. Moderate age-related cerebral atrophy with chronic microvascular ischemic disease. Electronically Signed   By: Jeannine Boga M.D.   On: 06/29/2016 22:47    Assessment: 83 y.o. male with multiple risk factors for stroke presenting with a small acute left thalamic ischemic infarction.  Stroke Risk Factors - hyperlipidemia and hypertension  Plan: 1. HgbA1c, fasting lipid panel 2. MRA  of the brain without contrast 3. PT consult, OT consult 4. Echocardiogram 5. Carotid dopplers 6. Prophylactic therapy-Antiplatelet med: Aspirin  7. Risk factor modification 8. Telemetry monitoring  C.R. Nicole Kindred, MD [facility] [phone]  06/30/2016, 12:18 AM

## 2016-06-30 NOTE — Progress Notes (Signed)
STROKE TEAM PROGRESS NOTE   HISTORY OF PRESENT ILLNESS (per record) Austin Torres is an 83 y.o. male history of hypertension, hyperlipidemia and vitamin B-12 deficiency, presenting with new onset right-sided numbness and weakness. Symptom onset was at 9:00 AM on 06/30/2015. He has no previous history of stroke or TIA. He's been taking aspirin daily. There was no change in speech. No facial droop was described. Symptoms initially involved his right upper extremity with numbness and weakness, but subsequently involved his right leg with weakness. Deficits improved without intervention. He still has numbness involving his distal right upper extremity. CT scan of his head showed no acute findings. MRI showed findings consistent with small acute left thalamic infarction.  LSN: 9:00 AM on 06/29/2016 tPA Given: No: Beyond time window for treatment consideration mRankin:    SUBJECTIVE (INTERVAL HISTORY) No family members present. The patient feels he is doing better. He was taking aspirin 81 mg daily prior to admission. Dr. Leonie Man told the patient that we would change aspirin to Plavix.   OBJECTIVE Temp:  [97.8 F (36.6 C)-98.3 F (36.8 C)] 97.8 F (36.6 C) (01/24 0123) Pulse Rate:  [53-67] 53 (01/24 0600) Cardiac Rhythm: Heart block (01/24 0134) Resp:  [14-22] 18 (01/24 0600) BP: (130-185)/(57-93) 150/81 (01/24 0600) SpO2:  [94 %-100 %] 97 % (01/24 0600) Weight:  [81.1 kg (178 lb 12.8 oz)-81.6 kg (180 lb)] 81.1 kg (178 lb 12.8 oz) (01/24 0123)  CBC:   Recent Labs Lab 06/29/16 1830 06/29/16 1918 06/30/16 0321  WBC 8.3  --  7.8  NEUTROABS 4.7  --  4.6  HGB 13.6 12.9* 13.5  HCT 39.9 38.0* 41.0  MCV 90.9  --  90.5  PLT 169  --  0000000    Basic Metabolic Panel:   Recent Labs Lab 06/29/16 1830 06/29/16 1918 06/30/16 0321  NA 137 139 141  K 3.7 3.7 3.7  CL 103 103 105  CO2 23  --  25  GLUCOSE 119* 120* 147*  BUN 14 17 14   CREATININE 1.14 1.30* 1.15  CALCIUM 9.1  --  9.0     Lipid Panel:     Component Value Date/Time   CHOL 214 (H) 06/30/2016 0321   TRIG 140 06/30/2016 0321   HDL 47 06/30/2016 0321   CHOLHDL 4.6 06/30/2016 0321   VLDL 28 06/30/2016 0321   LDLCALC 139 (H) 06/30/2016 0321   HgbA1c: No results found for: HGBA1C Urine Drug Screen: No results found for: LABOPIA, COCAINSCRNUR, LABBENZ, AMPHETMU, THCU, LABBARB    IMAGING  Ct Head Wo Contrast 06/29/2016 1. No acute intracranial abnormalities.  2. Mild cerebral atrophy with extensive chronic microvascular ischemic changes in the cerebral white matter, as above.     Mr Brain Wo Contrast 06/29/2016 1. Punctate 4 mm focus of diffusion abnormality within the left thalamus, suspicious for possible tiny acute/ early subacute ischemic infarct. No associated hemorrhage.  2. No other acute intracranial process identified.  3. Moderate age-related cerebral atrophy with chronic microvascular ischemic disease.    MR MRA Head Wo Contrast 06/30/2016 Normal posterior circulation Moderate stenosis right MCA bifurcation.    PHYSICAL EXAM Pleasant elderly caucasian male not in distress. . Afebrile. Head is nontraumatic. Neck is supple without bruit.    Cardiac exam no murmur or gallop. Lungs are clear to auscultation. Distal pulses are well felt.  Neurological Exam ;  Awake  Alert oriented x 3. Normal speech and language.eye movements full without nystagmus.fundi were not visualized. Vision acuity and fields appear  normal. Hearing is normal. Palatal movements are normal. Face symmetric. Tongue midline. Normal strength, tone, reflexes and coordination. Normal sensation. Gait deferred.        ASSESSMENT/PLAN [name] is a 83 y.o. male with history of hypertension, hyperlipidemia, and vitamin B-12 deficiency presenting with new onset right-sided numbness and weakness. He did not receive IV t-PA due to late presentation.  Stroke:  Dominant  infarct secondary to small vessel  disease.  Resultant  No residual deficits  MRI - Punctate 4 mm focus of diffusion abnormality within the left thalamus.  MRA - normal posterior circulation. Moderate stenosis right MCA bifurcation.  Carotid Doppler - 123456 LICA and 123456 RICA stenosis  2D Echo - pending  LDL - 139  HgbA1c - pending  VTE prophylaxis - SCDs Diet Heart Room service appropriate? Yes; Fluid consistency: Thin  aspirin 81 mg daily prior to admission, now on clopidogrel 75 mg daily  Patient counseled to be compliant with his antithrombotic medications  Ongoing aggressive stroke risk factor management  Therapy recommendations: No follow-up therapies recommended.  Disposition: Pending  Hypertension  Stable  Permissive hypertension (OK if < 220/120) but gradually normalize in 5-7 days  Long-term BP goal normotensive  Hyperlipidemia  Home meds:  No lipid lowering medications prior to admission  LDL 139, goal < 70  Start Lipitor 40 mg daily  Continue statin at discharge    Other Stroke Risk Factors  Advanced age  Cigar smoker - advised to stop smoking  ETOH use, advised to drink no more than 1 drink per day    Other Active Problems  Mild hyperglycemia - await hemoglobin A1c  Hospital day # 0  Mikey Bussing PA-C [facility] Pager [phone] 06/30/2016, 1:34 PM I have personally examined this patient, reviewed notes, independently viewed imaging studies, participated in medical decision making and plan of care.ROS completed by me personally and pertinent positives fully documented  I have made any additions or clarifications directly to the above note. Agree with note above. He presented with transient right-sided weakness and numbness due to small left thalamic infarct from small vessel disease. Recommend change aspirin to Plavix for stroke prevention and aggressive risk factor modification. Greater than 50% time during this 35 minute visit was spent on  counseling and coordination of care about stroke risk, prevention and treatment discussion.  Antony Contras, MD Medical Director [place] Pager: [phone] 06/30/2016 2:52 PM   To contact Stroke Continuity provider, please refer to [email or web address] After hours, contact General Neurology

## 2016-07-01 DIAGNOSIS — I10 Essential (primary) hypertension: Secondary | ICD-10-CM

## 2016-07-01 LAB — URINE CULTURE: Culture: <10000 — AB

## 2016-07-01 LAB — HEMOGLOBIN A1C
Hgb A1c MFr Bld: 6.2 % — ABNORMAL HIGH (ref 4.8–5.6)
MEAN PLASMA GLUCOSE: 131 mg/dL

## 2016-07-01 MED ORDER — CLOPIDOGREL BISULFATE 75 MG PO TABS: 75.0000 mg | ORAL_TABLET | Freq: Every day | ORAL | 0 refills | Status: DC

## 2016-07-01 MED ORDER — CEPHALEXIN 500 MG PO CAPS: 500.0000 mg | ORAL_CAPSULE | Freq: Two times a day (BID) | ORAL | 0 refills | Status: DC

## 2016-07-01 MED ORDER — PRAVASTATIN SODIUM 40 MG PO TABS: 40.0000 mg | ORAL_TABLET | Freq: Every day | ORAL | 0 refills | Status: DC

## 2016-07-01 NOTE — Discharge Instructions (Signed)
Hyperglycemia  Hyperglycemia occurs when the level of sugar (glucose) in the blood is too high. Glucose is a type of sugar that provides the body's main source of energy. Certain hormones (insulin and glucagon) control the level of glucose in the blood. Insulin lowers blood glucose, and glucagon increases blood glucose. Hyperglycemia can result from having too little insulin in the bloodstream, or from the body not responding normally to insulin.  Hyperglycemia occurs most often in people who have diabetes (diabetes mellitus), but it can happen in people who do not have diabetes. It can develop quickly, and it can be life-threatening if it causes you to become severely dehydrated (diabetic ketoacidosis or hyperglycemic hyperosmolar state). Severe hyperglycemia is a medical emergency.  What are the causes?  If you have diabetes, hyperglycemia may be caused by:  · Diabetes medicine.  · Medicines that increase blood glucose or affect your diabetes control.  · Not eating enough, or not eating often enough.  · Changes in physical activity level.  · Being sick or having an infection.    If you have prediabetes or undiagnosed diabetes:  · Hyperglycemia may be caused by those conditions.    If you do not have diabetes, hyperglycemia may be caused by:  · Certain medicines, including steroid medicines, beta-blockers, epinephrine, and thiazide diuretics.  · Stress.  · Serious illness.  · Surgery.  · Diseases of the pancreas.  · Infection.    What increases the risk?  Hyperglycemia is more likely to develop in people who have risk factors for diabetes, such as:  · Having a family member with diabetes.  · Having a gene for type 1 diabetes that is passed from parent to child (inherited).  · Living in an area with cold weather conditions.  · Exposure to certain viruses.  · Certain conditions in which the body's disease-fighting (immune) system attacks itself (autoimmune disorders).  · Being overweight or obese.  · Having an  inactive (sedentary) lifestyle.  · Having been diagnosed with insulin resistance.  · Having a history of prediabetes, gestational diabetes, or polycystic ovarian syndrome (PCOS).  · Being of American-Indian, African-American, Hispanic/Latino, or Asian/Pacific Islander descent.    What are the signs or symptoms?  Hyperglycemia may not cause any symptoms. If you do have symptoms, they may include early warning signs, such as:  · Increased thirst.  · Hunger.  · Feeling very tired.  · Needing to urinate more often than usual.  · Blurry vision.    Other symptoms may develop if hyperglycemia gets worse, such as:  · Dry mouth.  · Loss of appetite.  · Fruity-smelling breath.  · Weakness.  · Unexpected or rapid weight gain or weight loss.  · Tingling or numbness in the hands or feet.  · Headache.  · Skin that does not quickly return to normal after being lightly pinched and released (poor skin turgor).  · Abdominal pain.  · Cuts or bruises that are slow to heal.    How is this diagnosed?  Hyperglycemia is diagnosed with a blood test to measure your blood glucose level. This blood test is usually done while you are having symptoms. Your health care provider may also do a physical exam and review your medical history.  You may have more tests to determine the cause of your hyperglycemia, such as:  · A fasting blood glucose (FBG) test. You will not be allowed to eat (you will fast) for at least 8 hours before a blood sample is   taken.  · An A1c (hemoglobin A1c) blood test. This provides information about blood glucose control over the previous 2-3 months.  · An oral glucose tolerance test (OGTT). This measures your blood glucose at two times:  ? After fasting. This is your baseline blood glucose level.  ? Two hours after drinking a beverage that contains glucose.    How is this treated?  Treatment depends on the cause of your hyperglycemia. Treatment may include:  · Taking medicine to regulate your blood glucose levels. If you  take insulin or other diabetes medicines, your medicine or dosage may be adjusted.  · Lifestyle changes, such as exercising more, eating healthier foods, or losing weight.  · Treating an illness or infection, if this caused your hyperglycemia.  · Checking your blood glucose more often.  · Stopping or reducing steroid medicines, if these caused your hyperglycemia.    If your hyperglycemia becomes severe and it results in hyperglycemic hyperosmolar state, you must be hospitalized and given IV fluids.  Follow these instructions at home:  General instructions   · Take over-the-counter and prescription medicines only as told by your health care provider.  · Do not use any products that contain nicotine or tobacco, such as cigarettes and e-cigarettes. If you need help quitting, ask your health care provider.  · Limit alcohol intake to no more than 1 drink per day for nonpregnant women and 2 drinks per day for men. One drink equals 12 oz of beer, 5 oz of wine, or 1½ oz of hard liquor.  · Learn to manage stress. If you need help with this, ask your health care provider.  · Keep all follow-up visits as told by your health care provider. This is important.  Eating and drinking   · Maintain a healthy weight.  · Exercise regularly, as directed by your health care provider.  · Stay hydrated, especially when you exercise, get sick, or spend time in hot temperatures.  · Eat healthy foods, such as:  ? Lean proteins.  ? Complex carbohydrates.  ? Fresh fruits and vegetables.  ? Low-fat dairy products.  ? Healthy fats.  · Drink enough fluid to keep your urine clear or pale yellow.  If you have diabetes:     · Make sure you know the symptoms of hyperglycemia.  · Follow your diabetes management plan, as told by your health care provider. Make sure you:  ? Take your insulin and medicines as directed.  ? Follow your exercise plan.  ? Follow your meal plan. Eat on time, and do not skip meals.  ? Check your blood glucose as often as  directed. Make sure to check your blood glucose before and after exercise. If you exercise longer or in a different way than usual, check your blood glucose more often.  ? Follow your sick day plan whenever you cannot eat or drink normally. Make this plan in advance with your health care provider.  · Share your diabetes management plan with people in your workplace, school, and household.  · Check your urine for ketones when you are ill and as told by your health care provider.  · Carry a medical alert card or wear medical alert jewelry.  Contact a health care provider if:  · Your blood glucose is at or above 240 mg/dL (13.3 mmol/L) for 2 days in a row.  · You have problems keeping your blood glucose in your target range.  · You have frequent episodes of hyperglycemia.    Get help right away if:  · You have difficulty breathing.  · You have a change in how you think, feel, or act (mental status).  · You have nausea or vomiting that does not go away.  These symptoms may represent a serious problem that is an emergency. Do not wait to see if the symptoms will go away. Get medical help right away. Call your local emergency services (911 in the [place] Do not drive yourself to the hospital.  Summary  · Hyperglycemia occurs when the level of sugar (glucose) in the blood is too high.  · Hyperglycemia is diagnosed with a blood test to measure your blood glucose level. This blood test is usually done while you are having symptoms. Your health care provider may also do a physical exam and review your medical history.  · If you have diabetes, follow your diabetes management plan as told by your health care provider.  · Contact your health care provider if you have problems keeping your blood glucose in your target range.  This information is not intended to replace advice given to you by your health care provider. Make sure you discuss any questions you have with your health care provider.  Document Released: 11/17/2000 Document  Revised: 02/09/2016 Document Reviewed: 02/09/2016  Elsevier Interactive Patient Education © 2017 [employer]

## 2016-07-01 NOTE — Care Management Note (Signed)
Case Management Note  Patient Details  Name: Austin Torres MRN: [id number] Date of Birth: 1934/05/27  Subjective/Objective:                    Action/Plan: Pt discharged home with self care. No f/u per PT/OT and no DME needs. Pt with insurance and PCP. Pt has transportation home.   Expected Discharge Date:  07/01/16               Expected Discharge Plan:  Home/Self Care  In-House Referral:     Discharge planning Services     Post Acute Care Choice:    Choice offered to:     DME Arranged:    DME Agency:     HH Arranged:    HH Agency:     Status of Service:  Completed, signed off  If discussed at [employer] of Stay Meetings, dates discussed:    Additional Comments:  Pollie Friar, RN 07/01/2016, 10:37 AM

## 2016-07-01 NOTE — Discharge Summary (Addendum)
Physician Discharge Summary  Austin Torres [id number] DOB: Dec 24, 1933 DOA: 06/29/2016  PCP: Austin Kehr, MD  Admit date: 06/29/2016 Discharge date: 07/01/2016   Recommendations for Outpatient Follow-Up:   1. Lfts, flp 6 weeks 2. Follow up HgbA1C 3. tobacco cessation 4. Repeat U/A to ensure hematuria resolved   Discharge Diagnosis:   Principal Problem:   Acute ischemic stroke [facility]) Active Problems:   Essential hypertension   Hematuria   Stroke (cerebrum) ([place])   Stroke [facility])   Discharge disposition:  Home.  Discharge Condition: Improved.  Diet recommendation: Low sodium, heart healthy.  Carbohydrate-modified  Wound care: None.   History of Present Illness:   Austin Torres is a 83 y.o. male with history of hypertension, tobacco abuse presents to the ER because of right upper extremity numbness. Patient's symptoms started on 06/28/2016 with right upper extremity numbness which worsened yesterday. Denies any weakness of the extremities. Denies any visual symptoms difficulty speaking or swallowing. Patient thought his symptoms were secondary to shoveling snow last week. Since symptoms progressed patient came to the ER. MRI of the brain shows left thalamic stroke. Neurologist on-call was consulted. Patient is being admitted for further stroke workup. In addition patient also noticed some hematuria last 3 days. Hematuria happens only on starting urination. Then the urine becomes clear. Denies any dysuria fever or chills.     Hospital Course by Problem:   Stroke:  Dominant  infarct secondary to small vessel disease.  Resultant  No residual deficits  MRI - Punctate 4 mm focus of diffusion abnormality within the left thalamus.  MRA - normal posterior circulation. Moderate stenosis right MCA bifurcation.  Carotid Doppler - 123456 LICA and 123456 RICA stenosis  2D Echo -LVEF 60-65%, moderate LVH, normal wall motion, diastolic    dysfunction with indeterminate LV  filling pressure, aortic valve    sclerosis with trivial AI, trivial MR, normal LA size, mild RAE,  trivial TR, RVSP 20 mmHg, normal IVC.  LDL - 139- added pravastatin at patient request  HgbA1c - 6.2-- spoke regarding carb mod diet  aspirin 81 mg daily prior to admission, now on clopidogrel 75 mg daily   Hypertension  Stable  Hyperlipidemia  Home meds:  No lipid lowering medications prior to admission  LDL 139, goal < 70  pravastatin  Continue statin at discharge  UTI with hematuria -culture pending at time of d/c -keflex PO x 7 days total  Medical Consultants:    Neuro   Discharge Exam:   Vitals:   07/01/16 0100 07/01/16 0457  BP: (!) 157/103 (!) 149/82  Pulse: (!) 58 64  Resp: 20 18  Temp: 97.9 F (36.6 C) 97.9 F (36.6 C)   Vitals:   06/30/16 1819 06/30/16 2123 07/01/16 0100 07/01/16 0457  BP: (!) 148/77 (!) 174/81 (!) 157/103 (!) 149/82  Pulse: (!) 59 (!) 56 (!) 58 64  Resp: 19 18 20 18   Temp: 97.9 F (36.6 C) 97.9 F (36.6 C) 97.9 F (36.6 C) 97.9 F (36.6 C)  TempSrc: Oral Oral Oral Oral  SpO2: 97% 97% 95% 97%  Weight:      Height:        Gen:  NAD    The results of significant diagnostics from this hospitalization (including imaging, microbiology, ancillary and laboratory) are listed below for reference.     Procedures and Diagnostic Studies:   Ct Head Wo Contrast  Result Date: 06/29/2016 CLINICAL DATA:  83 year old male with history of right-sided weakness this morning  at 9 a.m., which has since resolved. Right arm numbness and weakness. EXAM: CT HEAD WITHOUT CONTRAST TECHNIQUE: Contiguous axial images were obtained from the base of the skull through the vertex without intravenous contrast. COMPARISON:  No priors. FINDINGS: Brain: Mild cerebral atrophy. Patchy and confluent areas of decreased attenuation are noted throughout the deep and periventricular white matter of the cerebral hemispheres bilaterally, compatible with chronic  microvascular ischemic disease. Physiologic calcifications in the basal ganglia bilaterally. No evidence of acute infarction, hemorrhage, hydrocephalus, extra-axial collection or mass lesion/mass effect. Vascular: No hyperdense vessel or unexpected calcification. Skull: Normal. Negative for fracture or focal lesion. Sinuses/Orbits: Extensive mucosal thickening in the maxillary sinuses bilaterally (left greater than right). Mild multifocal mucosal thickening in the ethmoid sinuses bilaterally. No air-fluid levels are noted in the paranasal sinuses. Other: None. IMPRESSION: 1. No acute intracranial abnormalities. 2. Mild cerebral atrophy with extensive chronic microvascular ischemic changes in the cerebral white matter, as above. Electronically Signed   By: Vinnie Langton M.D.   On: 06/29/2016 19:43   Mr Brain Wo Contrast  Result Date: 06/29/2016 CLINICAL DATA:  Initial evaluation for acute right arm numbness, evaluate for stroke. The EXAM: MRI HEAD WITHOUT CONTRAST TECHNIQUE: Multiplanar, multiecho pulse sequences of the brain and surrounding structures were obtained without intravenous contrast. COMPARISON:  Comparison made with prior head CT from earlier the same day. FINDINGS: Brain: Diffuse prominence of the CSF containing spaces is compatible with generalized cerebral atrophy, moderate in nature. Patchy and confluent T2/FLAIR hyperintensity within the periventricular and deep white matter both cerebral hemispheres most consistent with chronic microvascular disease, also moderate in nature. Few scattered superimposed small remote lacunar infarcts noted within the bilateral basal ganglia. There is a single punctate 4 mm focus of diffusion abnormality within the lateral left thalamus (series 4, image 21), suspicious for possible tiny acute/early subacute ischemic infarct. Corresponding signal abnormality difficult to discern on ADC map given small size. No evidence for associated hemorrhage. No other evidence  for acute or subacute ischemia. No made of a subcentimeter focus of susceptibility artifact within the inferior left cerebellar hemisphere, compatible with a small chronic microhemorrhage. This is likely hypertensive in etiology. No other evidence for chronic hemorrhage. No mass lesion, midline shift or mass effect. No hydrocephalus. No extra-axial fluid collection. Major dural sinuses are patent. Pituitary gland and suprasellar region within normal limits. Vascular: Major intracranial vascular flow voids are maintained. Skull and upper cervical spine: Craniocervical junction normal. Visualized upper cervical spine unremarkable. Bone marrow signal intensity within normal limits. No scalp soft tissue abnormality. Sinuses/Orbits: Globes and orbits within normal limits. Patient is status post lens extraction bilaterally. Moderate mucosal thickening throughout the paranasal sinuses, greatest within the ethmoidal air cells and maxillary sinuses. No air-fluid level to suggest active sinus infection. Small left mastoid effusion noted. Inner ear structures grossly normal. IMPRESSION: 1. Punctate 4 mm focus of diffusion abnormality within the left thalamus, suspicious for possible tiny acute/ early subacute ischemic infarct. No associated hemorrhage. 2. No other acute intracranial process identified. 3. Moderate age-related cerebral atrophy with chronic microvascular ischemic disease. Electronically Signed   By: Jeannine Boga M.D.   On: 06/29/2016 22:47   Mr Jodene Nam Head/brain [id number] Cm  Result Date: 06/30/2016 CLINICAL DATA:  Stroke EXAM: MRA HEAD WITHOUT CONTRAST TECHNIQUE: Angiographic images of the Circle of Willis were obtained using MRA technique without intravenous contrast. COMPARISON:  MRI head 06/29/2016 FINDINGS: Both vertebral arteries widely patent. PICA patent bilaterally. Basilar widely patent. AICA, superior cerebellar, posterior cerebral  arteries widely patent without stenosis. Cavernous carotid widely  patent bilaterally without stenosis. Moderate stenosis right MCA bifurcation. Left middle cerebral artery widely patent. Negative for cerebral aneurysm. IMPRESSION: Normal posterior circulation Moderate stenosis right MCA bifurcation. Electronically Signed   By: Franchot Gallo M.D.   On: 06/30/2016 08:48     Labs:   Basic Metabolic Panel:  Recent Labs Lab 06/29/16 1830 06/29/16 1918 06/30/16 0321  NA 137 139 141  K 3.7 3.7 3.7  CL 103 103 105  CO2 23  --  25  GLUCOSE 119* 120* 147*  BUN 14 17 14   CREATININE 1.14 1.30* 1.15  CALCIUM 9.1  --  9.0   GFR Estimated Creatinine Clearance: 51.2 mL/min (by C-G formula based on SCr of 1.15 mg/dL). Liver Function Tests:  Recent Labs Lab 06/29/16 1830 06/30/16 0321  AST 21 24  ALT 17 17  ALKPHOS 51 53  BILITOT 0.6 0.7  PROT 6.7 6.2*  ALBUMIN 4.0 3.7   No results for input(s): LIPASE, AMYLASE in the last 168 hours. No results for input(s): AMMONIA in the last 168 hours. Coagulation profile  Recent Labs Lab 06/29/16 1830  INR 1.04    CBC:  Recent Labs Lab 06/29/16 1830 06/29/16 1918 06/30/16 0321  WBC 8.3  --  7.8  NEUTROABS 4.7  --  4.6  HGB 13.6 12.9* 13.5  HCT 39.9 38.0* 41.0  MCV 90.9  --  90.5  PLT 169  --  152   Cardiac Enzymes: No results for input(s): CKTOTAL, CKMB, CKMBINDEX, TROPONINI in the last 168 hours. BNP: Invalid input(s): POCBNP CBG: No results for input(s): GLUCAP in the last 168 hours. D-Dimer No results for input(s): DDIMER in the last 72 hours. Hgb A1c  Recent Labs  06/30/16 0321  HGBA1C 6.2*   Lipid Profile  Recent Labs  06/30/16 0321  CHOL 214*  HDL 47  LDLCALC 139*  TRIG 140  CHOLHDL 4.6   Thyroid function studies No results for input(s): TSH, T4TOTAL, T3FREE, THYROIDAB in the last 72 hours.  Invalid input(s): FREET3 Anemia work up No results for input(s): VITAMINB12, FOLATE, FERRITIN, TIBC, IRON, RETICCTPCT in the last 72 hours. Microbiology Recent Results (from  the past 240 hour(s))  Culture, Urine     Status: Abnormal   Collection Time: 06/30/16  2:37 AM  Result Value Ref Range Status   Specimen Description URINE, RANDOM  Final   Special Requests NONE  Final   Culture <10,000 COLONIES/mL INSIGNIFICANT GROWTH (A)  Final   Report Status 07/01/2016 FINAL  Final     Discharge Instructions:   Discharge Instructions    Ambulatory referral to Neurology    Complete by:  As directed    An appointment is requested in approximately: 6 weeks   Diet - low sodium heart healthy    Complete by:  As directed    Diet Carb Modified    Complete by:  As directed    Discharge instructions    Complete by:  As directed    Stop smoking   Increase activity slowly    Complete by:  As directed      Allergies as of 07/01/2016      Reactions   Cardura [doxazosin Mesylate] Other (See Comments)   dizziness      Medication List    STOP taking these medications   BABY ASPIRIN PO     TAKE these medications   alfuzosin 10 MG 24 hr tablet Commonly known as:  UROXATRAL Take  1 tablet (10 mg total) by mouth daily with breakfast.   atenolol 50 MG tablet Commonly known as:  TENORMIN Take 1 tablet (50 mg total) by mouth 2 (two) times daily.   cephALEXin 500 MG capsule Commonly known as:  KEFLEX Take 1 capsule (500 mg total) by mouth 2 (two) times daily.   clopidogrel 75 MG tablet Commonly known as:  PLAVIX Take 1 tablet (75 mg total) by mouth daily.   diphenoxylate-atropine 2.5-0.025 MG tablet Commonly known as:  LOMOTIL Take 1 tablet by mouth 4 (four) times daily as needed for diarrhea or loose stools.   losartan 100 MG tablet Commonly known as:  COZAAR Take 1 tablet (100 mg total) by mouth daily.   omeprazole 20 MG capsule Commonly known as:  PRILOSEC Take 2 capsules (40 mg total) by mouth daily. What changed:  how much to take   pravastatin 40 MG tablet Commonly known as:  PRAVACHOL Take 1 tablet (40 mg total) by mouth daily.   traMADol 50  MG tablet Commonly known as:  ULTRAM TAKE 1/2-1 TABLET BY MOUTH EVERY 12 HOURS AS NEEDED FOR SEVERE PAIN   Vitamin B-12 1000 MCG Subl Place 1 tablet (1,000 mcg total) under the tongue daily.      Follow-up Information    Austin Kehr, MD Follow up.   Specialty:  Internal Medicine Contact information: [address] [place] [address] [phone]            Time coordinating discharge: 35 min  Signed:  Jaleigha Deane U Latice Waitman   [facility] 07/01/2016, 1:26 PM

## 2016-07-16 ENCOUNTER — Encounter: Payer: Self-pay | Admitting: Internal Medicine

## 2016-07-16 ENCOUNTER — Telehealth: Payer: Self-pay | Admitting: Internal Medicine

## 2016-07-16 ENCOUNTER — Ambulatory Visit (INDEPENDENT_AMBULATORY_CARE_PROVIDER_SITE_OTHER): Payer: Medicare Other | Admitting: Internal Medicine

## 2016-07-16 VITALS — BP 140/80 | HR 54 | Resp 20 | Wt 178.2 lb

## 2016-07-16 DIAGNOSIS — E559 Vitamin D deficiency, unspecified: Secondary | ICD-10-CM | POA: Diagnosis not present

## 2016-07-16 DIAGNOSIS — E785 Hyperlipidemia, unspecified: Secondary | ICD-10-CM

## 2016-07-16 DIAGNOSIS — I10 Essential (primary) hypertension: Secondary | ICD-10-CM | POA: Diagnosis not present

## 2016-07-16 DIAGNOSIS — E538 Deficiency of other specified B group vitamins: Secondary | ICD-10-CM | POA: Diagnosis not present

## 2016-07-16 DIAGNOSIS — I63512 Cerebral infarction due to unspecified occlusion or stenosis of left middle cerebral artery: Secondary | ICD-10-CM | POA: Diagnosis not present

## 2016-07-16 MED ORDER — ESCITALOPRAM OXALATE 5 MG PO TABS: 5.0000 mg | ORAL_TABLET | Freq: Every day | ORAL | 5 refills | Status: DC

## 2016-07-16 NOTE — Progress Notes (Signed)
Subjective:  Patient ID: Austin Torres, male    DOB: [birth year]  Age: 83 y.o. MRN: [id number]  CC: No chief complaint on file.   HPI Austin Torres presents for CVA - L arm is weak. F/u HTN, PVD Hosp stay for CVA 1/23-1/25/18 reviewed  Stroke: Dominant infarct secondary to small vessel disease.  Resultant No residual deficits  MRI- Punctate 4 mm focus of diffusion abnormality within the left thalamus.  MRA- normal posterior circulation. Moderate stenosis right MCA bifurcation.  CarotidDoppler- 123456 LICA and 123456 RICA stenosis  2D Echo-LVEF 60-65%, moderate LVH, normal wall motion, diastolic             dysfunction with indeterminate LV filling pressure, aortic valve             sclerosis with trivial AI, trivial MR, normal LA size, mild RAE, trivial TR, RVSP 20 mmHg, normal IVC.  LDL- 139- added pravastatin at patient request  HgbA1c- 6.2-- spoke regarding carb mod diet  aspirin 81 mg dailyprior to admission, now on clopidogrel 75 mg daily   Outpatient Medications Prior to Visit  Medication Sig Dispense Refill  . alfuzosin (UROXATRAL) 10 MG 24 hr tablet Take 1 tablet (10 mg total) by mouth daily with breakfast. 90 tablet 3  . atenolol (TENORMIN) 50 MG tablet Take 1 tablet (50 mg total) by mouth 2 (two) times daily. 180 tablet 3  . BABY ASPIRIN PO Take 81 mg by mouth daily.     . cephALEXin (KEFLEX) 500 MG capsule Take 1 capsule (500 mg total) by mouth 2 (two) times daily. 10 capsule 0  . clopidogrel (PLAVIX) 75 MG tablet Take 1 tablet (75 mg total) by mouth daily. 30 tablet 0  . Cyanocobalamin (VITAMIN B-12) 1000 MCG SUBL Place 1 tablet (1,000 mcg total) under the tongue daily. 100 tablet 3  . diphenoxylate-atropine (LOMOTIL) 2.5-0.025 MG tablet Take 1 tablet by mouth 4 (four) times daily as needed for diarrhea or loose stools. 60 tablet 0  . losartan (COZAAR) 100 MG tablet Take 1 tablet (100 mg total) by mouth daily. 90 tablet 3  . omeprazole  (PRILOSEC) 20 MG capsule Take 2 capsules (40 mg total) by mouth daily. (Patient taking differently: Take 20 mg by mouth daily. ) 180 capsule 3  . pravastatin (PRAVACHOL) 40 MG tablet Take 1 tablet (40 mg total) by mouth daily. 30 tablet 0  . traMADol (ULTRAM) 50 MG tablet TAKE 1/2-1 TABLET BY MOUTH EVERY 12 HOURS AS NEEDED FOR SEVERE PAIN 60 tablet 3   No facility-administered medications prior to visit.     ROS Review of Systems  Constitutional: Negative for appetite change, fatigue and unexpected weight change.  HENT: Negative for congestion, nosebleeds, sneezing, sore throat and trouble swallowing.   Eyes: Negative for itching and visual disturbance.  Respiratory: Negative for cough.   Cardiovascular: Negative for chest pain, palpitations and leg swelling.  Gastrointestinal: Negative for abdominal distention, blood in stool, diarrhea and nausea.  Genitourinary: Negative for frequency and hematuria.  Musculoskeletal: Negative for back pain, gait problem, joint swelling and neck pain.  Skin: Negative for rash.  Neurological: Positive for weakness and numbness. Negative for dizziness, tremors and speech difficulty.  Psychiatric/Behavioral: Negative for agitation, dysphoric mood and sleep disturbance. The patient is not nervous/anxious.     Objective:  BP 140/80   Pulse (!) 54   Resp 20   Wt 178 lb 4 oz (80.9 kg)   SpO2 98%   BMI 26.32 kg/m  BP Readings from Last 3 Encounters:  07/16/16 140/80  07/01/16 (!) 149/82  05/26/16 140/80    Wt Readings from Last 3 Encounters:  07/16/16 178 lb 4 oz (80.9 kg)  06/30/16 178 lb 12.8 oz (81.1 kg)  05/26/16 179 lb (81.2 kg)    Physical Exam  Constitutional: He is oriented to person, place, and time. He appears well-developed. No distress.  NAD  HENT:  Mouth/Throat: Oropharynx is clear and moist.  Eyes: Conjunctivae are normal. Pupils are equal, round, and reactive to light.  Neck: Normal range of motion. No JVD present. No  thyromegaly present.  Cardiovascular: Normal rate, regular rhythm, normal heart sounds and intact distal pulses.  Exam reveals no gallop and no friction rub.   No murmur heard. Pulmonary/Chest: Effort normal and breath sounds normal. No respiratory distress. He has no wheezes. He has no rales. He exhibits no tenderness.  Abdominal: Soft. Bowel sounds are normal. He exhibits no distension and no mass. There is no tenderness. There is no rebound and no guarding.  Musculoskeletal: Normal range of motion. He exhibits no edema or tenderness.  Lymphadenopathy:    He has no cervical adenopathy.  Neurological: He is alert and oriented to person, place, and time. He has normal reflexes. No cranial nerve deficit. He exhibits normal muscle tone. He displays a negative Romberg sign. Coordination and gait normal.  Skin: Skin is warm and dry. No rash noted.  Psychiatric: His behavior is normal. Judgment and thought content normal.    Lab Results  Component Value Date   WBC 7.8 06/30/2016   HGB 13.5 06/30/2016   HCT 41.0 06/30/2016   PLT 152 06/30/2016   GLUCOSE 147 (H) 06/30/2016   CHOL 214 (H) 06/30/2016   TRIG 140 06/30/2016   HDL 47 06/30/2016   LDLDIRECT 155.3 02/15/2012   LDLCALC 139 (H) 06/30/2016   ALT 17 06/30/2016   AST 24 06/30/2016   NA 141 06/30/2016   K 3.7 06/30/2016   CL 105 06/30/2016   CREATININE 1.15 06/30/2016   BUN 14 06/30/2016   CO2 25 06/30/2016   TSH 2.43 04/01/2016   PSA 2.55 04/01/2016   INR 1.04 06/29/2016   HGBA1C 6.2 (H) 06/30/2016    Ct Head Wo Contrast  Result Date: 06/29/2016 CLINICAL DATA:  83 year old male with history of right-sided weakness this morning at 9 a.m., which has since resolved. Right arm numbness and weakness. EXAM: CT HEAD WITHOUT CONTRAST TECHNIQUE: Contiguous axial images were obtained from the base of the skull through the vertex without intravenous contrast. COMPARISON:  No priors. FINDINGS: Brain: Mild cerebral atrophy. Patchy and  confluent areas of decreased attenuation are noted throughout the deep and periventricular white matter of the cerebral hemispheres bilaterally, compatible with chronic microvascular ischemic disease. Physiologic calcifications in the basal ganglia bilaterally. No evidence of acute infarction, hemorrhage, hydrocephalus, extra-axial collection or mass lesion/mass effect. Vascular: No hyperdense vessel or unexpected calcification. Skull: Normal. Negative for fracture or focal lesion. Sinuses/Orbits: Extensive mucosal thickening in the maxillary sinuses bilaterally (left greater than right). Mild multifocal mucosal thickening in the ethmoid sinuses bilaterally. No air-fluid levels are noted in the paranasal sinuses. Other: None. IMPRESSION: 1. No acute intracranial abnormalities. 2. Mild cerebral atrophy with extensive chronic microvascular ischemic changes in the cerebral white matter, as above. Electronically Signed   By: Vinnie Langton M.D.   On: 06/29/2016 19:43   Mr Brain Wo Contrast  Result Date: 06/29/2016 CLINICAL DATA:  Initial evaluation for acute right arm numbness, evaluate  for stroke. The EXAM: MRI HEAD WITHOUT CONTRAST TECHNIQUE: Multiplanar, multiecho pulse sequences of the brain and surrounding structures were obtained without intravenous contrast. COMPARISON:  Comparison made with prior head CT from earlier the same day. FINDINGS: Brain: Diffuse prominence of the CSF containing spaces is compatible with generalized cerebral atrophy, moderate in nature. Patchy and confluent T2/FLAIR hyperintensity within the periventricular and deep white matter both cerebral hemispheres most consistent with chronic microvascular disease, also moderate in nature. Few scattered superimposed small remote lacunar infarcts noted within the bilateral basal ganglia. There is a single punctate 4 mm focus of diffusion abnormality within the lateral left thalamus (series 4, image 21), suspicious for possible tiny  acute/early subacute ischemic infarct. Corresponding signal abnormality difficult to discern on ADC map given small size. No evidence for associated hemorrhage. No other evidence for acute or subacute ischemia. No made of a subcentimeter focus of susceptibility artifact within the inferior left cerebellar hemisphere, compatible with a small chronic microhemorrhage. This is likely hypertensive in etiology. No other evidence for chronic hemorrhage. No mass lesion, midline shift or mass effect. No hydrocephalus. No extra-axial fluid collection. Major dural sinuses are patent. Pituitary gland and suprasellar region within normal limits. Vascular: Major intracranial vascular flow voids are maintained. Skull and upper cervical spine: Craniocervical junction normal. Visualized upper cervical spine unremarkable. Bone marrow signal intensity within normal limits. No scalp soft tissue abnormality. Sinuses/Orbits: Globes and orbits within normal limits. Patient is status post lens extraction bilaterally. Moderate mucosal thickening throughout the paranasal sinuses, greatest within the ethmoidal air cells and maxillary sinuses. No air-fluid level to suggest active sinus infection. Small left mastoid effusion noted. Inner ear structures grossly normal. IMPRESSION: 1. Punctate 4 mm focus of diffusion abnormality within the left thalamus, suspicious for possible tiny acute/ early subacute ischemic infarct. No associated hemorrhage. 2. No other acute intracranial process identified. 3. Moderate age-related cerebral atrophy with chronic microvascular ischemic disease. Electronically Signed   By: Jeannine Boga M.D.   On: 06/29/2016 22:47   Mr Jodene Nam Head/brain [id number] Cm  Result Date: 06/30/2016 CLINICAL DATA:  Stroke EXAM: MRA HEAD WITHOUT CONTRAST TECHNIQUE: Angiographic images of the Circle of Willis were obtained using MRA technique without intravenous contrast. COMPARISON:  MRI head 06/29/2016 FINDINGS: Both vertebral  arteries widely patent. PICA patent bilaterally. Basilar widely patent. AICA, superior cerebellar, posterior cerebral arteries widely patent without stenosis. Cavernous carotid widely patent bilaterally without stenosis. Moderate stenosis right MCA bifurcation. Left middle cerebral artery widely patent. Negative for cerebral aneurysm. IMPRESSION: Normal posterior circulation Moderate stenosis right MCA bifurcation. Electronically Signed   By: Franchot Gallo M.D.   On: 06/30/2016 08:48    Assessment & Plan:   There are no diagnoses linked to this encounter. I am having Mr. Deruiter maintain his BABY ASPIRIN PO, Vitamin B-12, diphenoxylate-atropine, omeprazole, losartan, atenolol, alfuzosin, traMADol, clopidogrel, pravastatin, and cephALEXin.  No orders of the defined types were placed in this encounter.    Follow-up: No Follow-up on file.  Walker Kehr, MD

## 2016-07-16 NOTE — Assessment & Plan Note (Signed)
On Atenolol 

## 2016-07-16 NOTE — Telephone Encounter (Signed)
Talked with patient's wife---dr plotnikov is seeing patient today at 11:30

## 2016-07-16 NOTE — Assessment & Plan Note (Signed)
Plavix, ASA, Pravachol BP meds

## 2016-07-16 NOTE — Telephone Encounter (Signed)
Spoke with pt and wife after speaking to [facility]. Pt only wants to see Dr Camila Li. Informed the pt that it would be wise to be re-evaluated due to a recent stroke.

## 2016-07-16 NOTE — Assessment & Plan Note (Signed)
On Vit D 

## 2016-07-16 NOTE — Telephone Encounter (Signed)
Pt called asking to see if Dr. Camila Li could work him in today due to pain on right side. Pt has appt with [facility] for hospital follow up front stroke. Please advise, pt stated this symptom is similar to when he has stroke but said he just need to be check out and also transfer to [facility] to speak to triage as well. Please advise, ok to work him in today?

## 2016-07-16 NOTE — Telephone Encounter (Signed)
Patient Name: Austin Torres DOB: [birth year] Initial Comment Caller states he had a stroke, two weeks ago. Having the same symptoms again. Right arm is feeling numb again. Not feeling right. Nurse Assessment Nurse: Tamala Julian, RN, Joelene Millin Date/Time (Eastern Time): 07/16/2016 10:03:41 AM Confirm and document reason for call. If symptomatic, describe symptoms. ---Caller states he had a stroke two weeks ago. Having the same symptoms again. Right arm is feeling numb again. Not feeling right. Just started this morning. Does the patient have any new or worsening symptoms? ---Yes Will a triage be completed? ---Yes Related visit to physician within the last 2 weeks? ---Yes Does the PT have any chronic conditions? (i.e. diabetes, asthma, etc.) ---Yes List chronic conditions. ---Recent stroke Is this a behavioral health or substance abuse call? ---No Guidelines Guideline Title Affirmed Question Affirmed Notes Neurologic Deficit [1] Numbness (i.e., loss of sensation) of the face, arm / hand, or leg / foot on one side of the body AND [2] sudden onset AND [3] present now Final Disposition User Call EMS 911 Now Tamala Julian, RN, [place] office backline per client protocol and gave patient's name, DOB, 911 outcome, protocol used, and call-back number. Office stated someone would contact patient. Referrals GO TO FACILITY REFUSED Disagree/Comply: Disagree Disagree/Comply Reason: Disagree with instructions

## 2016-07-16 NOTE — Progress Notes (Signed)
Pre visit review using our clinic review tool, if applicable. No additional management support is needed unless otherwise documented below in the visit note. 

## 2016-07-16 NOTE — Assessment & Plan Note (Signed)
On B12 

## 2016-07-20 ENCOUNTER — Ambulatory Visit: Payer: Medicare Other | Admitting: Internal Medicine

## 2016-07-20 ENCOUNTER — Inpatient Hospital Stay: Payer: Medicare Other | Admitting: Internal Medicine

## 2016-07-29 ENCOUNTER — Other Ambulatory Visit (INDEPENDENT_AMBULATORY_CARE_PROVIDER_SITE_OTHER): Payer: Medicare Other

## 2016-07-29 DIAGNOSIS — E785 Hyperlipidemia, unspecified: Secondary | ICD-10-CM | POA: Diagnosis not present

## 2016-07-29 DIAGNOSIS — E538 Deficiency of other specified B group vitamins: Secondary | ICD-10-CM

## 2016-07-29 DIAGNOSIS — I10 Essential (primary) hypertension: Secondary | ICD-10-CM

## 2016-07-29 DIAGNOSIS — I63512 Cerebral infarction due to unspecified occlusion or stenosis of left middle cerebral artery: Secondary | ICD-10-CM

## 2016-07-29 LAB — LIPID PANEL
CHOL/HDL RATIO: 4
Cholesterol: 171 mg/dL (ref 0–200)
HDL: 44.7 mg/dL (ref >39.00)
LDL CALC: 96 mg/dL (ref 0–99)
NONHDL: 125.97
TRIGLYCERIDES: 150 mg/dL — AB (ref 0.0–149.0)
VLDL: 30 mg/dL (ref 0.0–40.0)

## 2016-07-29 LAB — BASIC METABOLIC PANEL
BUN: 14 mg/dL (ref 6–23)
CHLORIDE: 103 meq/L (ref 96–112)
CO2: 29 meq/L (ref 19–32)
Calcium: 9.4 mg/dL (ref 8.4–10.5)
Creatinine, Ser: 1.12 mg/dL (ref 0.40–1.50)
GFR: 66.97 mL/min (ref >60.00)
GLUCOSE: 90 mg/dL (ref 70–99)
Potassium: 4.2 mEq/L (ref 3.5–5.1)
SODIUM: 138 meq/L (ref 135–145)

## 2016-07-29 LAB — URINALYSIS
BILIRUBIN URINE: NEGATIVE
Hgb urine dipstick: NEGATIVE
KETONES UR: NEGATIVE
Leukocytes, UA: NEGATIVE
Nitrite: NEGATIVE
PH: 6 (ref 5.0–8.0)
TOTAL PROTEIN, URINE-UPE24: NEGATIVE
URINE GLUCOSE: NEGATIVE
Urobilinogen, UA: 0.2 (ref 0.0–1.0)

## 2016-07-30 ENCOUNTER — Encounter: Payer: Self-pay | Admitting: Internal Medicine

## 2016-07-30 ENCOUNTER — Ambulatory Visit (INDEPENDENT_AMBULATORY_CARE_PROVIDER_SITE_OTHER): Payer: Medicare Other | Admitting: Internal Medicine

## 2016-07-30 DIAGNOSIS — I10 Essential (primary) hypertension: Secondary | ICD-10-CM

## 2016-07-30 DIAGNOSIS — E291 Testicular hypofunction: Secondary | ICD-10-CM | POA: Diagnosis not present

## 2016-07-30 DIAGNOSIS — R911 Solitary pulmonary nodule: Secondary | ICD-10-CM | POA: Diagnosis not present

## 2016-07-30 DIAGNOSIS — I63512 Cerebral infarction due to unspecified occlusion or stenosis of left middle cerebral artery: Secondary | ICD-10-CM

## 2016-07-30 DIAGNOSIS — R310 Gross hematuria: Secondary | ICD-10-CM | POA: Diagnosis not present

## 2016-07-30 DIAGNOSIS — E538 Deficiency of other specified B group vitamins: Secondary | ICD-10-CM

## 2016-07-30 LAB — VITAMIN B12: Vitamin B-12: 683 pg/mL (ref 211–911)

## 2016-07-30 MED ORDER — PANTOPRAZOLE SODIUM 40 MG PO TBEC: 40.0000 mg | DELAYED_RELEASE_TABLET | Freq: Every day | ORAL | 11 refills | Status: DC

## 2016-07-30 MED ORDER — ESCITALOPRAM OXALATE 10 MG PO TABS: 10.0000 mg | ORAL_TABLET | Freq: Every day | ORAL | 5 refills | Status: DC

## 2016-07-30 MED ORDER — CLOPIDOGREL BISULFATE 75 MG PO TABS: 75.0000 mg | ORAL_TABLET | Freq: Every day | ORAL | 11 refills | Status: DC

## 2016-07-30 MED ORDER — PRAVASTATIN SODIUM 40 MG PO TABS: 40.0000 mg | ORAL_TABLET | Freq: Every day | ORAL | 11 refills | Status: DC

## 2016-07-30 MED ORDER — TRAMADOL HCL 50 MG PO TABS: ORAL_TABLET | ORAL | 3 refills | Status: DC

## 2016-07-30 NOTE — Assessment & Plan Note (Signed)
Plavix, ASA, Pravachol, Atenolol

## 2016-07-30 NOTE — Progress Notes (Signed)
Subjective:  Patient ID: Austin Torres, male    DOB: 23-Jul-1933  Age: 83 y.o. MRN: [id number]  CC: Follow-up (CVA , understand his situation, stress )   HPI Austin Torres presents for HTN, hematuria f/u  Outpatient Medications Prior to Visit  Medication Sig Dispense Refill  . alfuzosin (UROXATRAL) 10 MG 24 hr tablet Take 1 tablet (10 mg total) by mouth daily with breakfast. 90 tablet 3  . atenolol (TENORMIN) 50 MG tablet Take 1 tablet (50 mg total) by mouth 2 (two) times daily. 180 tablet 3  . BABY ASPIRIN PO Take 81 mg by mouth daily.     . clopidogrel (PLAVIX) 75 MG tablet Take 1 tablet (75 mg total) by mouth daily. 30 tablet 0  . Cyanocobalamin (VITAMIN B-12) 1000 MCG SUBL Place 1 tablet (1,000 mcg total) under the tongue daily. 100 tablet 3  . diphenoxylate-atropine (LOMOTIL) 2.5-0.025 MG tablet Take 1 tablet by mouth 4 (four) times daily as needed for diarrhea or loose stools. 60 tablet 0  . escitalopram (LEXAPRO) 5 MG tablet Take 1 tablet (5 mg total) by mouth daily. 30 tablet 5  . losartan (COZAAR) 100 MG tablet Take 1 tablet (100 mg total) by mouth daily. 90 tablet 3  . omeprazole (PRILOSEC) 20 MG capsule Take 2 capsules (40 mg total) by mouth daily. (Patient taking differently: Take 20 mg by mouth daily. ) 180 capsule 3  . pravastatin (PRAVACHOL) 40 MG tablet Take 1 tablet (40 mg total) by mouth daily. 30 tablet 0  . traMADol (ULTRAM) 50 MG tablet TAKE 1/2-1 TABLET BY MOUTH EVERY 12 HOURS AS NEEDED FOR SEVERE PAIN 60 tablet 3   No facility-administered medications prior to visit.     ROS Review of Systems  Constitutional: Negative for appetite change, fatigue and unexpected weight change.  HENT: Negative for congestion, nosebleeds, sneezing, sore throat and trouble swallowing.   Eyes: Negative for itching and visual disturbance.  Respiratory: Negative for cough.   Cardiovascular: Negative for chest pain, palpitations and leg swelling.  Gastrointestinal: Negative for  abdominal distention, blood in stool, diarrhea and nausea.  Genitourinary: Negative for frequency and hematuria.  Musculoskeletal: Negative for back pain, gait problem, joint swelling and neck pain.  Skin: Negative for rash.  Neurological: Negative for dizziness, tremors, speech difficulty and weakness.  Psychiatric/Behavioral: Negative for agitation, dysphoric mood, sleep disturbance and suicidal ideas. The patient is nervous/anxious.     Objective:  BP 130/80   Pulse (!) 56   Temp 98.7 F (37.1 C) (Oral)   Resp 16   Ht 5\' 7"  (1.702 m)   Wt 179 lb 8 oz (81.4 kg)   SpO2 98%   BMI 28.11 kg/m   BP Readings from Last 3 Encounters:  07/30/16 130/80  07/16/16 140/80  07/01/16 (!) 149/82    Wt Readings from Last 3 Encounters:  07/30/16 179 lb 8 oz (81.4 kg)  07/16/16 178 lb 4 oz (80.9 kg)  06/30/16 178 lb 12.8 oz (81.1 kg)    Physical Exam  Constitutional: He is oriented to person, place, and time. He appears well-developed. No distress.  NAD  HENT:  Mouth/Throat: Oropharynx is clear and moist.  Eyes: Conjunctivae are normal. Pupils are equal, round, and reactive to light.  Neck: Normal range of motion. No JVD present. No thyromegaly present.  Cardiovascular: Normal rate, regular rhythm, normal heart sounds and intact distal pulses.  Exam reveals no gallop and no friction rub.   No murmur heard. Pulmonary/Chest: Effort  normal and breath sounds normal. No respiratory distress. He has no wheezes. He has no rales. He exhibits no tenderness.  Abdominal: Soft. Bowel sounds are normal. He exhibits no distension and no mass. There is no tenderness. There is no rebound and no guarding.  Musculoskeletal: Normal range of motion. He exhibits no edema or tenderness.  Lymphadenopathy:    He has no cervical adenopathy.  Neurological: He is alert and oriented to person, place, and time. He has normal reflexes. No cranial nerve deficit. He exhibits normal muscle tone. He displays a negative  Romberg sign. Coordination and gait normal.  Skin: Skin is warm and dry. No rash noted.  Psychiatric: He has a normal mood and affect. His behavior is normal. Judgment and thought content normal.    Lab Results  Component Value Date   WBC 7.8 06/30/2016   HGB 13.5 06/30/2016   HCT 41.0 06/30/2016   PLT 152 06/30/2016   GLUCOSE 90 07/29/2016   CHOL 171 07/29/2016   TRIG 150.0 (H) 07/29/2016   HDL 44.70 07/29/2016   LDLDIRECT 155.3 02/15/2012   LDLCALC 96 07/29/2016   ALT 17 06/30/2016   AST 24 06/30/2016   NA 138 07/29/2016   K 4.2 07/29/2016   CL 103 07/29/2016   CREATININE 1.12 07/29/2016   BUN 14 07/29/2016   CO2 29 07/29/2016   TSH 2.43 04/01/2016   PSA 2.55 04/01/2016   INR 1.04 06/29/2016   HGBA1C 6.2 (H) 06/30/2016    Ct Head Wo Contrast  Result Date: 06/29/2016 CLINICAL DATA:  83 year old male with history of right-sided weakness this morning at 9 a.m., which has since resolved. Right arm numbness and weakness. EXAM: CT HEAD WITHOUT CONTRAST TECHNIQUE: Contiguous axial images were obtained from the base of the skull through the vertex without intravenous contrast. COMPARISON:  No priors. FINDINGS: Brain: Mild cerebral atrophy. Patchy and confluent areas of decreased attenuation are noted throughout the deep and periventricular white matter of the cerebral hemispheres bilaterally, compatible with chronic microvascular ischemic disease. Physiologic calcifications in the basal ganglia bilaterally. No evidence of acute infarction, hemorrhage, hydrocephalus, extra-axial collection or mass lesion/mass effect. Vascular: No hyperdense vessel or unexpected calcification. Skull: Normal. Negative for fracture or focal lesion. Sinuses/Orbits: Extensive mucosal thickening in the maxillary sinuses bilaterally (left greater than right). Mild multifocal mucosal thickening in the ethmoid sinuses bilaterally. No air-fluid levels are noted in the paranasal sinuses. Other: None. IMPRESSION: 1. No  acute intracranial abnormalities. 2. Mild cerebral atrophy with extensive chronic microvascular ischemic changes in the cerebral white matter, as above. Electronically Signed   By: Vinnie Langton M.D.   On: 06/29/2016 19:43   Mr Brain Wo Contrast  Result Date: 06/29/2016 CLINICAL DATA:  Initial evaluation for acute right arm numbness, evaluate for stroke. The EXAM: MRI HEAD WITHOUT CONTRAST TECHNIQUE: Multiplanar, multiecho pulse sequences of the brain and surrounding structures were obtained without intravenous contrast. COMPARISON:  Comparison made with prior head CT from earlier the same day. FINDINGS: Brain: Diffuse prominence of the CSF containing spaces is compatible with generalized cerebral atrophy, moderate in nature. Patchy and confluent T2/FLAIR hyperintensity within the periventricular and deep white matter both cerebral hemispheres most consistent with chronic microvascular disease, also moderate in nature. Few scattered superimposed small remote lacunar infarcts noted within the bilateral basal ganglia. There is a single punctate 4 mm focus of diffusion abnormality within the lateral left thalamus (series 4, image 21), suspicious for possible tiny acute/early subacute ischemic infarct. Corresponding signal abnormality difficult to discern  on ADC map given small size. No evidence for associated hemorrhage. No other evidence for acute or subacute ischemia. No made of a subcentimeter focus of susceptibility artifact within the inferior left cerebellar hemisphere, compatible with a small chronic microhemorrhage. This is likely hypertensive in etiology. No other evidence for chronic hemorrhage. No mass lesion, midline shift or mass effect. No hydrocephalus. No extra-axial fluid collection. Major dural sinuses are patent. Pituitary gland and suprasellar region within normal limits. Vascular: Major intracranial vascular flow voids are maintained. Skull and upper cervical spine: Craniocervical junction  normal. Visualized upper cervical spine unremarkable. Bone marrow signal intensity within normal limits. No scalp soft tissue abnormality. Sinuses/Orbits: Globes and orbits within normal limits. Patient is status post lens extraction bilaterally. Moderate mucosal thickening throughout the paranasal sinuses, greatest within the ethmoidal air cells and maxillary sinuses. No air-fluid level to suggest active sinus infection. Small left mastoid effusion noted. Inner ear structures grossly normal. IMPRESSION: 1. Punctate 4 mm focus of diffusion abnormality within the left thalamus, suspicious for possible tiny acute/ early subacute ischemic infarct. No associated hemorrhage. 2. No other acute intracranial process identified. 3. Moderate age-related cerebral atrophy with chronic microvascular ischemic disease. Electronically Signed   By: Jeannine Boga M.D.   On: 06/29/2016 22:47   Mr Jodene Nam Head/brain [id number] Cm  Result Date: 06/30/2016 CLINICAL DATA:  Stroke EXAM: MRA HEAD WITHOUT CONTRAST TECHNIQUE: Angiographic images of the Circle of Willis were obtained using MRA technique without intravenous contrast. COMPARISON:  MRI head 06/29/2016 FINDINGS: Both vertebral arteries widely patent. PICA patent bilaterally. Basilar widely patent. AICA, superior cerebellar, posterior cerebral arteries widely patent without stenosis. Cavernous carotid widely patent bilaterally without stenosis. Moderate stenosis right MCA bifurcation. Left middle cerebral artery widely patent. Negative for cerebral aneurysm. IMPRESSION: Normal posterior circulation Moderate stenosis right MCA bifurcation. Electronically Signed   By: Franchot Gallo M.D.   On: 06/30/2016 08:48    Assessment & Plan:   There are no diagnoses linked to this encounter. I am having Mr. Vold maintain his BABY ASPIRIN PO, Vitamin B-12, diphenoxylate-atropine, omeprazole, losartan, atenolol, alfuzosin, traMADol, clopidogrel, pravastatin, and escitalopram.  No orders  of the defined types were placed in this encounter.    Follow-up: No Follow-up on file.  Walker Kehr, MD

## 2016-07-30 NOTE — Assessment & Plan Note (Signed)
Lifestyle changes

## 2016-07-30 NOTE — Progress Notes (Signed)
Pre-visit discussion using our clinic review tool. No additional management support is needed unless otherwise documented below in the visit note.  

## 2016-07-30 NOTE — Assessment & Plan Note (Signed)
On B12 

## 2016-07-30 NOTE — Assessment & Plan Note (Signed)
12/17 pt had a CT, declined cystoscopy

## 2016-07-30 NOTE — Assessment & Plan Note (Signed)
Chest CT 

## 2016-07-30 NOTE — Assessment & Plan Note (Signed)
On Atenolol 

## 2016-07-30 NOTE — Patient Instructions (Signed)

## 2016-08-02 ENCOUNTER — Encounter: Payer: Self-pay | Admitting: Internal Medicine

## 2016-08-05 ENCOUNTER — Inpatient Hospital Stay: Admission: RE | Admit: 2016-08-05 | Payer: Medicare Other | Source: Ambulatory Visit

## 2016-09-21 ENCOUNTER — Other Ambulatory Visit: Payer: Self-pay | Admitting: *Deleted

## 2016-09-21 MED ORDER — PRAVASTATIN SODIUM 40 MG PO TABS: 40.0000 mg | ORAL_TABLET | Freq: Every day | ORAL | 2 refills | Status: DC

## 2016-10-04 ENCOUNTER — Ambulatory Visit (INDEPENDENT_AMBULATORY_CARE_PROVIDER_SITE_OTHER)
Admission: RE | Admit: 2016-10-04 | Discharge: 2016-10-04 | Disposition: A | Discharge status: Home / Self Care | Payer: Medicare Other | Source: Ambulatory Visit | Attending: Internal Medicine | Admitting: Internal Medicine

## 2016-10-04 DIAGNOSIS — R911 Solitary pulmonary nodule: Secondary | ICD-10-CM

## 2016-10-05 ENCOUNTER — Other Ambulatory Visit: Payer: Self-pay | Admitting: Internal Medicine

## 2016-10-05 DIAGNOSIS — I2510 Atherosclerotic heart disease of native coronary artery without angina pectoris: Secondary | ICD-10-CM

## 2016-10-07 NOTE — Progress Notes (Signed)
Cardiology Office Note   Date:  10/08/2016   ID:  Austin Torres, DOB 1934/04/23, MRN [id number]  PCP:  Walker Kehr, MD  Cardiologist:   Jenkins Rouge, MD   Chief Complaint  Patient presents with  . Establish Care      History of Present Illness: Austin Torres is a 83 y.o. male who presents for evaluation of CAD. Referred by Dr Laurian Brim.  Reviewed his CT done 4/30/8 for f/u of a lung nodule  Noted to have "diffuse coronary artery disease, aortic atherosclerosis."  Calcium most dense in LM/proximal LAD and proximal RCA  CRF;s include elevated lipids and HTN.  06/29/16 had right sided weakness and stroke Rx with plavix  MRI with small ischemic punctate 4 mm focus in left thalamus.   Echo done as part of stroke w/u reviewed EF 60-65% Grade one diastolic Trivial AR/MR   Legs a bit weaker since stroke. Quit smoking about a year ago. 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 B12 deficiency   . BPH (benign prostatic hypertrophy)   . GERD (gastroesophageal reflux disease)   . Hyperlipidemia   . Hypertension   . Hypogonadism male   . Skin cancer     Past Surgical History:  Procedure Laterality Date  . APPENDECTOMY    . EYE SURGERY     glaucoma procedure -both eyes  . skin cancer excision     scalp  . TONSILLECTOMY       Current Outpatient Prescriptions  Medication Sig Dispense Refill  . alfuzosin (UROXATRAL) 10 MG 24 hr tablet Take 1 tablet (10 mg total) by mouth daily with breakfast. 90 tablet 3  . atenolol (TENORMIN) 50 MG tablet Take 1 tablet (50 mg total) by mouth 2 (two) times daily. 180 tablet 3  . clopidogrel (PLAVIX) 75 MG tablet Take 1 tablet (75 mg total) by mouth daily. 30 tablet 11  . Cyanocobalamin (VITAMIN B-12) 1000 MCG SUBL Place 1 tablet (1,000 mcg total) under the tongue daily. 100 tablet 3  . diphenoxylate-atropine (LOMOTIL) 2.5-0.025 MG tablet Take 1  tablet by mouth 4 (four) times daily as needed for diarrhea or loose stools. 60 tablet 0  . escitalopram (LEXAPRO) 10 MG tablet Take 1 tablet (10 mg total) by mouth daily. 30 tablet 5  . losartan (COZAAR) 100 MG tablet Take 1 tablet (100 mg total) by mouth daily. 90 tablet 3  . pantoprazole (PROTONIX) 40 MG tablet Take 1 tablet (40 mg total) by mouth daily. 30 tablet 11  . pravastatin (PRAVACHOL) 40 MG tablet Take 1 tablet (40 mg total) by mouth daily. 90 tablet 2  . traMADol (ULTRAM) 50 MG tablet TAKE 1/2-1 TABLET BY MOUTH EVERY 12 HOURS AS NEEDED FOR SEVERE PAIN 60 tablet 3   No current facility-administered medications for this visit.     Allergies:   Cardura [doxazosin mesylate]    Social History:  The patient  reports that he has been smoking Cigars.  He has a 25.00 pack-year smoking history. He has never used smokeless tobacco. He reports that he drinks about 9.0 oz of alcohol per week . He reports that he does not use drugs.   Family History:  The patient's family history includes Heart disease in his brother and father.    ROS:  Please see the history of present illness.   Otherwise, review of systems are positive  for none.   All other systems are reviewed and negative.    PHYSICAL EXAM: VS:  BP (!) 150/66   Pulse (!) 56   Ht 5' 6.5" (1.689 m)   Wt 179 lb 6.4 oz (81.4 kg)   SpO2 98%   BMI 28.52 kg/m  , BMI Body mass index is 28.52 kg/m. Affect appropriate Healthy:  appears stated age 53: normal Neck supple with no adenopathy JVP normal no bruits no thyromegaly Lungs clear with no wheezing and good diaphragmatic motion Heart:  S1/S2 no murmur, no rub, gallop or click PMI normal Abdomen: benighn, BS positve, no tenderness, no AAA no bruit.  No HSM or HJR Distal pulses intact with no bruits No edema Neuro non-focal Skin warm and dry No muscular weakness Dupetryns contracture right hand     EKG:  SR LAD RBBB rate 56    Recent Labs: 04/01/2016: TSH  2.43 06/30/2016: ALT 17; Hemoglobin 13.5; Platelets 152 07/29/2016: BUN 14; Creatinine, Ser 1.12; Potassium 4.2; Sodium 138    Lipid Panel    Component Value Date/Time   CHOL 171 07/29/2016 1032   TRIG 150.0 (H) 07/29/2016 1032   HDL 44.70 07/29/2016 1032   CHOLHDL 4 07/29/2016 1032   VLDL 30.0 07/29/2016 1032   LDLCALC 96 07/29/2016 1032   LDLDIRECT 155.3 02/15/2012 1048      Wt Readings from Last 3 Encounters:  10/08/16 179 lb 6.4 oz (81.4 kg)  07/30/16 179 lb 8 oz (81.4 kg)  07/16/16 178 lb 4 oz (80.9 kg)      Other studies Reviewed: Additional studies/ records that were reviewed today include: Notes primary CT labs and hospital w/u During stroke with MRI/MRA .    ASSESSMENT AND PLAN:  1.  [id number] on CT but see this in octogenarians. Reasonable to do exercise myovue to r/o ischemia  Asa  2. HTN Well controlled.  Continue current medications and low sodium Dash type diet.   3. Cholesterol continue statin  Lab Results  Component Value Date   LDLCALC 96 07/29/2016    4. Lung nodule  Stable by recent CT has stopped smoking  5. CVA small vessel disease not embolic carotids ok plavix per neuro   Current medicines are reviewed at length with the patient today.  The patient does not have concerns regarding medicines.  The following changes have been made:  no change  Labs/ tests ordered today include: Ex Myovue   Orders Placed This Encounter  Procedures  . Myocardial Perfusion Imaging     Disposition:   FU with me in a year      Signed, Jenkins Rouge, MD  10/08/2016 10:48 AM    [facility] [address], [place], [place]  [address] Phone: [phone]; Fax: [phone]

## 2016-10-08 ENCOUNTER — Ambulatory Visit (INDEPENDENT_AMBULATORY_CARE_PROVIDER_SITE_OTHER): Payer: Medicare Other | Admitting: Cardiovascular Disease

## 2016-10-08 ENCOUNTER — Encounter: Payer: Self-pay | Admitting: Cardiovascular Disease

## 2016-10-08 VITALS — BP 150/66 | HR 56 | Ht 66.5 in | Wt 179.4 lb

## 2016-10-08 DIAGNOSIS — I25119 Atherosclerotic heart disease of native coronary artery with unspecified angina pectoris: Secondary | ICD-10-CM

## 2016-10-08 DIAGNOSIS — Z7689 Persons encountering health services in other specified circumstances: Secondary | ICD-10-CM

## 2016-10-08 NOTE — Patient Instructions (Signed)

## 2016-10-11 ENCOUNTER — Telehealth (HOSPITAL_COMMUNITY): Payer: Self-pay

## 2016-10-11 ENCOUNTER — Other Ambulatory Visit: Payer: Self-pay | Admitting: Internal Medicine

## 2016-10-11 DIAGNOSIS — W57XXXA Bitten or stung by nonvenomous insect and other nonvenomous arthropods, initial encounter: Secondary | ICD-10-CM

## 2016-10-11 NOTE — Telephone Encounter (Signed)
Rec'd call pt states over the weekend he was bit by a tick. Pharmacist recommend that he get started on doxycycline for lyme prophylaxis...Austin Torres

## 2016-10-11 NOTE — Telephone Encounter (Signed)
No need for Doxy unless he has a rash Thx

## 2016-10-11 NOTE — Telephone Encounter (Signed)
Patient given detailed instructions per Myocardial Perfusion Study Information Sheet for the test on 10/13/16 at 0745. Patient notified to arrive 15 minutes early and that it is imperative to arrive on time for appointment to keep from having the test rescheduled.  If you need to cancel or reschedule your appointment, please call the office within 24 hours of your appointment. Failure to do so may result in a cancellation of your appointment, and a $50 no show fee. Patient verbalized understanding. Janifer Adie, CNMT, RT-N

## 2016-10-12 ENCOUNTER — Telehealth: Payer: Self-pay | Admitting: Internal Medicine

## 2016-10-12 NOTE — Telephone Encounter (Signed)
Called pt no answer left MD response on vm.../lmb 

## 2016-10-12 NOTE — Telephone Encounter (Signed)
Pt called in and said that he does now have a rash from the tick bite.  He wants Dr Camila Li to send meds to [place]

## 2016-10-12 NOTE — Telephone Encounter (Signed)
please advise.

## 2016-10-13 ENCOUNTER — Ambulatory Visit (HOSPITAL_COMMUNITY): Payer: Medicare Other | Attending: Cardiovascular Disease

## 2016-10-13 DIAGNOSIS — I25119 Atherosclerotic heart disease of native coronary artery with unspecified angina pectoris: Secondary | ICD-10-CM | POA: Insufficient documentation

## 2016-10-13 LAB — MYOCARDIAL PERFUSION IMAGING
CHL CUP NUCLEAR SDS: 5
CHL CUP RESTING HR STRESS: 52 {beats}/min
LHR: 0.31
LVDIAVOL: 119 mL (ref 62–150)
LVSYSVOL: 48 mL
NUC STRESS TID: 0.89
Peak HR: 71 {beats}/min
SRS: 1
SSS: 6

## 2016-10-13 MED ORDER — TECHNETIUM TC 99M TETROFOSMIN IV KIT
32.8000 | PACK | Freq: Once | INTRAVENOUS | Status: AC | PRN
  Administered 2016-10-13: 32.8 via INTRAVENOUS
  Filled 2016-10-13: qty 33

## 2016-10-13 MED ORDER — REGADENOSON 0.4 MG/5ML IV SOLN
0.4000 mg | Freq: Once | INTRAVENOUS | Status: AC
  Administered 2016-10-13: 0.4 mg via INTRAVENOUS

## 2016-10-13 MED ORDER — DOXYCYCLINE HYCLATE 100 MG PO TABS: 100.0000 mg | ORAL_TABLET | Freq: Two times a day (BID) | ORAL | 0 refills | Status: DC

## 2016-10-13 MED ORDER — TECHNETIUM TC 99M TETROFOSMIN IV KIT
10.6000 | PACK | Freq: Once | INTRAVENOUS | Status: AC | PRN
  Administered 2016-10-13: 10.6 via INTRAVENOUS
  Filled 2016-10-13: qty 11

## 2016-10-13 NOTE — Telephone Encounter (Signed)
Ok Thx 

## 2016-12-03 ENCOUNTER — Ambulatory Visit: Payer: Medicare Other | Admitting: Cardiovascular Disease

## 2016-12-24 ENCOUNTER — Ambulatory Visit (INDEPENDENT_AMBULATORY_CARE_PROVIDER_SITE_OTHER): Payer: Medicare Other | Admitting: Internal Medicine

## 2016-12-24 ENCOUNTER — Encounter: Payer: Self-pay | Admitting: Internal Medicine

## 2016-12-24 ENCOUNTER — Other Ambulatory Visit (INDEPENDENT_AMBULATORY_CARE_PROVIDER_SITE_OTHER): Payer: Medicare Other

## 2016-12-24 DIAGNOSIS — E559 Vitamin D deficiency, unspecified: Secondary | ICD-10-CM | POA: Diagnosis not present

## 2016-12-24 DIAGNOSIS — R7309 Other abnormal glucose: Secondary | ICD-10-CM | POA: Insufficient documentation

## 2016-12-24 DIAGNOSIS — E538 Deficiency of other specified B group vitamins: Secondary | ICD-10-CM

## 2016-12-24 DIAGNOSIS — R5383 Other fatigue: Secondary | ICD-10-CM

## 2016-12-24 DIAGNOSIS — I10 Essential (primary) hypertension: Secondary | ICD-10-CM

## 2016-12-24 LAB — BASIC METABOLIC PANEL
BUN: 23 mg/dL (ref 6–23)
CHLORIDE: 102 meq/L (ref 96–112)
CO2: 28 mEq/L (ref 19–32)
CREATININE: 1.23 mg/dL (ref 0.40–1.50)
Calcium: 9.4 mg/dL (ref 8.4–10.5)
GFR: 60.05 mL/min (ref >60.00)
GLUCOSE: 102 mg/dL — AB (ref 70–99)
POTASSIUM: 4.3 meq/L (ref 3.5–5.1)
Sodium: 138 mEq/L (ref 135–145)

## 2016-12-24 LAB — URINALYSIS
Bilirubin Urine: NEGATIVE
Hgb urine dipstick: NEGATIVE
Ketones, ur: NEGATIVE
LEUKOCYTES UA: NEGATIVE
NITRITE: NEGATIVE
PH: 6 (ref 5.0–8.0)
SPECIFIC GRAVITY, URINE: 1.02 (ref 1.000–1.030)
TOTAL PROTEIN, URINE-UPE24: NEGATIVE
Urine Glucose: NEGATIVE
Urobilinogen, UA: 0.2 (ref 0.0–1.0)

## 2016-12-24 LAB — CBC WITH DIFFERENTIAL/PLATELET
BASOS PCT: 0.9 % (ref 0.0–3.0)
Basophils Absolute: 0.1 10*3/uL (ref 0.0–0.1)
EOS ABS: 0.4 10*3/uL (ref 0.0–0.7)
Eosinophils Relative: 3.9 % (ref 0.0–5.0)
HEMATOCRIT: 43.2 % (ref 39.0–52.0)
HEMOGLOBIN: 14.7 g/dL (ref 13.0–17.0)
Lymphocytes Relative: 24.7 % (ref 12.0–46.0)
Lymphs Abs: 2.2 10*3/uL (ref 0.7–4.0)
MCHC: 33.9 g/dL (ref 30.0–36.0)
MCV: 91.7 fl (ref 78.0–100.0)
MONOS PCT: 9.1 % (ref 3.0–12.0)
Monocytes Absolute: 0.8 10*3/uL (ref 0.1–1.0)
NEUTROS ABS: 5.6 10*3/uL (ref 1.4–7.7)
Neutrophils Relative %: 61.4 % (ref 43.0–77.0)
Platelets: 160 10*3/uL (ref 150.0–400.0)
RBC: 4.71 Mil/uL (ref 4.22–5.81)
RDW: 14.3 % (ref 11.5–15.5)
WBC: 9.1 10*3/uL (ref 4.0–10.5)

## 2016-12-24 LAB — TSH: TSH: 4.09 u[IU]/mL (ref 0.35–4.50)

## 2016-12-24 LAB — HEMOGLOBIN A1C: Hgb A1c MFr Bld: 6.4 % (ref 4.6–6.5)

## 2016-12-24 MED ORDER — TRIAMCINOLONE ACETONIDE 0.1 % EX OINT: 1.0000 "application " | TOPICAL_OINTMENT | Freq: Two times a day (BID) | CUTANEOUS | 0 refills | Status: AC

## 2016-12-24 NOTE — Assessment & Plan Note (Signed)
On Vit D 

## 2016-12-24 NOTE — Assessment & Plan Note (Signed)
Stress test, CT - OK Labs; r/o DM

## 2016-12-24 NOTE — Progress Notes (Signed)
Subjective:  Patient ID: Austin Torres, male    DOB: [birth year]  Age: 83 y.o. MRN: [id number]  CC: No chief complaint on file.   HPI Austin Torres presents for fatigue and SOB (not new) - Cherlynn Kaiser is seeing Dr Melvyn Novas next week C/o phlegm in am's in his the mornings and GERD -s controlled w/Protonix C/o fatigue - gets tired a lot quicker now C/o peeing a lot...  Outpatient Medications Prior to Visit  Medication Sig Dispense Refill  . alfuzosin (UROXATRAL) 10 MG 24 hr tablet Take 1 tablet (10 mg total) by mouth daily with breakfast. 90 tablet 3  . atenolol (TENORMIN) 50 MG tablet Take 1 tablet (50 mg total) by mouth 2 (two) times daily. 180 tablet 3  . clopidogrel (PLAVIX) 75 MG tablet Take 1 tablet (75 mg total) by mouth daily. 30 tablet 11  . Cyanocobalamin (VITAMIN B-12) 1000 MCG SUBL Place 1 tablet (1,000 mcg total) under the tongue daily. 100 tablet 3  . diphenoxylate-atropine (LOMOTIL) 2.5-0.025 MG tablet Take 1 tablet by mouth 4 (four) times daily as needed for diarrhea or loose stools. 60 tablet 0  . doxycycline (VIBRA-TABS) 100 MG tablet Take 1 tablet (100 mg total) by mouth 2 (two) times daily. 20 tablet 0  . escitalopram (LEXAPRO) 10 MG tablet Take 1 tablet (10 mg total) by mouth daily. 30 tablet 5  . losartan (COZAAR) 100 MG tablet Take 1 tablet (100 mg total) by mouth daily. 90 tablet 3  . pantoprazole (PROTONIX) 40 MG tablet Take 1 tablet (40 mg total) by mouth daily. 30 tablet 11  . pravastatin (PRAVACHOL) 40 MG tablet Take 1 tablet (40 mg total) by mouth daily. 90 tablet 2  . traMADol (ULTRAM) 50 MG tablet TAKE 1/2-1 TABLET BY MOUTH EVERY 12 HOURS AS NEEDED FOR SEVERE PAIN 60 tablet 3   No facility-administered medications prior to visit.     ROS Review of Systems  Constitutional: Positive for fatigue. Negative for appetite change and unexpected weight change.  HENT: Negative for congestion, nosebleeds, sneezing, sore throat and trouble swallowing.   Eyes: Negative for  itching and visual disturbance.  Respiratory: Negative for cough.   Cardiovascular: Negative for chest pain, palpitations and leg swelling.  Gastrointestinal: Negative for abdominal distention, blood in stool, diarrhea and nausea.  Genitourinary: Positive for frequency. Negative for hematuria.  Musculoskeletal: Negative for back pain, gait problem, joint swelling and neck pain.  Skin: Positive for rash.  Neurological: Negative for dizziness, tremors, speech difficulty and weakness.  Psychiatric/Behavioral: Negative for agitation, dysphoric mood and sleep disturbance. The patient is not nervous/anxious.     Objective:  BP 136/60 (BP Location: Left Arm, Patient Position: Sitting, Cuff Size: Normal)   Pulse (!) 53   Temp 98.2 F (36.8 C) (Oral)   Ht 5' 6.5" (1.689 m)   Wt 180 lb (81.6 kg)   SpO2 99%   BMI 28.62 kg/m   BP Readings from Last 3 Encounters:  12/24/16 136/60  10/08/16 (!) 150/66  07/30/16 130/80    Wt Readings from Last 3 Encounters:  12/24/16 180 lb (81.6 kg)  10/08/16 179 lb 6.4 oz (81.4 kg)  07/30/16 179 lb 8 oz (81.4 kg)    Physical Exam  Constitutional: He is oriented to person, place, and time. He appears well-developed. No distress.  NAD  HENT:  Mouth/Throat: Oropharynx is clear and moist.  Eyes: Pupils are equal, round, and reactive to light. Conjunctivae are normal.  Neck: Normal range of motion.  No JVD present. No thyromegaly present.  Cardiovascular: Normal rate, regular rhythm, normal heart sounds and intact distal pulses.  Exam reveals no gallop and no friction rub.   No murmur heard. Pulmonary/Chest: Effort normal and breath sounds normal. No respiratory distress. He has no wheezes. He has no rales. He exhibits no tenderness.  Abdominal: Soft. Bowel sounds are normal. He exhibits no distension and no mass. There is no tenderness. There is no rebound and no guarding.  Musculoskeletal: Normal range of motion. He exhibits no edema or tenderness.    Lymphadenopathy:    He has no cervical adenopathy.  Neurological: He is alert and oriented to person, place, and time. He has normal reflexes. No cranial nerve deficit. He exhibits normal muscle tone. He displays a negative Romberg sign. Coordination and gait normal.  Skin: Skin is warm and dry. Rash noted.  Psychiatric: He has a normal mood and affect. His behavior is normal. Judgment and thought content normal.  obese Bulging R biceps head Rash on face from shaving  Lab Results  Component Value Date   WBC 7.8 06/30/2016   HGB 13.5 06/30/2016   HCT 41.0 06/30/2016   PLT 152 06/30/2016   GLUCOSE 90 07/29/2016   CHOL 171 07/29/2016   TRIG 150.0 (H) 07/29/2016   HDL 44.70 07/29/2016   LDLDIRECT 155.3 02/15/2012   LDLCALC 96 07/29/2016   ALT 17 06/30/2016   AST 24 06/30/2016   NA 138 07/29/2016   K 4.2 07/29/2016   CL 103 07/29/2016   CREATININE 1.12 07/29/2016   BUN 14 07/29/2016   CO2 29 07/29/2016   TSH 2.43 04/01/2016   PSA 2.55 04/01/2016   INR 1.04 06/29/2016   HGBA1C 6.2 (H) 06/30/2016    Ct Chest Wo Contrast  Result Date: 10/04/2016 CLINICAL DATA:  Follow-up right lower lobe nodule EXAM: CT CHEST WITHOUT CONTRAST TECHNIQUE: Multidetector CT imaging of the chest was performed following the standard protocol without IV contrast. COMPARISON:  04/05/2016 and 08/28/2014 CT abdomen FINDINGS: Cardiovascular: Diffuse coronary artery and aortic calcifications. No aneurysm. Heart is normal size. Mediastinum/Nodes: No mediastinal, hilar, or axillary adenopathy. Lungs/Pleura: Right lower lobe pulmonary nodule posteriorly measures 7 x 6 mm on image 95 compared to 8 x 6 mm previously, unchanged. Linear scarring noted medially in the right lower lobe. No new or enlarging pulmonary nodules. No effusions. Upper Abdomen: Imaging into the upper abdomen shows no acute findings. Musculoskeletal: No acute bony abnormality. IMPRESSION: 7 x 6 mm right lower lobe pulmonary nodule posteriorly,  stable. This is stable dating back to March 2016 and compatible with a benign nodule. No additional follow-up necessary. Diffuse coronary artery disease, aortic atherosclerosis. No acute cardiopulmonary disease. Electronically Signed   By: Rolm Baptise M.D.   On: 10/04/2016 10:49    Assessment & Plan:   There are no diagnoses linked to this encounter. I am having Mr. Schappell maintain his Vitamin B-12, diphenoxylate-atropine, losartan, atenolol, alfuzosin, escitalopram, clopidogrel, pantoprazole, traMADol, pravastatin, and doxycycline.  No orders of the defined types were placed in this encounter.    Follow-up: No Follow-up on file.  Walker Kehr, MD

## 2016-12-24 NOTE — Assessment & Plan Note (Signed)
Atenolol

## 2016-12-24 NOTE — Assessment & Plan Note (Signed)
Labs

## 2016-12-24 NOTE — Assessment & Plan Note (Signed)
On B12 

## 2016-12-27 ENCOUNTER — Encounter: Payer: Self-pay | Admitting: Internal Medicine

## 2016-12-27 ENCOUNTER — Ambulatory Visit (INDEPENDENT_AMBULATORY_CARE_PROVIDER_SITE_OTHER): Payer: Medicare Other | Admitting: Internal Medicine

## 2016-12-27 VITALS — BP 134/80 | HR 60 | Ht 66.5 in | Wt 179.0 lb

## 2016-12-27 DIAGNOSIS — J449 Chronic obstructive pulmonary disease, unspecified: Secondary | ICD-10-CM

## 2016-12-27 NOTE — Assessment & Plan Note (Addendum)
PFT's  07/18/2015  FEV1 2.25 (91 % ) ratio 73 with no % improvement from saba p nothing  prior to study with DLCO  71 % corrects to 84 % for alv volume - Spirometry 11/28/2015  FEV1 2.01 (81%)  Ratio 67   - Spirometry 04/02/2016  FEV1 2.13 (85%)  Ratio 68 off all rx   - Spirometry 12/27/2016  FEV1 2.03 (84%)  Ratio 67   - 12/27/2016  Walked RA x 3 laps @ 185 ft each stopped due to  End of study, nl pace, no sob or desat     I had an extended final summary discussion with the patient reviewing all relevant studies completed to date and  lasting 15 to 20 minutes of a 25 minute visit on the following issues:        As I explained to this patient in detail:  although there is milddcopd present, it may not be clinically relevant:   it does not appear to be limiting activity tolerance any more than a set of worn tires limits someone from driving a car  around a parking lot.  A new set of Michelins might look good but would have no perceived impact on the performance of the car and would not be worth the cost.  That is to say:    I don't recommend aggressive pulmonary rx at this point unless limiting symptoms arise or acute exacerbations become as issue, neither of which is the case now.  I asked the patient to contact this office at any time in the future should either of these problems arise.    In terms of his ability to trigger the ignition lock, I have provided him his present spirometry and referred him back to his lawyer to see what else can be done for him but have nothing else to suggest at this point other than breathe clean air

## 2016-12-27 NOTE — Patient Instructions (Signed)
You have mild copd that may affect your ability to trigger your ignition device but is not severe enough to warrant pulmonary medications or follow up

## 2016-12-27 NOTE — Progress Notes (Signed)
Subjective:    Patient ID: Austin Torres, male    DOB: 1933-12-24     MRN: [id number]    Brief patient profile:  72  yowm never regular cigarette smoker from [facility]  mostly cigar smoker with onset sob in the 1990's and much worse since 2012 but not on maint rx referred to pulmonary clinic 11/28/2015 by Dr  Alain Marion for copd eval with GOLD I criteria   History of Present Illness  11/28/2015 1st [facility] office visit/ Lakina Mcintire   Chief Complaint  Patient presents with  . Pulmonary Consult    Referred by Dr. Alain Marion. Pt states here to have spirometry done in order to get form signed for ingnition interlock waiver for the [facility].   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 Pulmonary Consult    Pt states has dx of COPD and unable to get his lisence back b/c he can not blow into to interlock system.   No better with anoro so d/c'd months ago rec You have mild copd that may interfere with your ability to use the ignition lock. If you talk to the [facility] and find additional criteria that would prevent you from using the lock,  I would be happy to look again at your pfts to see whether you meet any of their criteria.    12/27/2016  f/u ov/Rileyann Florance re: cigar smoker with GOLD I criteria  Chief Complaint  Patient presents with  . Follow-up    Pt states having some "rough breathing" since had a stroke 2 months ago. He states "I guess I have it when I go outside".    Sleeps s resp distress / no am cough/ congestion  Only smokes one cigar per month Stroke affected R hand strength 90% but resolved   Really Not limited by breathing from desired activities unless out in heat   No obvious day to day or daytime variability or assoc excess/ purulent  sputum or mucus plugs or hemoptysis or cp or chest tightness, subjective wheeze or overt sinus or hb symptoms. No unusual exp hx or h/o childhood pna/ asthma or knowledge of premature birth.  Sleeping ok without nocturnal  or early am exacerbation  of respiratory  c/o's or need for noct saba. Also denies any obvious fluctuation of symptoms with weather or environmental changes or other aggravating or alleviating factors except as outlined above   Current Medications, Allergies, Complete Past Medical History, Past Surgical History, Family History, and Social History were reviewed in [employer] record.  ROS  The following are not active complaints unless bolded sore throat, dysphagia, dental problems, itching, sneezing,  nasal congestion or excess/ purulent secretions, ear ache,   fever, chills, sweats, unintended wt loss, classically pleuritic or exertional cp,  orthopnea pnd or leg swelling, presyncope, palpitations, abdominal pain, anorexia, nausea, vomiting, diarrhea  or change in bowel or bladder habits, change in stools or urine, dysuria,hematuria,  rash, arthralgias, visual complaints, headache, numbness, weakness/fatigue or ataxia or problems with walking or coordination,  change in mood/affect or memory.  Objective:   Physical Exam  amb wm with hard [place] accent   12/27/2016        179   04/03/2016     176   11/28/15 177 lb 12.8 oz (80.65 kg)  11/28/15 177 lb (80.287 kg)  10/06/15 176 lb (79.833 kg)    Vital signs reviewed - Note on arrival 02 sats  97% on RA    HEENT: nl dentition, turbinates, and oropharynx. Nl external ear canals without cough reflex   NECK :  without JVD/Nodes/TM/ nl carotid upstrokes bilaterally   LUNGS: no acc muscle use,  Nl contour chest with slightly distant bs bilaterally with no audible wheeze/ rhonchi or cough on insp/exp  CV:  RRR  no s3 or murmur or increase in P2, no edema   ABD:  Obese/ soft  and nontender with mid  inspiratory hoover's in the supine position. No bruits or organomegaly, bowel sounds nl  MS:  Nl gait/ ext warm without deformities, calf tenderness, cyanosis or clubbing No obvious joint restrictions   SKIN: warm and dry without lesions    NEURO:  alert, approp, nl sensorium with  no motor deficits      I personally reviewed images and agree with radiology impression as follows:  Chest CT 10/04/16 7 x 6 mm right lower lobe pulmonary nodule posteriorly, stable. This is stable dating back to March 2016 and compatible with a benign nodule. No additional follow-up necessary. My review:  No significant emphysema       Assessment & Plan:

## 2017-02-14 ENCOUNTER — Ambulatory Visit (INDEPENDENT_AMBULATORY_CARE_PROVIDER_SITE_OTHER): Payer: Medicare Other | Admitting: Internal Medicine

## 2017-02-14 ENCOUNTER — Ambulatory Visit (INDEPENDENT_AMBULATORY_CARE_PROVIDER_SITE_OTHER)
Admission: RE | Admit: 2017-02-14 | Discharge: 2017-02-14 | Disposition: A | Discharge status: Home / Self Care | Payer: Medicare Other | Source: Ambulatory Visit | Attending: Internal Medicine | Admitting: Internal Medicine

## 2017-02-14 ENCOUNTER — Encounter: Payer: Self-pay | Admitting: Internal Medicine

## 2017-02-14 ENCOUNTER — Other Ambulatory Visit (INDEPENDENT_AMBULATORY_CARE_PROVIDER_SITE_OTHER): Payer: Medicare Other

## 2017-02-14 DIAGNOSIS — I63512 Cerebral infarction due to unspecified occlusion or stenosis of left middle cerebral artery: Secondary | ICD-10-CM

## 2017-02-14 DIAGNOSIS — I639 Cerebral infarction, unspecified: Secondary | ICD-10-CM | POA: Diagnosis not present

## 2017-02-14 DIAGNOSIS — M5440 Lumbago with sciatica, unspecified side: Secondary | ICD-10-CM

## 2017-02-14 DIAGNOSIS — M545 Low back pain: Secondary | ICD-10-CM | POA: Diagnosis not present

## 2017-02-14 DIAGNOSIS — E538 Deficiency of other specified B group vitamins: Secondary | ICD-10-CM

## 2017-02-14 DIAGNOSIS — E559 Vitamin D deficiency, unspecified: Secondary | ICD-10-CM | POA: Diagnosis not present

## 2017-02-14 DIAGNOSIS — G8929 Other chronic pain: Secondary | ICD-10-CM

## 2017-02-14 DIAGNOSIS — I10 Essential (primary) hypertension: Secondary | ICD-10-CM

## 2017-02-14 LAB — BASIC METABOLIC PANEL
BUN: 14 mg/dL (ref 6–23)
CALCIUM: 9.2 mg/dL (ref 8.4–10.5)
CO2: 26 mEq/L (ref 19–32)
CREATININE: 1.22 mg/dL (ref 0.40–1.50)
Chloride: 100 mEq/L (ref 96–112)
GFR: 60.59 mL/min (ref >60.00)
Glucose, Bld: 107 mg/dL — ABNORMAL HIGH (ref 70–99)
Potassium: 4.1 mEq/L (ref 3.5–5.1)
SODIUM: 135 meq/L (ref 135–145)

## 2017-02-14 LAB — URINALYSIS
BILIRUBIN URINE: NEGATIVE
HGB URINE DIPSTICK: NEGATIVE
KETONES UR: NEGATIVE
LEUKOCYTES UA: NEGATIVE
Nitrite: NEGATIVE
Specific Gravity, Urine: <=1.005 — AB (ref 1.000–1.030)
Total Protein, Urine: NEGATIVE
URINE GLUCOSE: NEGATIVE
UROBILINOGEN UA: 0.2 (ref 0.0–1.0)
pH: 7 (ref 5.0–8.0)

## 2017-02-14 MED ORDER — TRAMADOL HCL 50 MG PO TABS: ORAL_TABLET | ORAL | 3 refills | Status: DC

## 2017-02-14 NOTE — Assessment & Plan Note (Signed)
On Vit D 

## 2017-02-14 NOTE — Assessment & Plan Note (Signed)
Recurrent LBP X ray UA

## 2017-02-14 NOTE — Progress Notes (Signed)
Subjective:  Patient ID: Austin Torres, male    DOB: [birth year]  Age: 83 y.o. MRN: [id number]  CC: No chief complaint on file.   HPI Austin Torres presents for LBP x2 weeks R>L - pain has almost resolved. The pt is worried...  Outpatient Medications Prior to Visit  Medication Sig Dispense Refill  . alfuzosin (UROXATRAL) 10 MG 24 hr tablet Take 1 tablet (10 mg total) by mouth daily with breakfast. 90 tablet 3  . atenolol (TENORMIN) 50 MG tablet Take 1 tablet (50 mg total) by mouth 2 (two) times daily. 180 tablet 3  . clopidogrel (PLAVIX) 75 MG tablet Take 1 tablet (75 mg total) by mouth daily. 30 tablet 11  . Cyanocobalamin (VITAMIN B-12) 1000 MCG SUBL Place 1 tablet (1,000 mcg total) under the tongue daily. 100 tablet 3  . diphenoxylate-atropine (LOMOTIL) 2.5-0.025 MG tablet Take 1 tablet by mouth 4 (four) times daily as needed for diarrhea or loose stools. 60 tablet 0  . doxycycline (VIBRA-TABS) 100 MG tablet Take 1 tablet (100 mg total) by mouth 2 (two) times daily. 20 tablet 0  . escitalopram (LEXAPRO) 10 MG tablet Take 1 tablet (10 mg total) by mouth daily. 30 tablet 5  . losartan (COZAAR) 100 MG tablet Take 1 tablet (100 mg total) by mouth daily. 90 tablet 3  . pantoprazole (PROTONIX) 40 MG tablet Take 1 tablet (40 mg total) by mouth daily. 30 tablet 11  . pravastatin (PRAVACHOL) 40 MG tablet Take 1 tablet (40 mg total) by mouth daily. 90 tablet 2  . traMADol (ULTRAM) 50 MG tablet TAKE 1/2-1 TABLET BY MOUTH EVERY 12 HOURS AS NEEDED FOR SEVERE PAIN 60 tablet 3  . triamcinolone ointment (KENALOG) 0.1 % Apply 1 application topically 2 (two) times daily. 30 g 0   No facility-administered medications prior to visit.     ROS Review of Systems  Constitutional: Negative for appetite change, fatigue and unexpected weight change.  HENT: Negative for congestion, nosebleeds, sneezing, sore throat and trouble swallowing.   Eyes: Negative for itching and visual disturbance.    Respiratory: Negative for cough.   Cardiovascular: Negative for chest pain, palpitations and leg swelling.  Gastrointestinal: Negative for abdominal distention, blood in stool, diarrhea and nausea.  Genitourinary: Negative for frequency and hematuria.  Musculoskeletal: Positive for back pain. Negative for gait problem, joint swelling and neck pain.  Skin: Negative for rash.  Neurological: Negative for dizziness, tremors, speech difficulty and weakness.  Psychiatric/Behavioral: Negative for agitation, dysphoric mood and sleep disturbance. The patient is not nervous/anxious.     Objective:  BP 126/68 (BP Location: Left Arm, Patient Position: Sitting, Cuff Size: Large)   Pulse (!) 52   Temp 98 F (36.7 C) (Oral)   Ht 5' 6.5" (1.689 m)   Wt 181 lb (82.1 kg)   SpO2 99%   BMI 28.78 kg/m   BP Readings from Last 3 Encounters:  02/14/17 126/68  12/27/16 134/80  12/24/16 136/60    Wt Readings from Last 3 Encounters:  02/14/17 181 lb (82.1 kg)  12/27/16 179 lb (81.2 kg)  12/24/16 180 lb (81.6 kg)    Physical Exam  Constitutional: He is oriented to person, place, and time. He appears well-developed. No distress.  NAD  HENT:  Mouth/Throat: Oropharynx is clear and moist.  Eyes: Pupils are equal, round, and reactive to light. Conjunctivae are normal.  Neck: Normal range of motion. No JVD present. No thyromegaly present.  Cardiovascular: Normal rate, regular rhythm, normal  heart sounds and intact distal pulses.  Exam reveals no gallop and no friction rub.   No murmur heard. Pulmonary/Chest: Effort normal and breath sounds normal. No respiratory distress. He has no wheezes. He has no rales. He exhibits no tenderness.  Abdominal: Soft. Bowel sounds are normal. He exhibits no distension and no mass. There is no tenderness. There is no rebound and no guarding.  Musculoskeletal: Normal range of motion. He exhibits tenderness. He exhibits no edema.  Lymphadenopathy:    He has no cervical  adenopathy.  Neurological: He is alert and oriented to person, place, and time. He has normal reflexes. No cranial nerve deficit. He exhibits normal muscle tone. He displays a negative Romberg sign. Coordination and gait normal.  Skin: Skin is warm and dry. No rash noted.  Psychiatric: He has a normal mood and affect. His behavior is normal. Judgment and thought content normal.    Lab Results  Component Value Date   WBC 9.1 12/24/2016   HGB 14.7 12/24/2016   HCT 43.2 12/24/2016   PLT 160.0 12/24/2016   GLUCOSE 102 (H) 12/24/2016   CHOL 171 07/29/2016   TRIG 150.0 (H) 07/29/2016   HDL 44.70 07/29/2016   LDLDIRECT 155.3 02/15/2012   LDLCALC 96 07/29/2016   ALT 17 06/30/2016   AST 24 06/30/2016   NA 138 12/24/2016   K 4.3 12/24/2016   CL 102 12/24/2016   CREATININE 1.23 12/24/2016   BUN 23 12/24/2016   CO2 28 12/24/2016   TSH 4.09 12/24/2016   PSA 2.55 04/01/2016   INR 1.04 06/29/2016   HGBA1C 6.4 12/24/2016    Ct Chest Wo Contrast  Result Date: 10/04/2016 CLINICAL DATA:  Follow-up right lower lobe nodule EXAM: CT CHEST WITHOUT CONTRAST TECHNIQUE: Multidetector CT imaging of the chest was performed following the standard protocol without IV contrast. COMPARISON:  04/05/2016 and 08/28/2014 CT abdomen FINDINGS: Cardiovascular: Diffuse coronary artery and aortic calcifications. No aneurysm. Heart is normal size. Mediastinum/Nodes: No mediastinal, hilar, or axillary adenopathy. Lungs/Pleura: Right lower lobe pulmonary nodule posteriorly measures 7 x 6 mm on image 95 compared to 8 x 6 mm previously, unchanged. Linear scarring noted medially in the right lower lobe. No new or enlarging pulmonary nodules. No effusions. Upper Abdomen: Imaging into the upper abdomen shows no acute findings. Musculoskeletal: No acute bony abnormality. IMPRESSION: 7 x 6 mm right lower lobe pulmonary nodule posteriorly, stable. This is stable dating back to March 2016 and compatible with a benign nodule. No  additional follow-up necessary. Diffuse coronary artery disease, aortic atherosclerosis. No acute cardiopulmonary disease. Electronically Signed   By: Rolm Baptise M.D.   On: 10/04/2016 10:49    Assessment & Plan:   There are no diagnoses linked to this encounter. I am having Mr. Seal maintain his Vitamin B-12, diphenoxylate-atropine, losartan, atenolol, alfuzosin, escitalopram, clopidogrel, pantoprazole, traMADol, pravastatin, doxycycline, and triamcinolone ointment.  No orders of the defined types were placed in this encounter.    Follow-up: No Follow-up on file.  Walker Kehr, MD

## 2017-02-14 NOTE — Assessment & Plan Note (Signed)
Plavix, ASA, Pravachol,  BP meds:  Atenolol, Losartan

## 2017-02-14 NOTE — Assessment & Plan Note (Signed)
On Atenolol, Losartan 

## 2017-02-14 NOTE — Assessment & Plan Note (Signed)
On B12 

## 2017-02-16 ENCOUNTER — Telehealth: Payer: Self-pay | Admitting: Internal Medicine

## 2017-02-16 NOTE — Telephone Encounter (Signed)
Pt would like to know what he needs to do with arthritis?  He would like to know if he needs to taking meds for this?

## 2017-02-17 NOTE — Telephone Encounter (Signed)
Please advise 

## 2017-02-17 NOTE — Telephone Encounter (Signed)
LM notifying pt

## 2017-02-17 NOTE — Telephone Encounter (Signed)
Take Vit D 2000 iu daily Take Advil 400 mg bid prn Thx

## 2017-05-03 ENCOUNTER — Other Ambulatory Visit: Payer: Self-pay | Admitting: Adult Health

## 2017-05-19 ENCOUNTER — Telehealth: Payer: Self-pay | Admitting: Internal Medicine

## 2017-05-19 NOTE — Telephone Encounter (Signed)
Patient came in to the office stating that he would like to have the Shingles vaccine. Can you add him to the list? He said that he will be here on 05/30/2017 with his wife for an appointment if we would have it by then. I told him I wasn't sure.

## 2017-05-23 NOTE — Telephone Encounter (Signed)
Patient has been added to wait list.

## 2017-05-26 ENCOUNTER — Telehealth: Payer: Self-pay

## 2017-05-26 ENCOUNTER — Other Ambulatory Visit: Payer: Self-pay

## 2017-05-26 MED ORDER — ESCITALOPRAM OXALATE 10 MG PO TABS: 10.0000 mg | ORAL_TABLET | Freq: Every day | ORAL | 5 refills | Status: DC

## 2017-05-26 NOTE — Telephone Encounter (Signed)
Patient came into office today (no prior appt) wanting to have his blood pressure checked--stated he did 6 hours of raking leaves in yard yesterday and has a tightness in his chest since that time--no tingling,numbness,dizziness,arm pain,shortness of breath---he states he has bp medication but usually does not take it because he's "lazy" about taking his meds, his bp today was 170/106---patient refused to go to ED---patient stated he would go home and take his bp medication and rest---patient advised that if he becomes symptomatic, call 911 or go to ED

## 2017-06-02 ENCOUNTER — Encounter: Payer: Self-pay | Admitting: Family Medicine

## 2017-06-02 ENCOUNTER — Ambulatory Visit: Payer: Medicare Other | Admitting: Family Medicine

## 2017-06-02 VITALS — BP 136/74 | HR 65 | Temp 98.5°F | Ht 66.5 in | Wt 178.0 lb

## 2017-06-02 DIAGNOSIS — M25512 Pain in left shoulder: Secondary | ICD-10-CM

## 2017-06-02 DIAGNOSIS — M720 Palmar fascial fibromatosis [Dupuytren]: Secondary | ICD-10-CM | POA: Diagnosis not present

## 2017-06-02 DIAGNOSIS — M79641 Pain in right hand: Secondary | ICD-10-CM

## 2017-06-02 MED ORDER — TRAMADOL HCL 50 MG PO TABS: ORAL_TABLET | ORAL | 0 refills | Status: DC

## 2017-06-02 MED ORDER — AMOXICILLIN-POT CLAVULANATE 875-125 MG PO TABS: 1.0000 | ORAL_TABLET | Freq: Two times a day (BID) | ORAL | 0 refills | Status: AC

## 2017-06-02 NOTE — Progress Notes (Signed)
Austin Torres - 83 y.o. male MRN [id number]  Date of birth: 1933-10-29  SUBJECTIVE:  Including CC & ROS.  Chief Complaint  Patient presents with  . Extremity Laceration    Austin Torres is a 83 y.o. male that is presenting with right hand laceration and bruise. His puppy bit him 3 days. Bruising and swelling is present. Denies fevers. He was bit on the dorsal aspect of his right hand. He notes some swelling and bruising on the dorsal side of his right hand. The pain is minimal and localized. Pain is worse at the end of the day. He takes plavix due to a recent stroke.   Complaining of left shoulder pain. He reports the pain is 2 weeks in duration. The pain is on the lateral aspect of his shoulder. No specific injury. Pain is worse with overhead activities. Has not tried any therapy. He feels like the pain is staying the same. Has not tried any NSAIDS.  He has a contracture of the 4th digit on his right hand. He has had this for years. He doesn't have any pain. Is unable to open his hand completely. Has not had any prior injections.    Review of Systems  Constitutional: Negative for fever.  HENT: Negative for sinus pressure.   Respiratory: Negative for shortness of breath.   Cardiovascular: Negative for chest pain.  Gastrointestinal: Negative for abdominal pain.  Musculoskeletal: Negative for gait problem.  Skin: Positive for color change and wound.  Neurological: Negative for weakness.  Hematological: Negative for adenopathy.    HISTORY: Past Medical, Surgical, Social, and Family History Reviewed & Updated per EMR.   Pertinent Historical Findings include:  Past Medical History:  Diagnosis Date  . B12 deficiency   . BPH (benign prostatic hypertrophy)   . GERD (gastroesophageal reflux disease)   . Hyperlipidemia   . Hypertension   . Hypogonadism male   . Skin cancer     Past Surgical History:  Procedure Laterality Date  . APPENDECTOMY    . EYE SURGERY     glaucoma  procedure -both eyes  . skin cancer excision     scalp  . TONSILLECTOMY      Allergies  Allergen Reactions  . Cardura [Doxazosin Mesylate] Other (See Comments)    dizziness    Family History  Problem Relation Age of Onset  . Heart disease Father   . Heart disease Brother        CAD/MI  . Cancer Neg Hx        no colon cancer, no prostate cancer  . Colon cancer Neg Hx   . Esophageal cancer Neg Hx   . Rectal cancer Neg Hx   . Stomach cancer Neg Hx      Social History   Socioeconomic History  . Marital status: Married    Spouse name: Not on file  . Number of children: 3  . Years of education: 18  . Highest education level: Not on file  Social Needs  . Financial resource strain: Not on file  . Food insecurity - worry: Not on file  . Food insecurity - inability: Not on file  . Transportation needs - medical: Not on file  . Transportation needs - non-medical: Not on file  Occupational History    Employer: RETIRED  Tobacco Use  . Smoking status: Current Every Day Smoker    Packs/day: 1.00    Years: 25.00    Pack years: 25.00    Types: Cigars  .  Smokeless tobacco: Never Used  Substance and Sexual Activity  . Alcohol use: Yes    Alcohol/week: 9.0 oz    Types: 15 Standard drinks or equivalent per week  . Drug use: No  . Sexual activity: Yes    Partners: Female  Other Topics Concern  . Not on file  Social History Narrative   HSG, [employer] @ [place]. Married '61 2 dtrs - '68, 69; 2 sons - '72, '78. 4 grandchildren.   Work - Magazine features editor- Engineer, maintenance). Marriage is stable. He is retired. ACP - No CPR, No mechanical ventilation for more than short-term. No futile or heroic measures.      PHYSICAL EXAM:  VS: BP 136/74 (BP Location: Left Arm, Patient Position: Sitting, Cuff Size: Normal)   Pulse 65   Temp 98.5 F (36.9 C) (Oral)   Ht 5' 6.5" (1.689 m)   Wt 178 lb (80.7 kg)   SpO2 98%   BMI 28.30 kg/m  Physical Exam Gen: NAD, alert,  cooperative with exam, well-appearing ENT: normal lips, normal nasal mucosa,  Eye: normal EOM, normal conjunctiva and lids CV:  no edema, +2 pedal pulses   Resp: no accessory muscle use, non-labored,  Skin: no rashes, ecchymosis on the dorsal aspect of his right hand. One linear 1 cm long superficial cut on the dorsal aspect of his right hand.  Neuro: normal tone, normal sensation to touch Psych:  normal insight, alert and oriented MSK:  Right hand:  Normal wrist ROM  Contracture of the 4th digit.  Unable to open his hand completely  Able to make a fist  Left shoulder:  Pain with ER and abduction  Mild pain with empty can testing and Hawkin's testing.  Normal passive ROM  Normal IR and ER strength to resistance.  Neurovascularly intact.       ASSESSMENT & PLAN:   Dupuytren's contracture of right hand No pain but has not tried any injections previously - can try a series of steroid injections to see if that helps him regain some mobility in his finger ROM   Right hand pain His dog bit him and scratched him. Occurring on his right hand. Up to date on tetanus. Clean and superficial wounds. No deep puncture wounds.  - Augmentin  - tramadol for pain  - given indications to follow up.   Acute pain of left shoulder It appears to be more of his capsule than his rotator cuff.  - counseled on HEP  - advised to follow up if still having symptoms for [place]

## 2017-06-02 NOTE — Patient Instructions (Signed)
Please take a probiotic with the antibiotic.  Please try Aspercreme with lidocaine for the left shoulder. Please follow-up with me in a few weeks if her shoulder does not get any improvement. Please follow-up with me if you like to try an injection of the right hand.   Dupuytren Contracture Dupuytren contracture is a condition in which tissue under the skin of the palm becomes abnormally thickened. This causes one or more of the fingers to curl inward (contract) toward the palm. Eventually, the fingers may not be able to straighten out. This condition affects some or all of the fingers and the palm of the hand. It is often passed along from parent to child (inherited). Dupuytren contracture is a long-term (chronic) condition that develops (progresses) slowly over time. There is no cure, but symptoms can be managed and progression can be slowed with treatment. This condition is usually not dangerous or painful, but it can interfere with everyday tasks. What are the causes? This condition is caused by tissue (fascia) in the palm getting thicker and tighter. When the fascia thickens, it pulls on the cords of tissue (tendons) that control finger movement. This causes the fingers to contract. The cause of fascia thickening is not known. What increases the risk? This condition may be more likely to develop in:  People who are age 81 or older.  Men.  People with a family history of this condition.  People who use tobacco products, including cigarettes, chewing tobacco, and e-cigarettes.  People who drink alcohol excessively.  People with diabetes.  People with autoimmune diseases, such as HIV.  People with seizure disorders.  What are the signs or symptoms? Symptoms may develop in one or both hands. Any of the fingers can contract. The fingers farthest from the thumb are commonly affected. Usually, this condition is painless. You may have discomfort when holding or grabbing objects. Early  symptoms of this condition may include:  Thick, puckered skin on the hand.  One or more lumps (nodules) on the palm. Nodules may be tender when they first appear, but they are generally painless.  Symptoms of this condition develop slowly over months or years. Later symptoms of this condition may include:  Thick cords of tissue in the palm.  Fingers curled up toward the palm.  Inability to straighten the fingers into their normal position.  How is this diagnosed? This condition is diagnosed with a physical exam, which may include:  Looking at your hands and feeling your hands. This is to check for thickened fascia and nodules.  Measuring finger motion.  Doing the [employer]. You may be asked to try to put your hand on a surface, with your palm down and your fingers straight out.  How is this treated? There is no cure for this condition, but treatment can make symptoms more manageable and relieve discomfort. Treatment options may include:  Physical therapy. This can strengthen your hand and increase flexibility.  Occupational therapy. This can help you with everyday tasks that may be more difficult because of your condition.  A hand splint.  Shots (injections). Substances may be injected into your hand, such as: ? Medicines that help to decrease swelling (corticosteroids). ? Proteins (collagenase) to weaken thick tissue. After a collagenase injection, your health care provider may stretch your fingers.  Needle aponeurotomy. In this procedure, a needle is pushed through the skin and into the fascia. Moving the needle against the fascia can weaken or break up the thick tissue.  Surgery.  This may be needed if your condition causes discomfort or interferes with everyday activities. Physical therapy is usually needed after surgery.  In some cases, symptoms never develop to the point of needing major treatment, and caring for yourself at home can be enough to manage your  condition. Symptoms often return after treatment. Follow these instructions at home: If you have a splint:  Do not put pressure on any part of the splint until it is fully hardened. This may take several hours.  Wear the splint as told by your health care provider. Remove it only as told by your health care provider.  Loosen the splint if your fingers tingle, become numb, or turn cold and blue.  Do not let your splint get wet if it is not waterproof. ? If your splint is not waterproof, cover it with a watertight covering when you take a bath or a shower. ? Do not take baths, swim, or use a hot tub until your health care provider approves. Ask your health care provider if you can take showers. You may only be allowed to take sponge baths for bathing.  Keep the splint clean.  Ask your health care provider when it is safe to drive. Hand Care  Take these actions to help protect your hand from possible injury: ? Use tools that have padded grips. ? Wear protective gloves while you work with your hands. ? Avoid repetitive hand movements.  Avoid actions that cause pain or discomfort.  Stretch your hand by gently pulling your fingers backward toward your wrist. Do this as often as is comfortable. Stop if this causes pain.  Gently massage your hand as often as is comfortable.  If directed, apply heat to the affected area as often as told by your health care provider. Use the heat source that your health care provider recommends, such as a moist heat pack or a heating pad. ? Place a towel between your skin and the heat source. ? Leave the heat on for 20-30 minutes. ? Remove the heat if your skin turns bright red. This is especially important if you are unable to feel pain, heat, or cold. You may have a greater risk of getting burned. General instructions  Take over-the-counter and prescription medicines only as told by your health care provider.  Manage any other conditions that you have,  such as diabetes.  If physical therapy was prescribed, do exercises as told by your health care provider.  Keep all follow-up visits as told by your health care provider. This is important. Contact a health care provider if:  You develop new symptoms, or your symptoms get worse.  You have pain that gets worse or does not get better with medicine.  You have difficulty or discomfort with everyday tasks.  You have problems with your splint.  You develop numbness or tingling. Get help right away if:  You have severe pain.  Your fingers change color or become unusually cold. This information is not intended to replace advice given to you by your health care provider. Make sure you discuss any questions you have with your health care provider. Document Released: 03/21/2009 Document Revised: 07/08/2015 Document Reviewed: 10/16/2014 Elsevier Interactive Patient Education  [employer].

## 2017-06-03 DIAGNOSIS — M720 Palmar fascial fibromatosis [Dupuytren]: Secondary | ICD-10-CM | POA: Insufficient documentation

## 2017-06-03 DIAGNOSIS — M25512 Pain in left shoulder: Secondary | ICD-10-CM | POA: Insufficient documentation

## 2017-06-03 DIAGNOSIS — M79641 Pain in right hand: Secondary | ICD-10-CM | POA: Insufficient documentation

## 2017-06-03 NOTE — Assessment & Plan Note (Signed)
It appears to be more of his capsule than his rotator cuff.  - counseled on HEP  - advised to follow up if still having symptoms for [place]

## 2017-06-03 NOTE — Assessment & Plan Note (Signed)
His dog bit him and scratched him. Occurring on his right hand. Up to date on tetanus. Clean and superficial wounds. No deep puncture wounds.  - Augmentin  - tramadol for pain  - given indications to follow up.

## 2017-06-03 NOTE — Assessment & Plan Note (Signed)
No pain but has not tried any injections previously - can try a series of steroid injections to see if that helps him regain some mobility in his finger ROM

## 2017-06-14 ENCOUNTER — Telehealth: Payer: Self-pay

## 2017-06-14 NOTE — Telephone Encounter (Signed)
Left message asking patient to call back to schedule nurse visit to get first shingrix vaccine---if patient calls back to schedule, please let Jenalyn Girdner,RN at [place] office know so that both vaccines can be labeled and placed in refrig

## 2017-06-15 ENCOUNTER — Ambulatory Visit (INDEPENDENT_AMBULATORY_CARE_PROVIDER_SITE_OTHER): Payer: Medicare Other

## 2017-06-15 DIAGNOSIS — Z299 Encounter for prophylactic measures, unspecified: Secondary | ICD-10-CM | POA: Diagnosis not present

## 2017-06-15 NOTE — Telephone Encounter (Signed)
Patient scheduled for 3:15 today

## 2017-06-15 NOTE — Telephone Encounter (Signed)
Both vaccines labeled and placed in refrig 

## 2017-06-22 ENCOUNTER — Encounter (HOSPITAL_COMMUNITY): Payer: Self-pay

## 2017-06-22 ENCOUNTER — Emergency Department (HOSPITAL_COMMUNITY)
Admission: EM | Admit: 2017-06-22 | Discharge: 2017-06-22 | Disposition: A | Discharge status: Home / Self Care | Payer: Medicare Other | Attending: Emergency Medicine | Admitting: Emergency Medicine

## 2017-06-22 DIAGNOSIS — R040 Epistaxis: Secondary | ICD-10-CM | POA: Diagnosis not present

## 2017-06-22 DIAGNOSIS — I10 Essential (primary) hypertension: Secondary | ICD-10-CM | POA: Insufficient documentation

## 2017-06-22 DIAGNOSIS — Z7901 Long term (current) use of anticoagulants: Secondary | ICD-10-CM | POA: Diagnosis not present

## 2017-06-22 DIAGNOSIS — F17210 Nicotine dependence, cigarettes, uncomplicated: Secondary | ICD-10-CM | POA: Diagnosis not present

## 2017-06-22 DIAGNOSIS — R42 Dizziness and giddiness: Secondary | ICD-10-CM | POA: Diagnosis not present

## 2017-06-22 DIAGNOSIS — R000 Tachycardia, unspecified: Secondary | ICD-10-CM | POA: Diagnosis not present

## 2017-06-22 DIAGNOSIS — R404 Transient alteration of awareness: Secondary | ICD-10-CM | POA: Diagnosis not present

## 2017-06-22 LAB — CBC
HEMATOCRIT: 38.2 % — AB (ref 39.0–52.0)
Hemoglobin: 12.7 g/dL — ABNORMAL LOW (ref 13.0–17.0)
MCH: 29.9 pg (ref 26.0–34.0)
MCHC: 33.2 g/dL (ref 30.0–36.0)
MCV: 89.9 fL (ref 78.0–100.0)
Platelets: 164 10*3/uL (ref 150–400)
RBC: 4.25 MIL/uL (ref 4.22–5.81)
RDW: 12.7 % (ref 11.5–15.5)
WBC: 13.5 10*3/uL — ABNORMAL HIGH (ref 4.0–10.5)

## 2017-06-22 LAB — PROTIME-INR
INR: 1.14
Prothrombin Time: 14.5 seconds (ref 11.4–15.2)

## 2017-06-22 MED ORDER — OXYMETAZOLINE HCL 0.05 % NA SOLN
1.0000 | Freq: Once | NASAL | Status: AC
  Administered 2017-06-22: 1 via NASAL
  Filled 2017-06-22: qty 15

## 2017-06-22 MED ORDER — ONDANSETRON HCL 4 MG/2ML IJ SOLN
4.0000 mg | Freq: Once | INTRAMUSCULAR | Status: AC
  Administered 2017-06-22: 4 mg via INTRAVENOUS
  Filled 2017-06-22: qty 2

## 2017-06-22 NOTE — ED Notes (Signed)
Bed: [facility] Expected date:  Expected time:  Means of arrival:  Comments: EMS 84 yo male from home/nosebleed uncontrolled-dizziness HR 129 193/122

## 2017-06-22 NOTE — ED Provider Notes (Signed)
[facility] DEPT Provider Note   CSN: [id number] Arrival date & time: 06/22/17  6644     History   Chief Complaint Chief Complaint  Patient presents with  . Epistaxis    HPI NILO FALLIN is a 84 y.o. male.  HPI Patient is an 84 year old male who presents to the emergency department with complaints of left-sided epistaxis.  He is on Plavix for history of stroke.  Left nose bleeding was controlled by staff Via Afrin and direct pressure prior to my evaluation.  No active bleeding at this time.  No history of nosebleeds.  Denies fevers and chills.  No recent injury or trauma.   Past Medical History:  Diagnosis Date  . B12 deficiency   . BPH (benign prostatic hypertrophy)   . GERD (gastroesophageal reflux disease)   . Hyperlipidemia   . Hypertension   . Hypogonadism male   . Skin cancer     Patient Active Problem List   Diagnosis Date Noted  . Right hand pain 06/03/2017  . Dupuytren's contracture of right hand 06/03/2017  . Acute pain of left shoulder 06/03/2017  . Elevated glucose 12/24/2016  . Fatigue 12/24/2016  . Stroke (cerebrum) ([place]) 06/30/2016  . Acute ischemic stroke ([place]) 06/29/2016  . Solitary pulmonary nodule 05/26/2016  . Cigar smoker motivated to quit 04/03/2016  . Hematuria 04/01/2016  . Nontraumatic rupture of bicep tendon 12/16/2015  . Arthropathy of shoulder region 11/28/2015  . COPD GOLD I  07/30/2015  . CAP (community acquired pneumonia) 06/23/2015  . Bronchial spasm 06/23/2015  . Rupture of quadriceps muscle 04/15/2015  . Quadriceps tendon rupture 04/04/2015  . Vitamin D deficiency 01/18/2015  . Diarrhea 10/22/2013  . Low back pain 11/27/2011  . Well adult exam 01/10/2011  . TMJ tenderness 09/12/2010  . B12 deficiency 11/24/2009  . Hypogonadism male 11/20/2009  . PERIPHERAL NEUROPATHY 11/20/2009  . HYPERLIPIDEMIA 12/26/2006  . Essential hypertension 12/26/2006  . BPH (benign prostatic hyperplasia)  12/26/2006    Past Surgical History:  Procedure Laterality Date  . APPENDECTOMY    . EYE SURGERY     glaucoma procedure -both eyes  . skin cancer excision     scalp  . TONSILLECTOMY         Home Medications    Prior to Admission medications   Medication Sig Start Date End Date Taking? Authorizing Provider  alfuzosin (UROXATRAL) 10 MG 24 hr tablet Take 1 tablet (10 mg total) by mouth daily with breakfast. 05/26/16   Plotnikov, Evie Lacks, MD  atenolol (TENORMIN) 50 MG tablet Take 1 tablet (50 mg total) by mouth 2 (two) times daily. 05/26/16   Plotnikov, Evie Lacks, MD  clopidogrel (PLAVIX) 75 MG tablet Take 1 tablet (75 mg total) by mouth daily. 07/30/16   Plotnikov, Evie Lacks, MD  Cyanocobalamin (VITAMIN B-12) 1000 MCG SUBL Place 1 tablet (1,000 mcg total) under the tongue daily. 11/28/15   Plotnikov, Evie Lacks, MD  diphenoxylate-atropine (LOMOTIL) 2.5-0.025 MG tablet Take 1 tablet by mouth 4 (four) times daily as needed for diarrhea or loose stools. 04/01/16   Plotnikov, Evie Lacks, MD  escitalopram (LEXAPRO) 10 MG tablet Take 1 tablet (10 mg total) by mouth daily. 05/26/17   Plotnikov, Evie Lacks, MD  losartan (COZAAR) 100 MG tablet Take 1 tablet (100 mg total) by mouth daily. 05/26/16   Plotnikov, Evie Lacks, MD  pantoprazole (PROTONIX) 40 MG tablet Take 1 tablet (40 mg total) by mouth daily. 07/30/16   Plotnikov, Evie Lacks, MD  pravastatin (PRAVACHOL) 40 MG tablet Take 1 tablet (40 mg total) by mouth daily. 09/21/16   Plotnikov, Evie Lacks, MD  traMADol (ULTRAM) 50 MG tablet TAKE 1/2-1 TABLET BY MOUTH EVERY 12 HOURS AS NEEDED FOR SEVERE PAIN 06/02/17   Rosemarie Ax, MD  triamcinolone ointment (KENALOG) 0.1 % Apply 1 application topically 2 (two) times daily. 12/24/16   Plotnikov, Evie Lacks, MD    Family History Family History  Problem Relation Age of Onset  . Heart disease Father   . Heart disease Brother        CAD/MI  . Cancer Neg Hx        no colon cancer, no prostate cancer    . Colon cancer Neg Hx   . Esophageal cancer Neg Hx   . Rectal cancer Neg Hx   . Stomach cancer Neg Hx     Social History Social History   Tobacco Use  . Smoking status: Current Every Day Smoker    Packs/day: 1.00    Years: 25.00    Pack years: 25.00    Types: Cigars  . Smokeless tobacco: Never Used  Substance Use Topics  . Alcohol use: Yes    Alcohol/week: 9.0 oz    Types: 15 Standard drinks or equivalent per week  . Drug use: No     Allergies   Cardura [doxazosin mesylate]   Review of Systems Review of Systems  All other systems reviewed and are negative.    Physical Exam Updated Vital Signs BP (!) 133/92 (BP Location: Left Arm)   Pulse (!) 130   Resp (!) 22   Ht 5\' 6"  (1.676 m)   Wt 80.7 kg (178 lb)   SpO2 96%   BMI 28.73 kg/m   Physical Exam  Constitutional: He is oriented to person, place, and time. He appears well-developed and well-nourished.  HENT:  Head: Normocephalic.  Stigmata of recent bleed in the left nare.  No active bleeding at this time.  Posterior pharynx is normal  Eyes: EOM are normal.  Neck: Normal range of motion.  Pulmonary/Chest: Effort normal.  Abdominal: He exhibits no distension.  Musculoskeletal: Normal range of motion.  Neurological: He is alert and oriented to person, place, and time.  Psychiatric: He has a normal mood and affect.  Nursing note and vitals reviewed.    ED Treatments / Results  Labs (all labs ordered are listed, but only abnormal results are displayed) Labs Reviewed  CBC - Abnormal; Notable for the following components:      Result Value   WBC 13.5 (*)    Hemoglobin 12.7 (*)    HCT 38.2 (*)    All other components within normal limits  PROTIME-INR    EKG  EKG Interpretation  Date/Time:  Wednesday June 22 2017 06:53:13 EST Ventricular Rate:  129 PR Interval:    QRS Duration: 98 QT Interval:  390 QTC Calculation: 571 R Axis:   -68 Text Interpretation:  Sinus tachycardia Left axis  deviation Incomplete right bundle branch block Inferior infarct , age undetermined Anterior infarct , age undetermined Abnormal ECG Confirmed by Veryl Speak [id number]) on 06/22/2017 6:57:54 AM       Radiology No results found.  Procedures Procedures (including critical care time)  Medications Ordered in ED Medications  oxymetazoline (AFRIN) 0.05 % nasal spray 1 spray (not administered)  ondansetron (ZOFRAN) injection 4 mg (not administered)     Initial Impression / Assessment and Plan / ED Course  I have reviewed the triage  vital signs and the nursing notes.  Pertinent labs & imaging results that were available during my care of the patient were reviewed by me and considered in my medical decision making (see chart for details).     Recent left-sided epistaxis.  Epistaxis is now since resolved.  Patient understands return to the ER for new or worsening symptoms.  ENT referral given.  Ice applied   Final Clinical Impressions(s) / ED Diagnoses   Final diagnoses:  Left-sided epistaxis    ED Discharge Orders    None       Jola Schmidt, MD 06/22/17 [phone]

## 2017-06-22 NOTE — ED Triage Notes (Signed)
Pt arrive from home via [facility] with complaints of a nosebleed over the last hour. Pt currently coughing up clots. Pt states he is dizzy, does report he is on blood thinners. A&O

## 2017-06-22 NOTE — ED Notes (Signed)
Bed: IZ12 Expected date:  Expected time:  Means of arrival:  Comments: Austin Torres

## 2017-06-27 ENCOUNTER — Encounter: Payer: Self-pay | Admitting: Internal Medicine

## 2017-06-27 ENCOUNTER — Ambulatory Visit: Payer: Medicare Other | Admitting: Internal Medicine

## 2017-06-27 VITALS — BP 130/66 | HR 64 | Temp 97.8°F | Ht 66.0 in | Wt 179.0 lb

## 2017-06-27 DIAGNOSIS — E559 Vitamin D deficiency, unspecified: Secondary | ICD-10-CM

## 2017-06-27 DIAGNOSIS — R002 Palpitations: Secondary | ICD-10-CM

## 2017-06-27 DIAGNOSIS — E538 Deficiency of other specified B group vitamins: Secondary | ICD-10-CM

## 2017-06-27 DIAGNOSIS — R040 Epistaxis: Secondary | ICD-10-CM

## 2017-06-27 DIAGNOSIS — I499 Cardiac arrhythmia, unspecified: Secondary | ICD-10-CM | POA: Diagnosis not present

## 2017-06-27 MED ORDER — TAMSULOSIN HCL 0.4 MG PO CAPS: 0.4000 mg | ORAL_CAPSULE | Freq: Every day | ORAL | 11 refills | Status: DC

## 2017-06-27 MED ORDER — ESCITALOPRAM OXALATE 20 MG PO TABS: 20.0000 mg | ORAL_TABLET | Freq: Every day | ORAL | 11 refills | Status: DC

## 2017-06-27 NOTE — Assessment & Plan Note (Signed)
On B12 

## 2017-06-27 NOTE — Assessment & Plan Note (Signed)
L: resolved

## 2017-06-27 NOTE — Assessment & Plan Note (Signed)
On Vit D 

## 2017-06-27 NOTE — Assessment & Plan Note (Signed)
EKG

## 2017-06-27 NOTE — Progress Notes (Signed)
Subjective:  Patient ID: Austin Torres, male    DOB: 1934/03/01  Age: 84 y.o. MRN: [id number]  CC: No chief complaint on file.   HPI Austin Torres presents for ER f/u for L nose bleed on 1/16 - stopped. He had labs/EKG C/o stress at home. F/u HTN, dyslipidemia He was told he had a fib (?)  Outpatient Medications Prior to Visit  Medication Sig Dispense Refill  . alfuzosin (UROXATRAL) 10 MG 24 hr tablet Take 1 tablet (10 mg total) by mouth daily with breakfast. 90 tablet 3  . atenolol (TENORMIN) 50 MG tablet Take 1 tablet (50 mg total) by mouth 2 (two) times daily. 180 tablet 3  . clopidogrel (PLAVIX) 75 MG tablet Take 1 tablet (75 mg total) by mouth daily. 30 tablet 11  . Cyanocobalamin (VITAMIN B-12) 1000 MCG SUBL Place 1 tablet (1,000 mcg total) under the tongue daily. 100 tablet 3  . diphenoxylate-atropine (LOMOTIL) 2.5-0.025 MG tablet Take 1 tablet by mouth 4 (four) times daily as needed for diarrhea or loose stools. 60 tablet 0  . escitalopram (LEXAPRO) 10 MG tablet Take 1 tablet (10 mg total) by mouth daily. 30 tablet 5  . losartan (COZAAR) 100 MG tablet Take 1 tablet (100 mg total) by mouth daily. 90 tablet 3  . pantoprazole (PROTONIX) 40 MG tablet Take 1 tablet (40 mg total) by mouth daily. 30 tablet 11  . pravastatin (PRAVACHOL) 40 MG tablet Take 1 tablet (40 mg total) by mouth daily. 90 tablet 2  . traMADol (ULTRAM) 50 MG tablet TAKE 1/2-1 TABLET BY MOUTH EVERY 12 HOURS AS NEEDED FOR SEVERE PAIN 30 tablet 0  . triamcinolone ointment (KENALOG) 0.1 % Apply 1 application topically 2 (two) times daily. 30 g 0   No facility-administered medications prior to visit.     ROS Review of Systems  Constitutional: Negative for appetite change, fatigue and unexpected weight change.  HENT: Positive for nosebleeds. Negative for congestion, sneezing, sore throat and trouble swallowing.   Eyes: Negative for itching and visual disturbance.  Respiratory: Negative for cough.     Cardiovascular: Negative for chest pain, palpitations and leg swelling.  Gastrointestinal: Negative for abdominal distention, blood in stool, diarrhea and nausea.  Genitourinary: Negative for frequency and hematuria.  Musculoskeletal: Negative for back pain, gait problem, joint swelling and neck pain.  Skin: Negative for rash.  Neurological: Negative for dizziness, tremors, speech difficulty and weakness.  Psychiatric/Behavioral: Positive for decreased concentration, dysphoric mood and sleep disturbance. Negative for agitation and suicidal ideas. The patient is nervous/anxious.     Objective:  BP 130/66 (BP Location: Left Arm, Patient Position: Sitting, Cuff Size: Large)   Pulse 64   Temp 97.8 F (36.6 C) (Oral)   Ht 5\' 6"  (1.676 m)   Wt 179 lb (81.2 kg)   SpO2 99%   BMI 28.89 kg/m   BP Readings from Last 3 Encounters:  06/27/17 130/66  06/22/17 129/87  06/02/17 136/74    Wt Readings from Last 3 Encounters:  06/27/17 179 lb (81.2 kg)  06/22/17 178 lb (80.7 kg)  06/02/17 178 lb (80.7 kg)    Physical Exam  Constitutional: He is oriented to person, place, and time. He appears well-developed. No distress.  NAD  HENT:  Mouth/Throat: Oropharynx is clear and moist.  Eyes: Conjunctivae are normal. Pupils are equal, round, and reactive to light.  Neck: Normal range of motion. No JVD present. No thyromegaly present.  Cardiovascular: Normal rate, regular rhythm, normal heart sounds  and intact distal pulses. Exam reveals no gallop and no friction rub.  No murmur heard. Pulmonary/Chest: Effort normal and breath sounds normal. No respiratory distress. He has no wheezes. He has no rales. He exhibits no tenderness.  Abdominal: Soft. Bowel sounds are normal. He exhibits no distension and no mass. There is no tenderness. There is no rebound and no guarding.  Musculoskeletal: Normal range of motion. He exhibits no edema or tenderness.  Lymphadenopathy:    He has no cervical adenopathy.   Neurological: He is alert and oriented to person, place, and time. He has normal reflexes. No cranial nerve deficit. He exhibits normal muscle tone. He displays a negative Romberg sign. Coordination and gait normal.  Skin: Skin is warm and dry. No rash noted.  Psychiatric: He has a normal mood and affect. His behavior is normal. Judgment and thought content normal.     Procedure: EKG Indication: ?arrhythmia by history Impression: NSR. No acute changes.  Lab Results  Component Value Date   WBC 13.5 (H) 06/22/2017   HGB 12.7 (L) 06/22/2017   HCT 38.2 (L) 06/22/2017   PLT 164 06/22/2017   GLUCOSE 107 (H) 02/14/2017   CHOL 171 07/29/2016   TRIG 150.0 (H) 07/29/2016   HDL 44.70 07/29/2016   LDLDIRECT 155.3 02/15/2012   LDLCALC 96 07/29/2016   ALT 17 06/30/2016   AST 24 06/30/2016   NA 135 02/14/2017   K 4.1 02/14/2017   CL 100 02/14/2017   CREATININE 1.22 02/14/2017   BUN 14 02/14/2017   CO2 26 02/14/2017   TSH 4.09 12/24/2016   PSA 2.55 04/01/2016   INR 1.14 06/22/2017   HGBA1C 6.4 12/24/2016    No results found.  Assessment & Plan:   There are no diagnoses linked to this encounter. I am having Austin Torres maintain his Vitamin B-12, diphenoxylate-atropine, losartan, atenolol, alfuzosin, clopidogrel, pantoprazole, pravastatin, triamcinolone ointment, escitalopram, and traMADol.  No orders of the defined types were placed in this encounter.    Follow-up: No Follow-up on file.  Walker Kehr, MD

## 2017-07-19 ENCOUNTER — Telehealth: Payer: Self-pay

## 2017-07-19 NOTE — Telephone Encounter (Signed)
pls give it another few weeks. OV in 4 wks if needed Thx

## 2017-07-19 NOTE — Telephone Encounter (Signed)
Routing to dr [employer], patient came into office today and wanted to know if he should already be seeing results after starting flomax on 06/27/17---he's better during the day, but still going a lot at night---but if he needs to be on medicine longer to see results, then that's fine, he will just continue medication---or does he need to see you again if it should already be working well----please advise, I will call patient back at [phone], thanks

## 2017-07-19 NOTE — Telephone Encounter (Signed)
Advised patient of dr plotnikov's note/instructions 

## 2017-07-22 ENCOUNTER — Telehealth: Payer: Self-pay | Admitting: Internal Medicine

## 2017-07-22 DIAGNOSIS — D649 Anemia, unspecified: Secondary | ICD-10-CM

## 2017-07-22 DIAGNOSIS — E291 Testicular hypofunction: Secondary | ICD-10-CM

## 2017-07-22 DIAGNOSIS — E785 Hyperlipidemia, unspecified: Secondary | ICD-10-CM

## 2017-07-22 DIAGNOSIS — N320 Bladder-neck obstruction: Secondary | ICD-10-CM

## 2017-07-22 NOTE — Telephone Encounter (Signed)
Please advise about labs

## 2017-07-22 NOTE — Telephone Encounter (Signed)
Pt called in to schedule his follow up apt for 08/12/17. Pt would like to know if he is able to come in a few days sooner to have labs completed? If so, please place orders and schedule.    Thanks.

## 2017-07-25 NOTE — Telephone Encounter (Signed)
Okay CBC for the diagnosis of anemia, BMET, TSH and lipids for the diagnosis of dyslipidemia.  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

## 2017-07-26 NOTE — Telephone Encounter (Signed)
Labs entered, unable to reach pt

## 2017-08-08 NOTE — Telephone Encounter (Signed)
Lab added

## 2017-08-08 NOTE — Addendum Note (Signed)
Addended by: Karren Cobble on: 08/08/2017 03:49 PM   Modules accepted: Orders

## 2017-08-08 NOTE — Telephone Encounter (Signed)
Pt would like to have his testosterone checked as well due to some symptoms he is having that he thinks may be coming from low testosterone levels. Can this be added to his order please?

## 2017-08-09 ENCOUNTER — Other Ambulatory Visit (INDEPENDENT_AMBULATORY_CARE_PROVIDER_SITE_OTHER): Payer: Medicare Other

## 2017-08-09 DIAGNOSIS — E291 Testicular hypofunction: Secondary | ICD-10-CM

## 2017-08-09 DIAGNOSIS — N320 Bladder-neck obstruction: Secondary | ICD-10-CM | POA: Diagnosis not present

## 2017-08-09 DIAGNOSIS — E785 Hyperlipidemia, unspecified: Secondary | ICD-10-CM

## 2017-08-09 DIAGNOSIS — D649 Anemia, unspecified: Secondary | ICD-10-CM

## 2017-08-09 LAB — BASIC METABOLIC PANEL
BUN: 20 mg/dL (ref 6–23)
CHLORIDE: 102 meq/L (ref 96–112)
CO2: 28 meq/L (ref 19–32)
Calcium: 9.7 mg/dL (ref 8.4–10.5)
Creatinine, Ser: 1.15 mg/dL (ref 0.40–1.50)
GFR: 64.79 mL/min (ref >60.00)
GLUCOSE: 106 mg/dL — AB (ref 70–99)
POTASSIUM: 4.7 meq/L (ref 3.5–5.1)
SODIUM: 138 meq/L (ref 135–145)

## 2017-08-09 LAB — CBC WITH DIFFERENTIAL/PLATELET
BASOS PCT: 1 % (ref 0.0–3.0)
Basophils Absolute: 0.1 10*3/uL (ref 0.0–0.1)
EOS PCT: 2.3 % (ref 0.0–5.0)
Eosinophils Absolute: 0.2 10*3/uL (ref 0.0–0.7)
HCT: 40.9 % (ref 39.0–52.0)
Hemoglobin: 13.5 g/dL (ref 13.0–17.0)
LYMPHS ABS: 1.5 10*3/uL (ref 0.7–4.0)
Lymphocytes Relative: 14.7 % (ref 12.0–46.0)
MCHC: 33 g/dL (ref 30.0–36.0)
MCV: 86.9 fl (ref 78.0–100.0)
Monocytes Absolute: 0.9 10*3/uL (ref 0.1–1.0)
Monocytes Relative: 8.4 % (ref 3.0–12.0)
NEUTROS ABS: 7.6 10*3/uL (ref 1.4–7.7)
NEUTROS PCT: 73.6 % (ref 43.0–77.0)
PLATELETS: 190 10*3/uL (ref 150.0–400.0)
RBC: 4.71 Mil/uL (ref 4.22–5.81)
RDW: 13.6 % (ref 11.5–15.5)
WBC: 10.4 10*3/uL (ref 4.0–10.5)

## 2017-08-09 LAB — LIPID PANEL
CHOLESTEROL: 208 mg/dL — AB (ref 0–200)
HDL: 46.5 mg/dL (ref >39.00)
NonHDL: 161.97
Total CHOL/HDL Ratio: 4
Triglycerides: 207 mg/dL — ABNORMAL HIGH (ref 0.0–149.0)
VLDL: 41.4 mg/dL — ABNORMAL HIGH (ref 0.0–40.0)

## 2017-08-09 LAB — TSH: TSH: 3.94 u[IU]/mL (ref 0.35–4.50)

## 2017-08-09 LAB — LDL CHOLESTEROL, DIRECT: Direct LDL: 136 mg/dL

## 2017-08-09 LAB — PSA: PSA: 3.16 ng/mL (ref 0.10–4.00)

## 2017-08-09 LAB — TESTOSTERONE: Testosterone: 282.66 ng/dL — ABNORMAL LOW (ref 300.00–890.00)

## 2017-08-12 ENCOUNTER — Ambulatory Visit: Payer: Medicare Other | Admitting: Internal Medicine

## 2017-08-12 ENCOUNTER — Encounter: Payer: Self-pay | Admitting: Internal Medicine

## 2017-08-12 VITALS — BP 126/72 | HR 63 | Temp 97.9°F | Ht 66.0 in | Wt 175.0 lb

## 2017-08-12 DIAGNOSIS — E559 Vitamin D deficiency, unspecified: Secondary | ICD-10-CM

## 2017-08-12 DIAGNOSIS — R972 Elevated prostate specific antigen [PSA]: Secondary | ICD-10-CM

## 2017-08-12 DIAGNOSIS — E538 Deficiency of other specified B group vitamins: Secondary | ICD-10-CM

## 2017-08-12 DIAGNOSIS — I10 Essential (primary) hypertension: Secondary | ICD-10-CM

## 2017-08-12 DIAGNOSIS — N401 Enlarged prostate with lower urinary tract symptoms: Secondary | ICD-10-CM

## 2017-08-12 DIAGNOSIS — R350 Frequency of micturition: Secondary | ICD-10-CM

## 2017-08-12 MED ORDER — FINASTERIDE 5 MG PO TABS: 5.0000 mg | ORAL_TABLET | Freq: Every day | ORAL | 3 refills | Status: DC

## 2017-08-12 NOTE — Assessment & Plan Note (Signed)
Plavix, ASA, Pravachol,  Atenolol, Losartan

## 2017-08-12 NOTE — Assessment & Plan Note (Signed)
Atenolol, Losartan 

## 2017-08-12 NOTE — Assessment & Plan Note (Signed)
On B12 

## 2017-08-12 NOTE — Assessment & Plan Note (Signed)
Vit d 

## 2017-08-12 NOTE — Progress Notes (Signed)
Subjective:  Patient ID: Austin Torres, male    DOB: November 25, 1933  Age: 84 y.o. MRN: [id number]  CC: No chief complaint on file.   HPI Austin Torres presents for fatigue, HTN, anxiety f/u  Outpatient Medications Prior to Visit  Medication Sig Dispense Refill  . atenolol (TENORMIN) 50 MG tablet Take 1 tablet (50 mg total) by mouth 2 (two) times daily. 180 tablet 3  . clopidogrel (PLAVIX) 75 MG tablet Take 1 tablet (75 mg total) by mouth daily. 30 tablet 11  . Cyanocobalamin (VITAMIN B-12) 1000 MCG SUBL Place 1 tablet (1,000 mcg total) under the tongue daily. 100 tablet 3  . escitalopram (LEXAPRO) 20 MG tablet Take 1 tablet (20 mg total) by mouth daily. 30 tablet 11  . losartan (COZAAR) 100 MG tablet Take 1 tablet (100 mg total) by mouth daily. 90 tablet 3  . pantoprazole (PROTONIX) 40 MG tablet Take 1 tablet (40 mg total) by mouth daily. 30 tablet 11  . pravastatin (PRAVACHOL) 40 MG tablet Take 1 tablet (40 mg total) by mouth daily. 90 tablet 2  . tamsulosin (FLOMAX) 0.4 MG CAPS capsule Take 1 capsule (0.4 mg total) by mouth daily. 30 capsule 11  . traMADol (ULTRAM) 50 MG tablet TAKE 1/2-1 TABLET BY MOUTH EVERY 12 HOURS AS NEEDED FOR SEVERE PAIN 30 tablet 0  . triamcinolone ointment (KENALOG) 0.1 % Apply 1 application topically 2 (two) times daily. 30 g 0   No facility-administered medications prior to visit.     ROS Review of Systems  Constitutional: Positive for fatigue. Negative for appetite change and unexpected weight change.  HENT: Negative for congestion, nosebleeds, sneezing, sore throat and trouble swallowing.   Eyes: Negative for itching and visual disturbance.  Respiratory: Negative for cough.   Cardiovascular: Negative for chest pain, palpitations and leg swelling.  Gastrointestinal: Negative for abdominal distention, blood in stool, diarrhea and nausea.  Genitourinary: Negative for frequency and hematuria.  Musculoskeletal: Negative for back pain, gait problem,  joint swelling and neck pain.  Skin: Negative for rash.  Neurological: Negative for dizziness, tremors, speech difficulty and weakness.  Psychiatric/Behavioral: Positive for dysphoric mood. Negative for agitation, sleep disturbance and suicidal ideas. The patient is nervous/anxious.     Objective:  BP 126/72 (BP Location: Left Arm, Patient Position: Sitting, Cuff Size: Large)   Pulse 63   Temp 97.9 F (36.6 C) (Oral)   Ht 5\' 6"  (1.676 m)   Wt 175 lb (79.4 kg)   SpO2 99%   BMI 28.25 kg/m   BP Readings from Last 3 Encounters:  08/12/17 126/72  06/27/17 130/66  06/22/17 129/87    Wt Readings from Last 3 Encounters:  08/12/17 175 lb (79.4 kg)  06/27/17 179 lb (81.2 kg)  06/22/17 178 lb (80.7 kg)    Physical Exam  Constitutional: He is oriented to person, place, and time. He appears well-developed. No distress.  NAD  HENT:  Mouth/Throat: Oropharynx is clear and moist.  Eyes: Conjunctivae are normal. Pupils are equal, round, and reactive to light.  Neck: Normal range of motion. No JVD present. No thyromegaly present.  Cardiovascular: Normal rate, regular rhythm, normal heart sounds and intact distal pulses. Exam reveals no gallop and no friction rub.  No murmur heard. Pulmonary/Chest: Effort normal and breath sounds normal. No respiratory distress. He has no wheezes. He has no rales. He exhibits no tenderness.  Abdominal: Soft. Bowel sounds are normal. He exhibits no distension and no mass. There is no tenderness. There  is no rebound and no guarding.  Musculoskeletal: Normal range of motion. He exhibits no edema or tenderness.  Lymphadenopathy:    He has no cervical adenopathy.  Neurological: He is alert and oriented to person, place, and time. He has normal reflexes. No cranial nerve deficit. He exhibits normal muscle tone. He displays a negative Romberg sign. Coordination and gait normal.  Skin: Skin is warm and dry. No rash noted.  Psychiatric: He has a normal mood and  affect. His behavior is normal. Judgment and thought content normal.    Lab Results  Component Value Date   WBC 10.4 08/09/2017   HGB 13.5 08/09/2017   HCT 40.9 08/09/2017   PLT 190.0 08/09/2017   GLUCOSE 106 (H) 08/09/2017   CHOL 208 (H) 08/09/2017   TRIG 207.0 (H) 08/09/2017   HDL 46.50 08/09/2017   LDLDIRECT 136.0 08/09/2017   LDLCALC 96 07/29/2016   ALT 17 06/30/2016   AST 24 06/30/2016   NA 138 08/09/2017   K 4.7 08/09/2017   CL 102 08/09/2017   CREATININE 1.15 08/09/2017   BUN 20 08/09/2017   CO2 28 08/09/2017   TSH 3.94 08/09/2017   PSA 3.16 08/09/2017   INR 1.14 06/22/2017   HGBA1C 6.4 12/24/2016    No results found.  Assessment & Plan:   There are no diagnoses linked to this encounter. I am having Austin Torres maintain his Vitamin B-12, losartan, atenolol, clopidogrel, pantoprazole, pravastatin, triamcinolone ointment, traMADol, tamsulosin, and escitalopram.  No orders of the defined types were placed in this encounter.    Follow-up: No Follow-up on file.  Walker Kehr, MD

## 2017-08-12 NOTE — Assessment & Plan Note (Addendum)
Dr Jeffie Pollock Chronic D/c Cardura. D/c Uroxatral On Flomax. Add Proscar PSA in 6 mo

## 2017-08-12 NOTE — Patient Instructions (Signed)
There are natural ways to boost your testosterone:  Alcohol < 2 drinks a day  1. Lose Weight If you're overweight, shedding the excess pounds may increase your testosterone levels, according to multiple research. Overweight men are more likely to have low testosterone levels to begin with, so this is an important trick to increase your body's testosterone production when you need it most.   2. Strength Training    Strength training is also known to boost testosterone levels, provided you are doing so intensely enough. When strength training to boost testosterone, you'll want to increase the weight and lower your number of reps, and then focus on exercises that work a large number of muscles.  3. Optimize Your Vitamin D Levels Vitamin D, a steroid hormone, is essential for the healthy development of the nucleus of the sperm cell, and helps maintain semen quality and sperm count. Vitamin D also increases levels of testosterone, which may boost libido. In one study, overweight men who were given vitamin D supplements had a significant increase in testosterone levels after one year.  4. Reduce Stress When you're under a lot of stress, your body releases high levels of the stress hormone cortisol. This hormone actually blocks the effects of testosterone, presumably because, from a biological standpoint, testosterone-associated behaviors (mating, competing, aggression) may have lowered your chances of survival in an emergency (hence, the "fight or flight" response is dominant, courtesy of cortisol).  5. Limit or Eliminate Sugar from Your Diet Testosterone levels decrease after you eat sugar, which is likely because the sugar leads to a high insulin level, another factor leading to low testosterone.  6. Eat Healthy Fats By healthy, this means not only mon- and polyunsaturated fats, like that found in avocadoes and nuts, but also saturated, as these are essential for building testosterone. Research  shows that a diet with less than 40 percent of energy as fat (and that mainly from animal sources, i.e. saturated) lead to a decrease in testosterone levels.  It's important to understand that your body requires saturated fats from animal and vegetable sources (such as meat, dairy, certain oils, and tropical plants like coconut) for optimal functioning, and if you neglect this important food group in favor of sugar, grains and other starchy carbs, your health and weight are almost guaranteed to suffer. Examples of healthy fats you can eat more of to give your testosterone levels a boost include:  Olives and Olive oil  Coconuts and coconut oil Butter made from organic milk  Raw nuts, such as, almonds or pecans Eggs Avocados   Meats Palm oil Unheated organic nut oils   7. "Testosterone boosters" containing Vitamin D-3, Niacin, Vitamin B-6, Vitamin B-12, Magnesium, Zinc, Selenium, D-Aspartic Acid, Fenugreed Seed Extract, Oyst[place]hell, [place] Extract, [place] Ginseng may be helpful as well.

## 2017-08-15 ENCOUNTER — Ambulatory Visit (INDEPENDENT_AMBULATORY_CARE_PROVIDER_SITE_OTHER): Payer: Medicare Other | Admitting: *Deleted

## 2017-08-15 DIAGNOSIS — Z23 Encounter for immunization: Secondary | ICD-10-CM

## 2017-08-16 ENCOUNTER — Other Ambulatory Visit: Payer: Self-pay | Admitting: Internal Medicine

## 2017-09-01 ENCOUNTER — Ambulatory Visit: Payer: Medicare Other | Admitting: Family Medicine

## 2017-09-01 ENCOUNTER — Encounter: Payer: Self-pay | Admitting: Family Medicine

## 2017-09-01 VITALS — BP 136/72 | HR 62 | Temp 98.6°F | Ht 66.0 in | Wt 179.0 lb

## 2017-09-01 DIAGNOSIS — M79671 Pain in right foot: Secondary | ICD-10-CM

## 2017-09-01 MED ORDER — PREDNISONE 5 MG PO TABS
ORAL_TABLET | ORAL | 0 refills | Status: DC
Start: 2017-09-01 — End: 2017-09-12

## 2017-09-01 NOTE — Patient Instructions (Signed)
Please try to ice the area.  Please follow up with me in order to have custom orthotics made at the [facility] location  Please complete the prednisone

## 2017-09-01 NOTE — Assessment & Plan Note (Signed)
Pain seems to be related to the loss of this cushion on his plantar aspect. He could have a component of sesamoiditis - continue capsaicin cream  - ice and obtain a cushioned insole  - if no improvement would benefit from custom orthotics.

## 2017-09-01 NOTE — Progress Notes (Signed)
Austin Torres - 84 y.o. male MRN [id number]  Date of birth: 1933-07-18  SUBJECTIVE:  Including CC & ROS.  Chief Complaint  Patient presents with  . Feet pain    Austin Torres is a 84 y.o. male that is presenting with right feet pain. Pain has been ongoing for four days. Pain is located on the plantar aspect of the 1st MTP joint and arch of his right foot. Denies injury.Pain is intermittent.. He has been applying capsaicin cream and soaking his feet in [place] with some improvement.     Review of Systems  Constitutional: Negative for fever.  HENT: Negative for congestion.   Respiratory: Negative for cough.   Musculoskeletal: Negative for gait problem.  Skin: Negative for color change.    HISTORY: Past Medical, Surgical, Social, and Family History Reviewed & Updated per EMR.   Pertinent Historical Findings include:  Past Medical History:  Diagnosis Date  . B12 deficiency   . BPH (benign prostatic hypertrophy)   . GERD (gastroesophageal reflux disease)   . Hyperlipidemia   . Hypertension   . Hypogonadism male   . Skin cancer     Past Surgical History:  Procedure Laterality Date  . APPENDECTOMY    . EYE SURGERY     glaucoma procedure -both eyes  . skin cancer excision     scalp  . TONSILLECTOMY      Allergies  Allergen Reactions  . Cardura [Doxazosin Mesylate] Other (See Comments)    dizziness    Family History  Problem Relation Age of Onset  . Heart disease Father   . Heart disease Brother        CAD/MI  . Cancer Neg Hx        no colon cancer, no prostate cancer  . Colon cancer Neg Hx   . Esophageal cancer Neg Hx   . Rectal cancer Neg Hx   . Stomach cancer Neg Hx      Social History   Socioeconomic History  . Marital status: Married    Spouse name: Not on file  . Number of children: 3  . Years of education: 68  . Highest education level: Not on file  Occupational History    Employer: RETIRED  Social Needs  . Financial resource strain: Not on  file  . Food insecurity:    Worry: Not on file    Inability: Not on file  . Transportation needs:    Medical: Not on file    Non-medical: Not on file  Tobacco Use  . Smoking status: Current Every Day Smoker    Packs/day: 1.00    Years: 25.00    Pack years: 25.00    Types: Cigars  . Smokeless tobacco: Never Used  Substance and Sexual Activity  . Alcohol use: Yes    Alcohol/week: 9.0 oz    Types: 15 Standard drinks or equivalent per week  . Drug use: No  . Sexual activity: Yes    Partners: Female  Lifestyle  . Physical activity:    Days per week: Not on file    Minutes per session: Not on file  . Stress: Not on file  Relationships  . Social connections:    Talks on phone: Not on file    Gets together: Not on file    Attends religious service: Not on file    Active member of club or organization: Not on file    Attends meetings of clubs or organizations: Not on file  Relationship status: Not on file  . Intimate partner violence:    Fear of current or ex partner: Not on file    Emotionally abused: Not on file    Physically abused: Not on file    Forced sexual activity: Not on file  Other Topics Concern  . Not on file  Social History Narrative   HSG, [employer] [facility] [place]. Married '61 2 dtrs - '68, 69; 2 sons - '72, '78. 4 grandchildren.   Work - Magazine features editor- Engineer, maintenance). Marriage is stable. He is retired. ACP - No CPR, No mechanical ventilation for more than short-term. No futile or heroic measures.      PHYSICAL EXAM:  VS: BP 136/72 (BP Location: Left Arm, Patient Position: Sitting, Cuff Size: Normal)   Pulse 62   Temp 98.6 F (37 C) (Oral)   Ht 5\' 6"  (1.676 m)   Wt 179 lb (81.2 kg)   SpO2 97%   BMI 28.89 kg/m  Physical Exam Gen: NAD, alert, cooperative with exam, well-appearing ENT: normal lips, normal nasal mucosa,  Eye: normal EOM, normal conjunctiva and lids CV:  no edema, +2 pedal pulses   Resp: no accessory muscle use,  non-labored,   Skin: no rashes, no areas of induration  Neuro: normal tone, normal sensation to touch Psych:  normal insight, alert and oriented MSK:  Right foot:  TTP of the midfoot longitudinal arch and plantar aspect of 1st MTP  Fat pad loss under the 1st MTP  No TTP of the plantar calcaneous  Some loss of transverse arch  Neurovascularly intact       ASSESSMENT & PLAN:   Right foot pain Pain seems to be related to the loss of this cushion on his plantar aspect. He could have a component of sesamoiditis - continue capsaicin cream  - ice and obtain a cushioned insole  - if no improvement would benefit from custom orthotics.

## 2017-09-05 ENCOUNTER — Telehealth: Payer: Self-pay | Admitting: Internal Medicine

## 2017-09-05 NOTE — Telephone Encounter (Signed)
We have 20 both days, pt can make appt with Mickel Baas or Raeford Razor

## 2017-09-05 NOTE — Telephone Encounter (Signed)
LM informing patient and asked for him to call back to schedule.

## 2017-09-05 NOTE — Telephone Encounter (Signed)
Copied from [place]. Topic: Appointment Scheduling - Scheduling Inquiry for Clinic >> Sep 05, 2017 10:29 AM Percell Belt A wrote:   Reason for CRM: Pt called in and stated that he is not feeling good and would like to see Dr Kemper Durie or wed of this week?  He would like to know if he could be worked in one of those days?

## 2017-09-12 ENCOUNTER — Ambulatory Visit: Payer: Medicare Other | Admitting: Internal Medicine

## 2017-09-12 ENCOUNTER — Encounter: Payer: Self-pay | Admitting: Internal Medicine

## 2017-09-12 DIAGNOSIS — R1084 Generalized abdominal pain: Secondary | ICD-10-CM | POA: Diagnosis not present

## 2017-09-12 DIAGNOSIS — R350 Frequency of micturition: Secondary | ICD-10-CM | POA: Diagnosis not present

## 2017-09-12 DIAGNOSIS — E538 Deficiency of other specified B group vitamins: Secondary | ICD-10-CM | POA: Diagnosis not present

## 2017-09-12 DIAGNOSIS — N401 Enlarged prostate with lower urinary tract symptoms: Secondary | ICD-10-CM | POA: Diagnosis not present

## 2017-09-12 DIAGNOSIS — R109 Unspecified abdominal pain: Secondary | ICD-10-CM | POA: Insufficient documentation

## 2017-09-12 MED ORDER — DOXAZOSIN MESYLATE 1 MG PO TABS: 1.0000 mg | ORAL_TABLET | Freq: Every day | ORAL | 11 refills | Status: DC

## 2017-09-12 NOTE — Progress Notes (Addendum)
Subjective:  Patient ID: Austin Torres, male    DOB: 1934/02/03  Age: 84 y.o. MRN: [id number]  CC: No chief complaint on file.   HPI Austin Torres presents for stress. C/o nocturia - 4-5/night. C/o incontinence when urgent.  Gemini is complaining of a diffuse abdominal pain of a few weeks-2 months duration.  Outpatient Medications Prior to Visit  Medication Sig Dispense Refill  . atenolol (TENORMIN) 50 MG tablet Take 1 tablet (50 mg total) by mouth 2 (two) times daily. 180 tablet 3  . clopidogrel (PLAVIX) 75 MG tablet TAKE 1 TABLET BY MOUTH DAILY 30 tablet 11  . Cyanocobalamin (VITAMIN B-12) 1000 MCG SUBL Place 1 tablet (1,000 mcg total) under the tongue daily. 100 tablet 3  . escitalopram (LEXAPRO) 20 MG tablet Take 1 tablet (20 mg total) by mouth daily. 30 tablet 11  . finasteride (PROSCAR) 5 MG tablet Take 1 tablet (5 mg total) by mouth daily. 90 tablet 3  . losartan (COZAAR) 100 MG tablet Take 1 tablet (100 mg total) by mouth daily. 90 tablet 3  . pantoprazole (PROTONIX) 40 MG tablet Take 1 tablet (40 mg total) by mouth daily. 30 tablet 11  . pravastatin (PRAVACHOL) 40 MG tablet Take 1 tablet (40 mg total) by mouth daily. 90 tablet 2  . predniSONE (DELTASONE) 5 MG tablet Take 6 pills for first day, 5 pills second day, 4 pills third day, 3 pills fourth day, 2 pills the fifth day, and 1 pill sixth day. 21 tablet 0  . tamsulosin (FLOMAX) 0.4 MG CAPS capsule Take 1 capsule (0.4 mg total) by mouth daily. 30 capsule 11  . traMADol (ULTRAM) 50 MG tablet TAKE 1/2-1 TABLET BY MOUTH EVERY 12 HOURS AS NEEDED FOR SEVERE PAIN 30 tablet 0  . triamcinolone ointment (KENALOG) 0.1 % Apply 1 application topically 2 (two) times daily. 30 g 0   No facility-administered medications prior to visit.     ROS Review of Systems  Constitutional: Negative for appetite change, fatigue and unexpected weight change.  HENT: Negative for congestion, nosebleeds, sneezing, sore throat and trouble swallowing.    Eyes: Negative for itching and visual disturbance.  Respiratory: Negative for cough.   Cardiovascular: Negative for chest pain, palpitations and leg swelling.  Gastrointestinal: Positive for abdominal distention and abdominal pain. Negative for blood in stool, diarrhea and nausea.  Genitourinary: Positive for frequency and urgency. Negative for hematuria.  Musculoskeletal: Negative for back pain, gait problem, joint swelling and neck pain.  Skin: Negative for rash.  Neurological: Negative for dizziness, tremors, speech difficulty and weakness.  Psychiatric/Behavioral: Positive for sleep disturbance. Negative for agitation, dysphoric mood and suicidal ideas. The patient is not nervous/anxious.     Objective:  BP (!) 152/72 (BP Location: Left Arm, Patient Position: Sitting, Cuff Size: Normal)   Pulse 95   Temp 97.7 F (36.5 C) (Oral)   Ht 5\' 6"  (1.676 m)   Wt 171 lb (77.6 kg)   SpO2 98%   BMI 27.60 kg/m   BP Readings from Last 3 Encounters:  09/12/17 (!) 152/72  09/01/17 136/72  08/12/17 126/72    Wt Readings from Last 3 Encounters:  09/12/17 171 lb (77.6 kg)  09/01/17 179 lb (81.2 kg)  08/12/17 175 lb (79.4 kg)    Physical Exam  Constitutional: He is oriented to person, place, and time. He appears well-developed. No distress.  NAD  HENT:  Mouth/Throat: Oropharynx is clear and moist.  Eyes: Pupils are equal, round, and reactive  to light. Conjunctivae are normal.  Neck: Normal range of motion. No JVD present. No thyromegaly present.  Cardiovascular: Normal rate, regular rhythm, normal heart sounds and intact distal pulses. Exam reveals no gallop and no friction rub.  No murmur heard. Pulmonary/Chest: Effort normal and breath sounds normal. No respiratory distress. He has no wheezes. He has no rales. He exhibits no tenderness.  Abdominal: Soft. Bowel sounds are normal. He exhibits no distension and no mass. There is no tenderness. There is no rebound and no guarding.    Musculoskeletal: Normal range of motion. He exhibits no edema or tenderness.  Lymphadenopathy:    He has no cervical adenopathy.  Neurological: He is alert and oriented to person, place, and time. He has normal reflexes. No cranial nerve deficit. He exhibits normal muscle tone. He displays a negative Romberg sign. Coordination and gait normal.  Skin: Skin is warm and dry. No rash noted.  Psychiatric: He has a normal mood and affect. His behavior is normal. Judgment and thought content normal.    Lab Results  Component Value Date   WBC 10.4 08/09/2017   HGB 13.5 08/09/2017   HCT 40.9 08/09/2017   PLT 190.0 08/09/2017   GLUCOSE 106 (H) 08/09/2017   CHOL 208 (H) 08/09/2017   TRIG 207.0 (H) 08/09/2017   HDL 46.50 08/09/2017   LDLDIRECT 136.0 08/09/2017   LDLCALC 96 07/29/2016   ALT 17 06/30/2016   AST 24 06/30/2016   NA 138 08/09/2017   K 4.7 08/09/2017   CL 102 08/09/2017   CREATININE 1.15 08/09/2017   BUN 20 08/09/2017   CO2 28 08/09/2017   TSH 3.94 08/09/2017   PSA 3.16 08/09/2017   INR 1.14 06/22/2017   HGBA1C 6.4 12/24/2016    No results found.  Assessment & Plan:   There are no diagnoses linked to this encounter. I am having Austin Torres maintain his Vitamin B-12, losartan, atenolol, pantoprazole, pravastatin, triamcinolone ointment, traMADol, tamsulosin, escitalopram, finasteride, clopidogrel, and predniSONE.  No orders of the defined types were placed in this encounter.    Follow-up: No follow-ups on file.  Walker Kehr, MD

## 2017-09-12 NOTE — Assessment & Plan Note (Signed)
Vit B12 

## 2017-09-12 NOTE — Patient Instructions (Addendum)
Stop Doxazosin if dizzy

## 2017-09-12 NOTE — Assessment & Plan Note (Signed)
Abd [place] GI ref if not better

## 2017-09-12 NOTE — Assessment & Plan Note (Signed)
Add Cardura F/u w/Dr Jeffie Pollock

## 2017-09-14 NOTE — Addendum Note (Signed)
Addended by: Karren Cobble on: 09/14/2017 10:47 AM   Modules accepted: Orders

## 2017-09-20 ENCOUNTER — Telehealth: Payer: Self-pay | Admitting: Internal Medicine

## 2017-09-20 ENCOUNTER — Ambulatory Visit
Admission: RE | Admit: 2017-09-20 | Discharge: 2017-09-20 | Disposition: A | Discharge status: Home / Self Care | Payer: Medicare Other | Source: Ambulatory Visit | Attending: Internal Medicine | Admitting: Internal Medicine

## 2017-09-20 DIAGNOSIS — R634 Abnormal weight loss: Secondary | ICD-10-CM

## 2017-09-20 DIAGNOSIS — C787 Secondary malignant neoplasm of liver and intrahepatic bile duct: Secondary | ICD-10-CM

## 2017-09-20 DIAGNOSIS — R1084 Generalized abdominal pain: Secondary | ICD-10-CM

## 2017-09-20 NOTE — Telephone Encounter (Signed)
I was calling the pt re:  Abd [place]: "Changes consistent with diffuse hepatic metastatic disease. Further workup by means of CT of the abdomen and pelvis with contrast is recommended. Chest CT may be helpful as well for further evaluation. Tissue sampling is also indicated."  No answer. I left a VM that I'll be ordering a CT scan to check on the liver spots detected by [place]. I left my #

## 2017-09-21 NOTE — Telephone Encounter (Signed)
I spoke w/pt this am CTs ordered

## 2017-09-22 NOTE — Telephone Encounter (Signed)
Spoke to pt and he is wondering if the blood in urine and problems he is having with his prostate could be connected with whats going on with his liver?  Please advise pt

## 2017-09-22 NOTE — Telephone Encounter (Signed)
Please advise 

## 2017-09-24 NOTE — Telephone Encounter (Signed)
I spoke w/pt: we need to get  CT scan Thx

## 2017-09-26 ENCOUNTER — Other Ambulatory Visit (INDEPENDENT_AMBULATORY_CARE_PROVIDER_SITE_OTHER): Payer: Medicare Other

## 2017-09-26 DIAGNOSIS — R972 Elevated prostate specific antigen [PSA]: Secondary | ICD-10-CM

## 2017-09-26 DIAGNOSIS — R350 Frequency of micturition: Secondary | ICD-10-CM | POA: Diagnosis not present

## 2017-09-26 DIAGNOSIS — N401 Enlarged prostate with lower urinary tract symptoms: Secondary | ICD-10-CM

## 2017-09-26 LAB — BASIC METABOLIC PANEL
BUN: 21 mg/dL (ref 6–23)
CALCIUM: 9.2 mg/dL (ref 8.4–10.5)
CO2: 23 mEq/L (ref 19–32)
CREATININE: 1.34 mg/dL (ref 0.40–1.50)
Chloride: 98 mEq/L (ref 96–112)
GFR: 54.29 mL/min — AB (ref >60.00)
GLUCOSE: 219 mg/dL — AB (ref 70–99)
POTASSIUM: 4.1 meq/L (ref 3.5–5.1)
Sodium: 135 mEq/L (ref 135–145)

## 2017-09-26 LAB — PSA: PSA: 2.29 ng/mL (ref 0.10–4.00)

## 2017-09-28 ENCOUNTER — Ambulatory Visit (INDEPENDENT_AMBULATORY_CARE_PROVIDER_SITE_OTHER)
Admission: RE | Admit: 2017-09-28 | Discharge: 2017-09-28 | Disposition: A | Discharge status: Home / Self Care | Payer: Medicare Other | Source: Ambulatory Visit | Attending: Internal Medicine | Admitting: Internal Medicine

## 2017-09-28 DIAGNOSIS — C259 Malignant neoplasm of pancreas, unspecified: Secondary | ICD-10-CM | POA: Diagnosis not present

## 2017-09-28 DIAGNOSIS — R634 Abnormal weight loss: Secondary | ICD-10-CM

## 2017-09-28 DIAGNOSIS — C787 Secondary malignant neoplasm of liver and intrahepatic bile duct: Secondary | ICD-10-CM | POA: Diagnosis not present

## 2017-09-28 MED ORDER — IOPAMIDOL (ISOVUE-300) INJECTION 61%
100.0000 mL | Freq: Once | INTRAVENOUS | Status: AC | PRN
  Administered 2017-09-28: 100 mL via INTRAVENOUS

## 2017-09-29 ENCOUNTER — Other Ambulatory Visit: Payer: Self-pay

## 2017-09-29 ENCOUNTER — Inpatient Hospital Stay (HOSPITAL_COMMUNITY)
Admission: EM | Admit: 2017-09-29 | Discharge: 2017-10-05 | DRG: 982 | Disposition: A | Discharge status: Home / Self Care | Payer: Medicare Other | Attending: Internal Medicine | Admitting: Internal Medicine

## 2017-09-29 ENCOUNTER — Encounter (HOSPITAL_COMMUNITY): Payer: Self-pay | Admitting: Emergency Medicine

## 2017-09-29 DIAGNOSIS — F17290 Nicotine dependence, other tobacco product, uncomplicated: Secondary | ICD-10-CM | POA: Diagnosis present

## 2017-09-29 DIAGNOSIS — H409 Unspecified glaucoma: Secondary | ICD-10-CM | POA: Diagnosis present

## 2017-09-29 DIAGNOSIS — I82B11 Acute embolism and thrombosis of right subclavian vein: Secondary | ICD-10-CM | POA: Diagnosis not present

## 2017-09-29 DIAGNOSIS — E291 Testicular hypofunction: Secondary | ICD-10-CM | POA: Diagnosis present

## 2017-09-29 DIAGNOSIS — J449 Chronic obstructive pulmonary disease, unspecified: Secondary | ICD-10-CM | POA: Diagnosis not present

## 2017-09-29 DIAGNOSIS — N400 Enlarged prostate without lower urinary tract symptoms: Secondary | ICD-10-CM | POA: Diagnosis present

## 2017-09-29 DIAGNOSIS — C259 Malignant neoplasm of pancreas, unspecified: Secondary | ICD-10-CM | POA: Diagnosis not present

## 2017-09-29 DIAGNOSIS — N401 Enlarged prostate with lower urinary tract symptoms: Secondary | ICD-10-CM | POA: Diagnosis not present

## 2017-09-29 DIAGNOSIS — I4510 Unspecified right bundle-branch block: Secondary | ICD-10-CM | POA: Diagnosis present

## 2017-09-29 DIAGNOSIS — Z8249 Family history of ischemic heart disease and other diseases of the circulatory system: Secondary | ICD-10-CM | POA: Diagnosis not present

## 2017-09-29 DIAGNOSIS — C252 Malignant neoplasm of tail of pancreas: Secondary | ICD-10-CM

## 2017-09-29 DIAGNOSIS — Z8673 Personal history of transient ischemic attack (TIA), and cerebral infarction without residual deficits: Secondary | ICD-10-CM | POA: Diagnosis not present

## 2017-09-29 DIAGNOSIS — Z79891 Long term (current) use of opiate analgesic: Secondary | ICD-10-CM

## 2017-09-29 DIAGNOSIS — Z66 Do not resuscitate: Secondary | ICD-10-CM | POA: Diagnosis not present

## 2017-09-29 DIAGNOSIS — I340 Nonrheumatic mitral (valve) insufficiency: Secondary | ICD-10-CM | POA: Diagnosis not present

## 2017-09-29 DIAGNOSIS — I82411 Acute embolism and thrombosis of right femoral vein: Secondary | ICD-10-CM | POA: Diagnosis present

## 2017-09-29 DIAGNOSIS — E785 Hyperlipidemia, unspecified: Secondary | ICD-10-CM | POA: Diagnosis not present

## 2017-09-29 DIAGNOSIS — D649 Anemia, unspecified: Secondary | ICD-10-CM | POA: Diagnosis present

## 2017-09-29 DIAGNOSIS — Z7952 Long term (current) use of systemic steroids: Secondary | ICD-10-CM

## 2017-09-29 DIAGNOSIS — I82442 Acute embolism and thrombosis of left tibial vein: Secondary | ICD-10-CM | POA: Diagnosis present

## 2017-09-29 DIAGNOSIS — Z7902 Long term (current) use of antithrombotics/antiplatelets: Secondary | ICD-10-CM

## 2017-09-29 DIAGNOSIS — N179 Acute kidney failure, unspecified: Secondary | ICD-10-CM | POA: Diagnosis not present

## 2017-09-29 DIAGNOSIS — K869 Disease of pancreas, unspecified: Secondary | ICD-10-CM | POA: Diagnosis not present

## 2017-09-29 DIAGNOSIS — R911 Solitary pulmonary nodule: Secondary | ICD-10-CM | POA: Diagnosis not present

## 2017-09-29 DIAGNOSIS — R0682 Tachypnea, not elsewhere classified: Secondary | ICD-10-CM | POA: Diagnosis present

## 2017-09-29 DIAGNOSIS — I2699 Other pulmonary embolism without acute cor pulmonale: Principal | ICD-10-CM | POA: Diagnosis present

## 2017-09-29 DIAGNOSIS — M7989 Other specified soft tissue disorders: Secondary | ICD-10-CM | POA: Diagnosis not present

## 2017-09-29 DIAGNOSIS — Z5111 Encounter for antineoplastic chemotherapy: Secondary | ICD-10-CM | POA: Diagnosis not present

## 2017-09-29 DIAGNOSIS — Z888 Allergy status to other drugs, medicaments and biological substances status: Secondary | ICD-10-CM | POA: Diagnosis not present

## 2017-09-29 DIAGNOSIS — R160 Hepatomegaly, not elsewhere classified: Secondary | ICD-10-CM | POA: Diagnosis not present

## 2017-09-29 DIAGNOSIS — C801 Malignant (primary) neoplasm, unspecified: Secondary | ICD-10-CM | POA: Diagnosis not present

## 2017-09-29 DIAGNOSIS — I2609 Other pulmonary embolism with acute cor pulmonale: Secondary | ICD-10-CM | POA: Diagnosis not present

## 2017-09-29 DIAGNOSIS — Z85828 Personal history of other malignant neoplasm of skin: Secondary | ICD-10-CM | POA: Diagnosis not present

## 2017-09-29 DIAGNOSIS — R350 Frequency of micturition: Secondary | ICD-10-CM | POA: Diagnosis not present

## 2017-09-29 DIAGNOSIS — C229 Malignant neoplasm of liver, not specified as primary or secondary: Secondary | ICD-10-CM | POA: Diagnosis not present

## 2017-09-29 DIAGNOSIS — C787 Secondary malignant neoplasm of liver and intrahepatic bile duct: Secondary | ICD-10-CM | POA: Diagnosis present

## 2017-09-29 DIAGNOSIS — K219 Gastro-esophageal reflux disease without esophagitis: Secondary | ICD-10-CM | POA: Diagnosis present

## 2017-09-29 DIAGNOSIS — E538 Deficiency of other specified B group vitamins: Secondary | ICD-10-CM | POA: Diagnosis present

## 2017-09-29 DIAGNOSIS — E559 Vitamin D deficiency, unspecified: Secondary | ICD-10-CM | POA: Diagnosis present

## 2017-09-29 DIAGNOSIS — D509 Iron deficiency anemia, unspecified: Secondary | ICD-10-CM | POA: Diagnosis not present

## 2017-09-29 DIAGNOSIS — I639 Cerebral infarction, unspecified: Secondary | ICD-10-CM | POA: Diagnosis not present

## 2017-09-29 DIAGNOSIS — M19019 Primary osteoarthritis, unspecified shoulder: Secondary | ICD-10-CM | POA: Diagnosis not present

## 2017-09-29 DIAGNOSIS — R109 Unspecified abdominal pain: Secondary | ICD-10-CM | POA: Diagnosis present

## 2017-09-29 DIAGNOSIS — K7689 Other specified diseases of liver: Secondary | ICD-10-CM | POA: Diagnosis not present

## 2017-09-29 DIAGNOSIS — I351 Nonrheumatic aortic (valve) insufficiency: Secondary | ICD-10-CM | POA: Diagnosis not present

## 2017-09-29 DIAGNOSIS — I10 Essential (primary) hypertension: Secondary | ICD-10-CM | POA: Diagnosis present

## 2017-09-29 DIAGNOSIS — Z7189 Other specified counseling: Secondary | ICD-10-CM

## 2017-09-29 DIAGNOSIS — I749 Embolism and thrombosis of unspecified artery: Secondary | ICD-10-CM | POA: Diagnosis not present

## 2017-09-29 DIAGNOSIS — I2602 Saddle embolus of pulmonary artery with acute cor pulmonale: Secondary | ICD-10-CM | POA: Diagnosis not present

## 2017-09-29 DIAGNOSIS — Z79899 Other long term (current) drug therapy: Secondary | ICD-10-CM | POA: Diagnosis not present

## 2017-09-29 DIAGNOSIS — R0602 Shortness of breath: Secondary | ICD-10-CM | POA: Diagnosis not present

## 2017-09-29 DIAGNOSIS — I82403 Acute embolism and thrombosis of unspecified deep veins of lower extremity, bilateral: Secondary | ICD-10-CM | POA: Diagnosis not present

## 2017-09-29 HISTORY — DX: Malignant neoplasm of pancreas, unspecified: C25.9

## 2017-09-29 HISTORY — DX: Secondary malignant neoplasm of liver and intrahepatic bile duct: C78.7

## 2017-09-29 LAB — I-STAT TROPONIN, ED: Troponin i, poc: 0.1 ng/mL (ref 0.00–0.08)

## 2017-09-29 LAB — COMPREHENSIVE METABOLIC PANEL
ALT: 34 U/L (ref 17–63)
AST: 47 U/L — ABNORMAL HIGH (ref 15–41)
Albumin: 3.4 g/dL — ABNORMAL LOW (ref 3.5–5.0)
Alkaline Phosphatase: 331 U/L — ABNORMAL HIGH (ref 38–126)
Anion gap: 14 (ref 5–15)
BUN: 16 mg/dL (ref 6–20)
CO2: 21 mmol/L — ABNORMAL LOW (ref 22–32)
Calcium: 9 mg/dL (ref 8.9–10.3)
Chloride: 99 mmol/L — ABNORMAL LOW (ref 101–111)
Creatinine, Ser: 1.19 mg/dL (ref 0.61–1.24)
GFR calc Af Amer: >60 mL/min (ref >60)
GFR calc non Af Amer: 55 mL/min — ABNORMAL LOW (ref >60)
Glucose, Bld: 215 mg/dL — ABNORMAL HIGH (ref 65–99)
Potassium: 4 mmol/L (ref 3.5–5.1)
Sodium: 134 mmol/L — ABNORMAL LOW (ref 135–145)
Total Bilirubin: 1.2 mg/dL (ref 0.3–1.2)
Total Protein: 6.6 g/dL (ref 6.5–8.1)

## 2017-09-29 LAB — I-STAT CG4 LACTIC ACID, ED: Lactic Acid, Venous: 2.61 mmol/L (ref 0.5–1.9)

## 2017-09-29 LAB — CBC
HEMATOCRIT: 40.5 % (ref 39.0–52.0)
Hemoglobin: 13.1 g/dL (ref 13.0–17.0)
MCH: 27.3 pg (ref 26.0–34.0)
MCHC: 32.3 g/dL (ref 30.0–36.0)
MCV: 84.4 fL (ref 78.0–100.0)
PLATELETS: 164 10*3/uL (ref 150–400)
RBC: 4.8 MIL/uL (ref 4.22–5.81)
RDW: 14.5 % (ref 11.5–15.5)
WBC: 12.3 10*3/uL — ABNORMAL HIGH (ref 4.0–10.5)

## 2017-09-29 LAB — MRSA PCR SCREENING: MRSA by PCR: NEGATIVE

## 2017-09-29 LAB — HEPARIN LEVEL (UNFRACTIONATED): Heparin Unfractionated: 0.36 IU/mL (ref 0.30–0.70)

## 2017-09-29 LAB — BRAIN NATRIURETIC PEPTIDE: B Natriuretic Peptide: 80.2 pg/mL (ref 0.0–100.0)

## 2017-09-29 MED ORDER — PANTOPRAZOLE SODIUM 40 MG PO TBEC
40.0000 mg | DELAYED_RELEASE_TABLET | Freq: Every day | ORAL | Status: DC
  Administered 2017-09-30 – 2017-10-05 (×6): 40 mg via ORAL
  Filled 2017-09-29 (×6): qty 1

## 2017-09-29 MED ORDER — FINASTERIDE 5 MG PO TABS
5.0000 mg | ORAL_TABLET | Freq: Every day | ORAL | Status: DC
  Administered 2017-09-30 – 2017-10-05 (×6): 5 mg via ORAL
  Filled 2017-09-29 (×6): qty 1

## 2017-09-29 MED ORDER — ESCITALOPRAM OXALATE 20 MG PO TABS
20.0000 mg | ORAL_TABLET | Freq: Every day | ORAL | Status: DC
  Administered 2017-09-30 – 2017-10-05 (×6): 20 mg via ORAL
  Filled 2017-09-29 (×6): qty 1

## 2017-09-29 MED ORDER — ACETAMINOPHEN 650 MG RE SUPP: 650.0000 mg | Freq: Four times a day (QID) | RECTAL | Status: DC | PRN

## 2017-09-29 MED ORDER — DOXAZOSIN MESYLATE 1 MG PO TABS: 1.0000 mg | ORAL_TABLET | Freq: Every day | ORAL | Status: DC

## 2017-09-29 MED ORDER — PRAVASTATIN SODIUM 40 MG PO TABS
40.0000 mg | ORAL_TABLET | Freq: Every day | ORAL | Status: DC
  Administered 2017-09-30 – 2017-10-02 (×3): 40 mg via ORAL
  Filled 2017-09-29 (×3): qty 1

## 2017-09-29 MED ORDER — TAMSULOSIN HCL 0.4 MG PO CAPS
0.4000 mg | ORAL_CAPSULE | Freq: Every day | ORAL | Status: DC
  Administered 2017-09-30 – 2017-10-05 (×6): 0.4 mg via ORAL
  Filled 2017-09-29 (×6): qty 1

## 2017-09-29 MED ORDER — SODIUM CHLORIDE 0.9 % IV SOLN
INTRAVENOUS | Status: DC
  Administered 2017-09-29 – 2017-10-04 (×4): via INTRAVENOUS

## 2017-09-29 MED ORDER — ONDANSETRON HCL 4 MG/2ML IJ SOLN: 4.0000 mg | Freq: Four times a day (QID) | INTRAMUSCULAR | Status: DC | PRN

## 2017-09-29 MED ORDER — VITAMIN B-12 1000 MCG PO TABS
1000.0000 ug | ORAL_TABLET | Freq: Every day | ORAL | Status: DC
  Administered 2017-09-30 – 2017-10-05 (×6): 1000 ug via ORAL
  Filled 2017-09-29 (×6): qty 1

## 2017-09-29 MED ORDER — ATENOLOL 25 MG PO TABS
50.0000 mg | ORAL_TABLET | Freq: Two times a day (BID) | ORAL | Status: DC
  Administered 2017-09-29 – 2017-10-05 (×12): 50 mg via ORAL
  Filled 2017-09-29 (×12): qty 2

## 2017-09-29 MED ORDER — LOSARTAN POTASSIUM 50 MG PO TABS
100.0000 mg | ORAL_TABLET | Freq: Every day | ORAL | Status: DC
  Administered 2017-09-30 – 2017-10-03 (×4): 100 mg via ORAL
  Filled 2017-09-29 (×4): qty 2

## 2017-09-29 MED ORDER — ONDANSETRON HCL 4 MG PO TABS: 4.0000 mg | ORAL_TABLET | Freq: Four times a day (QID) | ORAL | Status: DC | PRN

## 2017-09-29 MED ORDER — ACETAMINOPHEN 325 MG PO TABS
650.0000 mg | ORAL_TABLET | Freq: Four times a day (QID) | ORAL | Status: DC | PRN
  Administered 2017-10-04: 650 mg via ORAL
  Filled 2017-09-29: qty 2

## 2017-09-29 MED ORDER — HEPARIN BOLUS VIA INFUSION
5400.0000 [IU] | Freq: Once | INTRAVENOUS | Status: AC
  Administered 2017-09-29: 5400 [IU] via INTRAVENOUS
  Filled 2017-09-29: qty 5400

## 2017-09-29 MED ORDER — HEPARIN (PORCINE) IN NACL 100-0.45 UNIT/ML-% IJ SOLN
1450.0000 [IU]/h | INTRAMUSCULAR | Status: DC
  Administered 2017-09-29 (×2): 1350 [IU]/h via INTRAVENOUS
  Administered 2017-09-30: 1450 [IU]/h via INTRAVENOUS
  Administered 2017-10-01 – 2017-10-02 (×2): 1500 [IU]/h via INTRAVENOUS
  Administered 2017-10-04: 1450 [IU]/h via INTRAVENOUS
  Filled 2017-09-29 (×7): qty 250

## 2017-09-29 NOTE — H&P (Signed)
History and Physical    Austin Torres MWU:[id number] DOB: 10-Oct-1933 DOA: 09/29/2017  PCP: Cassandria Anger, MD Patient coming from: home  Chief Complaint: sob  HPI: Austin Torres is a very pleasant 84 y.o. male with medical history significant hypertension, hyperlipidemia, BPH, GERD,B-12 deficiency,liver metastasis presents to emergency Department chief complaint persistent shortness of breath and abnormal outpatient CT. Outpatient CT reveals pulmonary embolism. [facility] are asked to admit  Information is obtained from the patient. Patient reports a four-week history of worsening shortness of breath. He states he's also in the process of being worked up for some "spots on my liver". He states last week he noticed worsening dyspnea with exertion to the point that he could not climb the stairs in his home. His home medications do include Plavix and baby aspirin. He denies headache dizziness syncope or near-syncope. He denies fever chills abdominal pain nausea or vomiting. He denies chest pain palpitations lower extremity edema or orthopnea. He denies fever chills cough. He denies dysuria hematuria frequency or urgency.    ED Course: in the emergency department he's afebrile hemodynamically stable and not hypoxic.e is provided with a heparin drip in the emergency department.  Review of Systems: As per HPI otherwise all other systems reviewed and are negative.   Ambulatory Status: ambulates independently and is independent with ADLs  Past Medical History:  Diagnosis Date  . B12 deficiency   . BPH (benign prostatic hypertrophy)   . GERD (gastroesophageal reflux disease)   . Hyperlipidemia   . Hypertension   . Hypogonadism male   . Skin cancer     Past Surgical History:  Procedure Laterality Date  . APPENDECTOMY    . EYE SURGERY     glaucoma procedure -both eyes  . skin cancer excision     scalp  . TONSILLECTOMY      Social History   Socioeconomic History  .  Marital status: Married    Spouse name: Not on file  . Number of children: 3  . Years of education: 34  . Highest education level: Not on file  Occupational History    Employer: RETIRED  Social Needs  . Financial resource strain: Not on file  . Food insecurity:    Worry: Not on file    Inability: Not on file  . Transportation needs:    Medical: Not on file    Non-medical: Not on file  Tobacco Use  . Smoking status: Current Every Day Smoker    Packs/day: 1.00    Years: 25.00    Pack years: 25.00    Types: Cigars  . Smokeless tobacco: Never Used  Substance and Sexual Activity  . Alcohol use: Yes    Alcohol/week: 9.0 oz    Types: 15 Standard drinks or equivalent per week  . Drug use: No  . Sexual activity: Yes    Partners: Female  Lifestyle  . Physical activity:    Days per week: Not on file    Minutes per session: Not on file  . Stress: Not on file  Relationships  . Social connections:    Talks on phone: Not on file    Gets together: Not on file    Attends religious service: Not on file    Active member of club or organization: Not on file    Attends meetings of clubs or organizations: Not on file    Relationship status: Not on file  . Intimate partner violence:    Fear of current  or ex partner: Not on file    Emotionally abused: Not on file    Physically abused: Not on file    Forced sexual activity: Not on file  Other Topics Concern  . Not on file  Social History Narrative   HSG, [employer] [facility] [place]. Married '61 2 dtrs - '68, 69; 2 sons - '72, '78. 4 grandchildren.   Work - Magazine features editor- Engineer, maintenance). Marriage is stable. He is retired. ACP - No CPR, No mechanical ventilation for more than short-term. No futile or heroic measures.     Allergies  Allergen Reactions  . Cardura [Doxazosin Mesylate] Other (See Comments)    dizziness    Family History  Problem Relation Age of Onset  . Heart disease Father   . Heart disease Brother         CAD/MI  . Cancer Neg Hx        no colon cancer, no prostate cancer  . Colon cancer Neg Hx   . Esophageal cancer Neg Hx   . Rectal cancer Neg Hx   . Stomach cancer Neg Hx     Prior to Admission medications   Medication Sig Start Date End Date Taking? Authorizing Provider  atenolol (TENORMIN) 50 MG tablet Take 1 tablet (50 mg total) by mouth 2 (two) times daily. 05/26/16  Yes Plotnikov, Evie Lacks, MD  clopidogrel (PLAVIX) 75 MG tablet TAKE 1 TABLET BY MOUTH DAILY 08/16/17  Yes Plotnikov, Evie Lacks, MD  Cyanocobalamin (VITAMIN B-12) 1000 MCG SUBL Place 1 tablet (1,000 mcg total) under the tongue daily. 11/28/15  Yes Plotnikov, Evie Lacks, MD  doxazosin (CARDURA) 1 MG tablet Take 1 tablet (1 mg total) by mouth at bedtime. 09/12/17  Yes Plotnikov, Evie Lacks, MD  escitalopram (LEXAPRO) 20 MG tablet Take 1 tablet (20 mg total) by mouth daily. 06/27/17  Yes Plotnikov, Evie Lacks, MD  finasteride (PROSCAR) 5 MG tablet Take 1 tablet (5 mg total) by mouth daily. 08/12/17 08/12/18 Yes Plotnikov, Evie Lacks, MD  losartan (COZAAR) 100 MG tablet Take 1 tablet (100 mg total) by mouth daily. 05/26/16  Yes Plotnikov, Evie Lacks, MD  pantoprazole (PROTONIX) 40 MG tablet Take 1 tablet (40 mg total) by mouth daily. 07/30/16  Yes Plotnikov, Evie Lacks, MD  pravastatin (PRAVACHOL) 40 MG tablet Take 1 tablet (40 mg total) by mouth daily. 09/21/16  Yes Plotnikov, Evie Lacks, MD  tamsulosin (FLOMAX) 0.4 MG CAPS capsule Take 1 capsule (0.4 mg total) by mouth daily. 06/27/17  Yes Plotnikov, Evie Lacks, MD  traMADol (ULTRAM) 50 MG tablet TAKE 1/2-1 TABLET BY MOUTH EVERY 12 HOURS AS NEEDED FOR SEVERE PAIN 06/02/17  Yes Rosemarie Ax, MD  triamcinolone ointment (KENALOG) 0.1 % Apply 1 application topically 2 (two) times daily. 12/24/16  Yes Plotnikov, Evie Lacks, MD    Physical Exam: Vitals:   09/29/17 1415 09/29/17 1430 09/29/17 1530 09/29/17 1545  BP:  130/78    Pulse: 75 76 82 85  Resp: (!) 25 12 (!) 22 (!) 21  Temp:        TempSrc:      SpO2: 95% 95% 95% 98%  Weight:      Height:         General:  Appears calm and comfortable  Eyes:  PERRL, EOMI, normal lids, iris ENT:  grossly normal hearing, lips & tongue, mucous membranes are moist and pink Neck:  no LAD, masses or thyromegaly Cardiovascular:  RRR, no m/r/g. No LE edema.  Respiratory:  CTA bilaterally, no w/r/r. Normal respiratory effort. Abdomen:  soft, ntnd, positive bowel sounds throughout no guarding or rebounding Skin:  no rash or induration seen on limited exam Musculoskeletal:  grossly normal tone BUE/BLE, good ROM, no bony abnormality Psychiatric:  grossly normal mood and affect, speech fluent and appropriate, AOx3 Neurologic:  CN 2-12 grossly intact, moves all extremities in coordinated fashion, sensation intact  Labs on Admission: I have personally reviewed following labs and imaging studies  CBC: Recent Labs  Lab 09/29/17 1207  WBC 12.3*  HGB 13.1  HCT 40.5  MCV 84.4  PLT 409   Basic Metabolic Panel: Recent Labs  Lab 09/26/17 1350 09/29/17 1207  NA 135 134*  K 4.1 4.0  CL 98 99*  CO2 23 21*  GLUCOSE 219* 215*  BUN 21 16  CREATININE 1.34 1.19  CALCIUM 9.2 9.0   GFR: Estimated Creatinine Clearance: 47.6 mL/min (by C-G formula based on SCr of 1.19 mg/dL). Liver Function Tests: Recent Labs  Lab 09/29/17 1207  AST 47*  ALT 34  ALKPHOS 331*  BILITOT 1.2  PROT 6.6  ALBUMIN 3.4*   No results for input(s): LIPASE, AMYLASE in the last 168 hours. No results for input(s): AMMONIA in the last 168 hours. Coagulation Profile: No results for input(s): INR, PROTIME in the last 168 hours. Cardiac Enzymes: No results for input(s): CKTOTAL, CKMB, CKMBINDEX, TROPONINI in the last 168 hours. BNP (last 3 results) No results for input(s): PROBNP in the last 8760 hours. HbA1C: No results for input(s): HGBA1C in the last 72 hours. CBG: No results for input(s): GLUCAP in the last 168 hours. Lipid Profile: No results for  input(s): CHOL, HDL, LDLCALC, TRIG, CHOLHDL, LDLDIRECT in the last 72 hours. Thyroid Function Tests: No results for input(s): TSH, T4TOTAL, FREET4, T3FREE, THYROIDAB in the last 72 hours. Anemia Panel: No results for input(s): VITAMINB12, FOLATE, FERRITIN, TIBC, IRON, RETICCTPCT in the last 72 hours. Urine analysis:    Component Value Date/Time   COLORURINE YELLOW 02/14/2017 1141   APPEARANCEUR CLEAR 02/14/2017 1141   LABSPEC <=1.005 (A) 02/14/2017 1141   PHURINE 7.0 02/14/2017 1141   GLUCOSEU NEGATIVE 02/14/2017 1141   HGBUR NEGATIVE 02/14/2017 1141   BILIRUBINUR NEGATIVE 02/14/2017 1141   KETONESUR NEGATIVE 02/14/2017 1141   PROTEINUR NEGATIVE 06/29/2016 2150   UROBILINOGEN 0.2 02/14/2017 1141   NITRITE NEGATIVE 02/14/2017 1141   LEUKOCYTESUR NEGATIVE 02/14/2017 1141    Creatinine Clearance: Estimated Creatinine Clearance: 47.6 mL/min (by C-G formula based on SCr of 1.19 mg/dL).  Sepsis Labs: @LABRCNTIP (procalcitonin:4,lacticidven:4) )No results found for this or any previous visit (from the past 240 hour(s)).   Radiological Exams on Admission: Ct Abdomen Pelvis W Wo Contrast  Result Date: 09/29/2017 CLINICAL DATA:  Right upper quadrant abdominal pain, weight loss, and anorexia. Hepatic masses on recent ultrasound. EXAM: CT CHEST WITH CONTRAST CT ABDOMEN AND PELVIS WITH AND WITHOUT CONTRAST TECHNIQUE: Multidetector CT imaging of the chest was performed during intravenous contrast administration. Multidetector CT imaging of the abdomen and pelvis was performed following the standard protocol before and during bolus administration of intravenous contrast. CONTRAST:  158mL ISOVUE-300 IOPAMIDOL (ISOVUE-300) INJECTION 61% COMPARISON:  Chest CT on 10/04/2016 and AP CT on 04/05/2016 FINDINGS: CT CHEST FINDINGS Cardiovascular: Pulmonary embolism is seen in both the right and left pulmonary arteries and old bilateral lobar branches. No saddle embolus seen. RV/LV ratio is 0.98, consistent  with right heart strain. Thrombosis of the right subclavian vein is seen with adjacent soft tissue edema. Mediastinum/Lymph  Nodes: No masses or pathologically enlarged lymph nodes identified. Lungs/Pleura: 7 mm pulmonary nodule in the posterior right lower lobe and 5 mm pulmonary nodule in the superior left lower lobe are stable since previous studies, consistent with benign etiology. No suspicious pulmonary nodules or masses identified. No evidence of pulmonary infiltrate or pleural effusion. Musculoskeletal:  No suspicious bone lesions identified. CT ABDOMEN AND PELVIS FINDINGS Hepatobiliary: Numerous rim enhancing hypovascular masses are seen throughout the right and left hepatic lobes which are new since previous study, consistent with diffuse liver metastases. Largest mass in the medial segment left lobe measures 7.1 x 5.5 cm on image 19/4. Gallbladder is unremarkable. No evidence of biliary ductal dilatation. Pancreas: A new hypovascular mass is seen in the pancreatic tail which measures 5.4 x 2.5 cm on image 38/4, consistent with pancreatic adenocarcinoma. Spleen:  Within normal limits in size and appearance. Adrenals/Urinary tract: Normal appearance adrenal glands and right kidney. Small cyst again seen in upper pole of left kidney. A new 2 cm peripheral low-attenuation lesion is seen in the lower pole the left kidney which has ill-defined margins. This could be due to pyelonephritis, infarct, or neoplasm. No evidence of hydronephrosis. Mild diffuse bladder wall thickening is seen, likely due to chronic bladder outlet obstruction. Stomach/Bowel: Small hiatal hernia noted. No evidence of obstruction, inflammatory process, or abnormal fluid collections. Diverticulosis is seen, without evidence of diverticulitis. Vascular/Lymphatic: No pathologically enlarged lymph nodes identified. No abdominal aortic aneurysm. Aortic atherosclerosis. Reproductive: Mildly enlarged prostate gland with indentation of bladder  base. Other:  None. Musculoskeletal:  No suspicious bone lesions identified. IMPRESSION: 5.4 cm mass in pancreatic tail, consistent with pancreatic adenocarcinoma. Diffuse liver metastases. No evidence of metastatic disease within chest or pelvis. New 2 cm ill-defined low-attenuation lesion in lower pole of left kidney, which is indeterminate. Differential diagnosis includes pyelonephritis, infarct, and neoplasm. Suggest correlation with urinalysis and continued attention on follow-up CT. 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 Acute bilateral pulmonary embolism, with CT evidence of right heart strain (RV/LV Ratio = 0.98) consistent with at least submassive (intermediate risk) PE. The presence of right heart strain has been associated with an increased risk of morbidity and mortality. Please activate Code PE by paging [phone]. Critical Value/emergent results were called by telephone at the time of interpretation on 09/29/2017 at 9:54 am to Dr. Lew Dawes , who verbally acknowledged these results. Electronically Signed   By: Earle Gell M.D.   On: 09/29/2017 09:58   Ct Chest W Contrast  Result Date: 09/29/2017 CLINICAL DATA:  Right upper quadrant abdominal pain, weight loss, and anorexia. Hepatic masses on recent ultrasound. EXAM: CT CHEST WITH CONTRAST CT ABDOMEN AND PELVIS WITH AND WITHOUT CONTRAST TECHNIQUE: Multidetector CT imaging of the chest was performed during intravenous contrast administration. Multidetector CT imaging of the abdomen and pelvis was performed following the standard protocol before and during bolus administration of intravenous contrast. CONTRAST:  130mL ISOVUE-300 IOPAMIDOL (ISOVUE-300) INJECTION 61% COMPARISON:  Chest CT on 10/04/2016 and AP CT on 04/05/2016 FINDINGS: CT CHEST FINDINGS Cardiovascular: Pulmonary embolism is seen in both the right and left pulmonary arteries and old bilateral lobar branches. No saddle embolus seen. RV/LV ratio is 0.98, consistent with  right heart strain. Thrombosis of the right subclavian vein is seen with adjacent soft tissue edema. Mediastinum/Lymph Nodes: No masses or pathologically enlarged lymph nodes identified. Lungs/Pleura: 7 mm pulmonary nodule in the posterior right lower lobe and 5 mm pulmonary nodule in the superior left lower lobe are stable  since previous studies, consistent with benign etiology. No suspicious pulmonary nodules or masses identified. No evidence of pulmonary infiltrate or pleural effusion. Musculoskeletal:  No suspicious bone lesions identified. CT ABDOMEN AND PELVIS FINDINGS Hepatobiliary: Numerous rim enhancing hypovascular masses are seen throughout the right and left hepatic lobes which are new since previous study, consistent with diffuse liver metastases. Largest mass in the medial segment left lobe measures 7.1 x 5.5 cm on image 19/4. Gallbladder is unremarkable. No evidence of biliary ductal dilatation. Pancreas: A new hypovascular mass is seen in the pancreatic tail which measures 5.4 x 2.5 cm on image 38/4, consistent with pancreatic adenocarcinoma. Spleen:  Within normal limits in size and appearance. Adrenals/Urinary tract: Normal appearance adrenal glands and right kidney. Small cyst again seen in upper pole of left kidney. A new 2 cm peripheral low-attenuation lesion is seen in the lower pole the left kidney which has ill-defined margins. This could be due to pyelonephritis, infarct, or neoplasm. No evidence of hydronephrosis. Mild diffuse bladder wall thickening is seen, likely due to chronic bladder outlet obstruction. Stomach/Bowel: Small hiatal hernia noted. No evidence of obstruction, inflammatory process, or abnormal fluid collections. Diverticulosis is seen, without evidence of diverticulitis. Vascular/Lymphatic: No pathologically enlarged lymph nodes identified. No abdominal aortic aneurysm. Aortic atherosclerosis. Reproductive: Mildly enlarged prostate gland with indentation of bladder base.  Other:  None. Musculoskeletal:  No suspicious bone lesions identified. IMPRESSION: 5.4 cm mass in pancreatic tail, consistent with pancreatic adenocarcinoma. Diffuse liver metastases. No evidence of metastatic disease within chest or pelvis. New 2 cm ill-defined low-attenuation lesion in lower pole of left kidney, which is indeterminate. Differential diagnosis includes pyelonephritis, infarct, and neoplasm. Suggest correlation with urinalysis and continued attention on follow-up CT. 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 Acute bilateral pulmonary embolism, with CT evidence of right heart strain (RV/LV Ratio = 0.98) consistent with at least submassive (intermediate risk) PE. The presence of right heart strain has been associated with an increased risk of morbidity and mortality. Please activate Code PE by paging [phone]. Critical Value/emergent results were called by telephone at the time of interpretation on 09/29/2017 at 9:54 am to Dr. Lew Dawes , who verbally acknowledged these results. Electronically Signed   By: Earle Gell M.D.   On: 09/29/2017 09:58    EKG: Sinus rhythm Atrial premature complex RBBB and LAFB  Assessment/Plan Principal Problem:   Pulmonary embolism (HCC) Active Problems:   B12 deficiency   Essential hypertension   BPH (benign prostatic hyperplasia)   COPD GOLD I    Abdominal pain   Liver metastasis (HCC)   Pancreatic mass   #1. Pulmonary embolism. CT reveals Acute bilateral pulmonary embolism, with CT evidence of right heart strain (RV/LV Ratio = 0.98) consistent with at least submassive (intermediate risk) PE. The presence of right heart strain has been associated with an increased risk of morbidity and mortality. Patient without tachypnea no hypoxia -admit to step down for first 24 hours -Heparin drip initiated in the emergency department -continue heparin drip -We'll obtain a 2-D echo -Monitor oxygen saturation level -Provide oxygen supplementation  as indicated -Home medications include Plavix and aspirin -Will need long-term anticoagulation  #2. Liver metastases/pancreatic mass. Patient in the process of being worked up for "spots on liver". CT noted above reveals liver metastases and pancreatic mass. Alkaline phosphatase somewhat elevated total bilirubin within the limits of normal -Outpatient follow-up -Will ultimately need oncology consult  #3. Hypertension. Fair control in the emergency department. Home medications include atenolol, losartan -Continue  home meds -Monitor  #4. COPD. Not on home oxygen. Not hypoxic. CT of the chest with no suspicious pulmonary nodules or masses identified no evidence of pulmonary infiltrate or pleural effusion. Stable at baseline  #5. BPH. Stable at baseline -Continue home meds  #6.history of stroke. Home medications include Plavix statin aspirin -Holding Plavix and aspirin -Continue statin   DVT prophylaxis: heparin gtt  Code Status: full  Family Communication: none present  Disposition Plan: home  Consults called: none  Admission status: inpatient    Radene Gunning MD [facility]  If 7PM-7AM, please contact night-coverage [email or web address] Password [facility]  09/29/2017, 4:03 PM

## 2017-09-29 NOTE — ED Provider Notes (Signed)
[facility] EMERGENCY DEPARTMENT Provider Note   CSN: [id number] Arrival date & time: 09/29/17  1140     History   Chief Complaint Chief Complaint  Patient presents with  . PE-Confirmed    HPI ANAKIN VARKEY is a 84 y.o. male.  HPI   84 year old male presenting with shortness of breath and outpatient CT.  CT found pulmonary embolism.  Patient reports 1 month of shortness of breath.  He reports his has been worked up for some liver spots.  Otherwise has been healthy.  Is on Plavix, baby aspirin.  Patient at paper outpatient provider ordered CAT scan which showed submassive pulmonary embolism sent here to emergency department.  Patient mild tachypnea on arrival stable vital signs  Past Medical History:  Diagnosis Date  . B12 deficiency   . BPH (benign prostatic hypertrophy)   . GERD (gastroesophageal reflux disease)   . Hyperlipidemia   . Hypertension   . Hypogonadism male   . Skin cancer     Patient Active Problem List   Diagnosis Date Noted  . Pulmonary embolism ([place]) 09/29/2017  . Liver metastasis ([place]) 09/29/2017  . Pancreatic mass 09/29/2017  . Abdominal pain 09/12/2017  . Right foot pain 09/01/2017  . Epistaxis 06/27/2017  . Palpitations 06/27/2017  . Right hand pain 06/03/2017  . Dupuytren's contracture of right hand 06/03/2017  . Acute pain of left shoulder 06/03/2017  . Elevated glucose 12/24/2016  . Fatigue 12/24/2016  . Stroke (cerebrum) ([place]) 06/30/2016  . Acute ischemic stroke ([place]) 06/29/2016  . Solitary pulmonary nodule 05/26/2016  . Cigar smoker motivated to quit 04/03/2016  . Hematuria 04/01/2016  . Nontraumatic rupture of bicep tendon 12/16/2015  . Arthropathy of shoulder region 11/28/2015  . COPD GOLD I  07/30/2015  . CAP (community acquired pneumonia) 06/23/2015  . Bronchial spasm 06/23/2015  . Rupture of quadriceps muscle 04/15/2015  . Quadriceps tendon rupture 04/04/2015  . Vitamin D deficiency 01/18/2015  .  Diarrhea 10/22/2013  . Low back pain 11/27/2011  . Well adult exam 01/10/2011  . TMJ tenderness 09/12/2010  . B12 deficiency 11/24/2009  . Hypogonadism male 11/20/2009  . PERIPHERAL NEUROPATHY 11/20/2009  . HYPERLIPIDEMIA 12/26/2006  . Essential hypertension 12/26/2006  . BPH (benign prostatic hyperplasia) 12/26/2006    Past Surgical History:  Procedure Laterality Date  . APPENDECTOMY    . EYE SURGERY     glaucoma procedure -both eyes  . skin cancer excision     scalp  . TONSILLECTOMY          Home Medications    Prior to Admission medications   Medication Sig Start Date End Date Taking? Authorizing Provider  atenolol (TENORMIN) 50 MG tablet Take 1 tablet (50 mg total) by mouth 2 (two) times daily. 05/26/16  Yes Plotnikov, Evie Lacks, MD  clopidogrel (PLAVIX) 75 MG tablet TAKE 1 TABLET BY MOUTH DAILY 08/16/17  Yes Plotnikov, Evie Lacks, MD  Cyanocobalamin (VITAMIN B-12) 1000 MCG SUBL Place 1 tablet (1,000 mcg total) under the tongue daily. 11/28/15  Yes Plotnikov, Evie Lacks, MD  doxazosin (CARDURA) 1 MG tablet Take 1 tablet (1 mg total) by mouth at bedtime. 09/12/17  Yes Plotnikov, Evie Lacks, MD  escitalopram (LEXAPRO) 20 MG tablet Take 1 tablet (20 mg total) by mouth daily. 06/27/17  Yes Plotnikov, Evie Lacks, MD  finasteride (PROSCAR) 5 MG tablet Take 1 tablet (5 mg total) by mouth daily. 08/12/17 08/12/18 Yes Plotnikov, Evie Lacks, MD  losartan (COZAAR) 100 MG tablet Take 1  tablet (100 mg total) by mouth daily. 05/26/16  Yes Plotnikov, Evie Lacks, MD  pantoprazole (PROTONIX) 40 MG tablet Take 1 tablet (40 mg total) by mouth daily. 07/30/16  Yes Plotnikov, Evie Lacks, MD  pravastatin (PRAVACHOL) 40 MG tablet Take 1 tablet (40 mg total) by mouth daily. 09/21/16  Yes Plotnikov, Evie Lacks, MD  tamsulosin (FLOMAX) 0.4 MG CAPS capsule Take 1 capsule (0.4 mg total) by mouth daily. 06/27/17  Yes Plotnikov, Evie Lacks, MD  traMADol (ULTRAM) 50 MG tablet TAKE 1/2-1 TABLET BY MOUTH EVERY 12 HOURS AS  NEEDED FOR SEVERE PAIN 06/02/17  Yes Rosemarie Ax, MD  triamcinolone ointment (KENALOG) 0.1 % Apply 1 application topically 2 (two) times daily. 12/24/16  Yes Plotnikov, Evie Lacks, MD    Family History Family History  Problem Relation Age of Onset  . Heart disease Father   . Heart disease Brother        CAD/MI  . Cancer Neg Hx        no colon cancer, no prostate cancer  . Colon cancer Neg Hx   . Esophageal cancer Neg Hx   . Rectal cancer Neg Hx   . Stomach cancer Neg Hx     Social History Social History   Tobacco Use  . Smoking status: Current Every Day Smoker    Packs/day: 1.00    Years: 25.00    Pack years: 25.00    Types: Cigars  . Smokeless tobacco: Never Used  Substance Use Topics  . Alcohol use: Yes    Alcohol/week: 9.0 oz    Types: 15 Standard drinks or equivalent per week  . Drug use: No     Allergies   Cardura [doxazosin mesylate]   Review of Systems Review of Systems  Constitutional: Positive for fatigue. Negative for activity change and fever.  Respiratory: Positive for cough and shortness of breath.   Cardiovascular: Negative for chest pain and palpitations.  Gastrointestinal: Negative for abdominal pain.  All other systems reviewed and are negative.    Physical Exam Updated Vital Signs BP 130/78   Pulse 85   Temp 98.7 F (37.1 C) (Oral)   Resp (!) 21   Ht 5\' 6"  (1.676 m)   Wt 77.1 kg (170 lb)   SpO2 98%   BMI 27.44 kg/m   Physical Exam  Constitutional: He is oriented to person, place, and time. He appears well-nourished.  HENT:  Head: Normocephalic.  Eyes: Conjunctivae are normal. Right eye exhibits no discharge. Left eye exhibits no discharge.  Cardiovascular: Normal rate and regular rhythm.  Pulmonary/Chest: Effort normal and breath sounds normal. No stridor. No respiratory distress.  Abdominal: Soft. He exhibits no distension. There is no tenderness.  Neurological: He is oriented to person, place, and time.  Skin: Skin is  warm and dry. He is not diaphoretic.  Psychiatric: He has a normal mood and affect. His behavior is normal.     ED Treatments / Results  Labs (all labs ordered are listed, but only abnormal results are displayed) Labs Reviewed  CBC - Abnormal; Notable for the following components:      Result Value   WBC 12.3 (*)    All other components within normal limits  COMPREHENSIVE METABOLIC PANEL - Abnormal; Notable for the following components:   Sodium 134 (*)    Chloride 99 (*)    CO2 21 (*)    Glucose, Bld 215 (*)    Albumin 3.4 (*)    AST 47 (*)  Alkaline Phosphatase 331 (*)    GFR calc non Af Amer 55 (*)    All other components within normal limits  I-STAT TROPONIN, ED - Abnormal; Notable for the following components:   Troponin i, poc 0.10 (*)    All other components within normal limits  I-STAT CG4 LACTIC ACID, ED - Abnormal; Notable for the following components:   Lactic Acid, Venous 2.61 (*)    All other components within normal limits  BRAIN NATRIURETIC PEPTIDE  HEPARIN LEVEL (UNFRACTIONATED)  I-STAT TROPONIN, ED    EKG EKG Interpretation  Date/Time:  Thursday September 29 2017 13:26:29 EDT Ventricular Rate:  79 PR Interval:    QRS Duration: 127 QT Interval:  409 QTC Calculation: 469 R Axis:   -47 Text Interpretation:  Sinus rhythm Atrial premature complex RBBB and LAFB RBBB that is similar to prior  Confirmed by Thomasene Lot, Saman Giddens [phone]) on 09/29/2017 4:10:48 PM   Radiology Ct Abdomen Pelvis W Wo Contrast  Result Date: 09/29/2017 CLINICAL DATA:  Right upper quadrant abdominal pain, weight loss, and anorexia. Hepatic masses on recent ultrasound. EXAM: CT CHEST WITH CONTRAST CT ABDOMEN AND PELVIS WITH AND WITHOUT CONTRAST TECHNIQUE: Multidetector CT imaging of the chest was performed during intravenous contrast administration. Multidetector CT imaging of the abdomen and pelvis was performed following the standard protocol before and during bolus administration of  intravenous contrast. CONTRAST:  160mL ISOVUE-300 IOPAMIDOL (ISOVUE-300) INJECTION 61% COMPARISON:  Chest CT on 10/04/2016 and AP CT on 04/05/2016 FINDINGS: CT CHEST FINDINGS Cardiovascular: Pulmonary embolism is seen in both the right and left pulmonary arteries and old bilateral lobar branches. No saddle embolus seen. RV/LV ratio is 0.98, consistent with right heart strain. Thrombosis of the right subclavian vein is seen with adjacent soft tissue edema. Mediastinum/Lymph Nodes: No masses or pathologically enlarged lymph nodes identified. Lungs/Pleura: 7 mm pulmonary nodule in the posterior right lower lobe and 5 mm pulmonary nodule in the superior left lower lobe are stable since previous studies, consistent with benign etiology. No suspicious pulmonary nodules or masses identified. No evidence of pulmonary infiltrate or pleural effusion. Musculoskeletal:  No suspicious bone lesions identified. CT ABDOMEN AND PELVIS FINDINGS Hepatobiliary: Numerous rim enhancing hypovascular masses are seen throughout the right and left hepatic lobes which are new since previous study, consistent with diffuse liver metastases. Largest mass in the medial segment left lobe measures 7.1 x 5.5 cm on image 19/4. Gallbladder is unremarkable. No evidence of biliary ductal dilatation. Pancreas: A new hypovascular mass is seen in the pancreatic tail which measures 5.4 x 2.5 cm on image 38/4, consistent with pancreatic adenocarcinoma. Spleen:  Within normal limits in size and appearance. Adrenals/Urinary tract: Normal appearance adrenal glands and right kidney. Small cyst again seen in upper pole of left kidney. A new 2 cm peripheral low-attenuation lesion is seen in the lower pole the left kidney which has ill-defined margins. This could be due to pyelonephritis, infarct, or neoplasm. No evidence of hydronephrosis. Mild diffuse bladder wall thickening is seen, likely due to chronic bladder outlet obstruction. Stomach/Bowel: Small hiatal  hernia noted. No evidence of obstruction, inflammatory process, or abnormal fluid collections. Diverticulosis is seen, without evidence of diverticulitis. Vascular/Lymphatic: No pathologically enlarged lymph nodes identified. No abdominal aortic aneurysm. Aortic atherosclerosis. Reproductive: Mildly enlarged prostate gland with indentation of bladder base. Other:  None. Musculoskeletal:  No suspicious bone lesions identified. IMPRESSION: 5.4 cm mass in pancreatic tail, consistent with pancreatic adenocarcinoma. Diffuse liver metastases. No evidence of metastatic disease within chest or pelvis.  New 2 cm ill-defined low-attenuation lesion in lower pole of left kidney, which is indeterminate. Differential diagnosis includes pyelonephritis, infarct, and neoplasm. Suggest correlation with urinalysis and continued attention on follow-up CT. 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 Acute bilateral pulmonary embolism, with CT evidence of right heart strain (RV/LV Ratio = 0.98) consistent with at least submassive (intermediate risk) PE. The presence of right heart strain has been associated with an increased risk of morbidity and mortality. Please activate Code PE by paging [phone]. Critical Value/emergent results were called by telephone at the time of interpretation on 09/29/2017 at 9:54 am to Dr. Lew Dawes , who verbally acknowledged these results. Electronically Signed   By: Earle Gell M.D.   On: 09/29/2017 09:58   Ct Chest W Contrast  Result Date: 09/29/2017 CLINICAL DATA:  Right upper quadrant abdominal pain, weight loss, and anorexia. Hepatic masses on recent ultrasound. EXAM: CT CHEST WITH CONTRAST CT ABDOMEN AND PELVIS WITH AND WITHOUT CONTRAST TECHNIQUE: Multidetector CT imaging of the chest was performed during intravenous contrast administration. Multidetector CT imaging of the abdomen and pelvis was performed following the standard protocol before and during bolus administration of  intravenous contrast. CONTRAST:  128mL ISOVUE-300 IOPAMIDOL (ISOVUE-300) INJECTION 61% COMPARISON:  Chest CT on 10/04/2016 and AP CT on 04/05/2016 FINDINGS: CT CHEST FINDINGS Cardiovascular: Pulmonary embolism is seen in both the right and left pulmonary arteries and old bilateral lobar branches. No saddle embolus seen. RV/LV ratio is 0.98, consistent with right heart strain. Thrombosis of the right subclavian vein is seen with adjacent soft tissue edema. Mediastinum/Lymph Nodes: No masses or pathologically enlarged lymph nodes identified. Lungs/Pleura: 7 mm pulmonary nodule in the posterior right lower lobe and 5 mm pulmonary nodule in the superior left lower lobe are stable since previous studies, consistent with benign etiology. No suspicious pulmonary nodules or masses identified. No evidence of pulmonary infiltrate or pleural effusion. Musculoskeletal:  No suspicious bone lesions identified. CT ABDOMEN AND PELVIS FINDINGS Hepatobiliary: Numerous rim enhancing hypovascular masses are seen throughout the right and left hepatic lobes which are new since previous study, consistent with diffuse liver metastases. Largest mass in the medial segment left lobe measures 7.1 x 5.5 cm on image 19/4. Gallbladder is unremarkable. No evidence of biliary ductal dilatation. Pancreas: A new hypovascular mass is seen in the pancreatic tail which measures 5.4 x 2.5 cm on image 38/4, consistent with pancreatic adenocarcinoma. Spleen:  Within normal limits in size and appearance. Adrenals/Urinary tract: Normal appearance adrenal glands and right kidney. Small cyst again seen in upper pole of left kidney. A new 2 cm peripheral low-attenuation lesion is seen in the lower pole the left kidney which has ill-defined margins. This could be due to pyelonephritis, infarct, or neoplasm. No evidence of hydronephrosis. Mild diffuse bladder wall thickening is seen, likely due to chronic bladder outlet obstruction. Stomach/Bowel: Small hiatal  hernia noted. No evidence of obstruction, inflammatory process, or abnormal fluid collections. Diverticulosis is seen, without evidence of diverticulitis. Vascular/Lymphatic: No pathologically enlarged lymph nodes identified. No abdominal aortic aneurysm. Aortic atherosclerosis. Reproductive: Mildly enlarged prostate gland with indentation of bladder base. Other:  None. Musculoskeletal:  No suspicious bone lesions identified. IMPRESSION: 5.4 cm mass in pancreatic tail, consistent with pancreatic adenocarcinoma. Diffuse liver metastases. No evidence of metastatic disease within chest or pelvis. New 2 cm ill-defined low-attenuation lesion in lower pole of left kidney, which is indeterminate. Differential diagnosis includes pyelonephritis, infarct, and neoplasm. Suggest correlation with urinalysis and continued attention on follow-up CT. Deep  vein thrombosis of right subclavian vein. Acute bilateral pulmonary embolism, with CT evidence of right heart strain (RV/LV Ratio = 0.98) consistent with at least submassive (intermediate risk) PE. The presence of right heart strain has been associated with an increased risk of morbidity and mortality. Please activate Code PE by paging [phone]. Critical Value/emergent results were called by telephone at the time of interpretation on 09/29/2017 at 9:54 am to Dr. Lew Dawes , who verbally acknowledged these results. Electronically Signed   By: Earle Gell M.D.   On: 09/29/2017 09:58    Procedures Procedures (including critical care time)  CRITICAL CARE Performed by: Gardiner Sleeper Total critical care time: 60 minutes Critical care time was exclusive of separately billable procedures and treating other patients. Critical care was necessary to treat or prevent imminent or life-threatening deterioration. Critical care was time spent personally by me on the following activities: development of treatment plan with patient and/or surrogate as well as nursing,  discussions with consultants, evaluation of patient's response to treatment, examination of patient, obtaining history from patient or surrogate, ordering and performing treatments and interventions, ordering and review of laboratory studies, ordering and review of radiographic studies, pulse oximetry and re-evaluation of patient's condition.   Medications Ordered in ED Medications  heparin ADULT infusion 100 units/mL (25000 units/26mL sodium chloride 0.45%) (1,350 Units/hr Intravenous Transfusing/Transfer 09/29/17 1536)  atenolol (TENORMIN) tablet 50 mg (has no administration in time range)  Vitamin B-12 SUBL 1,000 mcg (has no administration in time range)  escitalopram (LEXAPRO) tablet 20 mg (has no administration in time range)  finasteride (PROSCAR) tablet 5 mg (has no administration in time range)  losartan (COZAAR) tablet 100 mg (has no administration in time range)  pantoprazole (PROTONIX) EC tablet 40 mg (has no administration in time range)  pravastatin (PRAVACHOL) tablet 40 mg (has no administration in time range)  tamsulosin (FLOMAX) capsule 0.4 mg (has no administration in time range)  0.9 %  sodium chloride infusion ( Intravenous Transfusing/Transfer 09/29/17 1536)  acetaminophen (TYLENOL) tablet 650 mg (has no administration in time range)    Or  acetaminophen (TYLENOL) suppository 650 mg (has no administration in time range)  ondansetron (ZOFRAN) tablet 4 mg (has no administration in time range)    Or  ondansetron (ZOFRAN) injection 4 mg (has no administration in time range)  heparin bolus via infusion 5,400 Units (5,400 Units Intravenous Bolus from Bag 09/29/17 1239)     Initial Impression / Assessment and Plan / ED Course  I have reviewed the triage vital signs and the nursing notes.  Pertinent labs & imaging results that were available during my care of the patient were reviewed by me and considered in my medical decision making (see chart for details).     84 year old  male presenting with shortness of breath and outpatient CT.  CT found pulmonary embolism.  Patient reports 1 month of shortness of breath.  He reports his has been worked up for some liver spots.  Otherwise has been healthy.  Is on Plavix, baby aspirin.  Patient at outpatient provider ordered CAT scan which showed submassive pulmonary embolism sent here to emergency department.  Patient mild tachypnea on arrival stable vital signs. Heparin ordered.  PE called submassive.  Patient here mild tachypnea, small troponin leak.  Lactic mildly elevated.  EKG shows right bundle branch block. No hypoxia  CT directed to "initiate code PE.".  Will discuss with intensivist.  Patient likely safe for stepdown.  Discussed with intensivist. Safe for stepdown.  Patietn admitted to stepdown.  Had  long conversation with patient about mass seen on the pancreas.  Discussed likely etiologies.  Will defer to primary team for further work-up.   Final Clinical Impressions(s) / ED Diagnoses   Final diagnoses:  None    ED Discharge Orders    None       Macarthur Critchley, MD 09/29/17 1612

## 2017-09-29 NOTE — Progress Notes (Signed)
ANTICOAGULATION CONSULT NOTE  Pharmacy Consult for heparin Indication: pulmonary embolus  Heparin Dosing Weight: 77.1 kg  Labs: Recent Labs    09/26/17 1350  CREATININE 1.34    Assessment: 42 yom with new submassive PE w/ RHS asked to come to the ED. Pharmacy consulted to dose heparin. CBC pending. No bleed documented. Not on anticoagulation PTA.  Goal of Therapy:  Heparin level 0.3-0.7 units/ml Monitor platelets by anticoagulation protocol: Yes   Plan:  Heparin 5400 unit bolus Start heparin at 1350 units/h 6h heparin level Daily heparin level/CBC Monitor s/sx bleeding  Elicia Lamp, PharmD, BCPS Clinical Pharmacist 09/29/2017 12:16 PM

## 2017-09-29 NOTE — Progress Notes (Signed)
ANTICOAGULATION CONSULT NOTE - Follow Up Consult  Pharmacy Consult for Heparin Indication: DVT/PE  Allergies  Allergen Reactions  . Cardura [Doxazosin Mesylate] Other (See Comments)    dizziness    Patient Measurements: Height: 5\' 6"  (167.6 cm) Weight: 160 lb 4.4 oz (72.7 kg) IBW/kg (Calculated) : 63.8 Heparin Dosing Weight: 72.7 kg  Vital Signs: Temp: 98.2 F (36.8 C) (04/25 1925) Temp Source: Oral (04/25 1925) BP: 173/92 (04/25 1925) Pulse Rate: 82 (04/25 1925)  Labs: Recent Labs    09/29/17 1207 09/29/17 1858  HGB 13.1  --   HCT 40.5  --   PLT 164  --   HEPARINUNFRC  --  0.36  CREATININE 1.19  --     Estimated Creatinine Clearance: 43.9 mL/min (by C-G formula based on SCr of 1.19 mg/dL).  Assessment: CC/HPI: Presents from PCP with abnormal CT.after 1 months of SOB, weight loss, and fatigue. 5+ cm pancreatic tail adenocarcinoma; R subclavian vein DVT; diffuse hepatic metastases; and at least submassive PE.  Anticoag: PE likely from underlying malignancy. CBC WNL. Initial HL 0.36 in goal.   Goal of Therapy:  Heparin level 0.3-0.7 units/ml Monitor platelets by anticoagulation protocol: Yes   Plan:  Continue IV heparin at 1350 units/hr Heparin level and CBC in AM.   Tiyonna Sardinha S. Alford Highland, PharmD, BCPS Clinical Staff Pharmacist Pager [phone]  Eilene Ghazi Stillinger 09/29/2017,8:20 PM

## 2017-09-29 NOTE — ED Notes (Signed)
Coming from PCP-states he has B/L PEs-history of stroke-patient is currently on plavix

## 2017-09-29 NOTE — ED Notes (Signed)
Attempted report 

## 2017-09-29 NOTE — ED Notes (Signed)
MD at bedside. 

## 2017-09-29 NOTE — ED Triage Notes (Signed)
Pt sent here by Dr. Alain Marion, pt at [facility] yesterday, submassive PE with heart strain noted. Told to come to ED. Pt c/o diffuse abd pain.  Pt speaking in sentences, no distress noted.

## 2017-09-30 ENCOUNTER — Encounter (HOSPITAL_COMMUNITY): Payer: Self-pay | Admitting: Hematology & Oncology

## 2017-09-30 ENCOUNTER — Inpatient Hospital Stay (HOSPITAL_COMMUNITY): Payer: Medicare Other

## 2017-09-30 ENCOUNTER — Telehealth: Payer: Self-pay | Admitting: Internal Medicine

## 2017-09-30 DIAGNOSIS — Z66 Do not resuscitate: Secondary | ICD-10-CM

## 2017-09-30 DIAGNOSIS — C787 Secondary malignant neoplasm of liver and intrahepatic bile duct: Secondary | ICD-10-CM

## 2017-09-30 DIAGNOSIS — I10 Essential (primary) hypertension: Secondary | ICD-10-CM

## 2017-09-30 DIAGNOSIS — I340 Nonrheumatic mitral (valve) insufficiency: Secondary | ICD-10-CM

## 2017-09-30 DIAGNOSIS — C259 Malignant neoplasm of pancreas, unspecified: Secondary | ICD-10-CM

## 2017-09-30 DIAGNOSIS — I351 Nonrheumatic aortic (valve) insufficiency: Secondary | ICD-10-CM

## 2017-09-30 DIAGNOSIS — F17290 Nicotine dependence, other tobacco product, uncomplicated: Secondary | ICD-10-CM

## 2017-09-30 DIAGNOSIS — I2699 Other pulmonary embolism without acute cor pulmonale: Principal | ICD-10-CM

## 2017-09-30 DIAGNOSIS — K869 Disease of pancreas, unspecified: Secondary | ICD-10-CM

## 2017-09-30 HISTORY — DX: Malignant neoplasm of pancreas, unspecified: C25.9

## 2017-09-30 LAB — COMPREHENSIVE METABOLIC PANEL
ALK PHOS: 311 U/L — AB (ref 38–126)
ALT: 33 U/L (ref 17–63)
AST: 42 U/L — ABNORMAL HIGH (ref 15–41)
Albumin: 3.2 g/dL — ABNORMAL LOW (ref 3.5–5.0)
Anion gap: 10 (ref 5–15)
BILIRUBIN TOTAL: 1 mg/dL (ref 0.3–1.2)
BUN: 13 mg/dL (ref 6–20)
CALCIUM: 8.8 mg/dL — AB (ref 8.9–10.3)
CO2: 24 mmol/L (ref 22–32)
CREATININE: 1.06 mg/dL (ref 0.61–1.24)
Chloride: 101 mmol/L (ref 101–111)
GFR calc Af Amer: >60 mL/min (ref >60)
Glucose, Bld: 131 mg/dL — ABNORMAL HIGH (ref 65–99)
Potassium: 4 mmol/L (ref 3.5–5.1)
Sodium: 135 mmol/L (ref 135–145)
TOTAL PROTEIN: 6 g/dL — AB (ref 6.5–8.1)

## 2017-09-30 LAB — CBC
HEMATOCRIT: 37.7 % — AB (ref 39.0–52.0)
Hemoglobin: 11.9 g/dL — ABNORMAL LOW (ref 13.0–17.0)
MCH: 26.6 pg (ref 26.0–34.0)
MCHC: 31.6 g/dL (ref 30.0–36.0)
MCV: 84.2 fL (ref 78.0–100.0)
Platelets: 166 10*3/uL (ref 150–400)
RBC: 4.48 MIL/uL (ref 4.22–5.81)
RDW: 14.4 % (ref 11.5–15.5)
WBC: 13.1 10*3/uL — AB (ref 4.0–10.5)

## 2017-09-30 LAB — ECHOCARDIOGRAM COMPLETE
HEIGHTINCHES: 66 in
WEIGHTICAEL: 2564.39 [oz_av]

## 2017-09-30 LAB — HEPARIN LEVEL (UNFRACTIONATED): HEPARIN UNFRACTIONATED: 0.3 [IU]/mL (ref 0.30–0.70)

## 2017-09-30 MED ORDER — BOOST PLUS PO LIQD
237.0000 mL | Freq: Three times a day (TID) | ORAL | Status: DC
  Administered 2017-09-30: 237 mL via ORAL
  Administered 2017-10-01 – 2017-10-02 (×2): via ORAL
  Administered 2017-10-03 – 2017-10-05 (×3): 237 mL via ORAL
  Filled 2017-09-30 (×19): qty 237

## 2017-09-30 NOTE — Progress Notes (Signed)
Initial Nutrition Assessment  DOCUMENTATION CODES:   Not applicable  INTERVENTION:    Boost Plus po BID, each supplement provides 360 kcal and 14 grams of protein  NUTRITION DIAGNOSIS:   Increased nutrient needs related to catabolic illness as evidenced by estimated needs  GOAL:   Patient will meet greater than or equal to 90% of their needs  MONITOR:   PO intake, Supplement acceptance, Labs, Skin, I & O's, Weight trends  REASON FOR ASSESSMENT:   Malnutrition Screening Tool  ASSESSMENT:   84 y.o. male with a Past Medical History of HTN; HLD; CVA; and BPH who presents after having been sent by his PCP based on an abnormal CT.  He has had 1 month of SOB, unexplained weight loss, and fatigue. Imaging showed 5+ cm pancreatic tail adenocarcinoma.  Several family members present upon RD visit.  Lunch meal untouched on tray table (tuna salad plate). Pt states his appetite is good. Likes Boost Plus supplements.  PO intake 90-100% per flowsheet records. Also reveals he's lost 14 lbs in approximately 3 weeks. Labs and medications reviewed.  NUTRITION - FOCUSED PHYSICAL EXAM:  Deferred at this time  Diet Order:  Diet regular Room service appropriate? Yes; Fluid consistency: Thin  EDUCATION NEEDS:   Not appropriate for education at this time  Skin:  Skin Assessment: Reviewed RN Assessment  Last BM:  4/24  Height:   Ht Readings from Last 1 Encounters:  09/29/17 5\' 6"  (1.676 m)   Weight:   Wt Readings from Last 1 Encounters:  09/29/17 160 lb 4.4 oz (72.7 kg)   BMI:  Body mass index is 25.87 kg/m.  Estimated Nutritional Needs:   Kcal:  1800-2000  Protein:  90-105 gm  Fluid:  1.8-2.0 L  Arthur Holms, RD, LDN Pager #: [phone] After-Hours Pager #: [phone]

## 2017-09-30 NOTE — Telephone Encounter (Unsigned)
Copied from [place] [phone] [phone]. Topic: Quick Communication - See Telephone Encounter >> Sep 30, 2017 12:41 PM Neva Seat wrote: Pt's wife is asking if Dr. Alain Marion could go by the [facility] to see pt if he is still in there.   She's asking if he could do this after his appts today.

## 2017-09-30 NOTE — Consult Note (Signed)
Referral MD  Reason for Referral: Multiple liver masses, pancreatic  Mass; pulmonary emboli   Chief Complaint  Patient presents with  . PE-Confirmed  : I have lost weight  and I have liver tumors.  HPI: [name]  is a very nice 84 year old white male.  He is originally from [place].  He worked for [employer] for over 30 years.  He was transferred down to [employer].  He was in transferred up to [employer] in [place].  Once he retired, he moved back to [employer].  He is incredibly healthy.  He is been doing quite well.  He is been very active until recently.  He began to lose some weight.  He began to have some shortness of breath.  He was having some right upper quadrant abdominal pain.  He subsequently had a going to the hospital.  He had a CT scan done of his chest/abdomen/pelvis.  This year that he had bilateral pulmonary emboli.  More importantly, was that he had multiple liver masses and a mass in the tail of the pancreas.  His labs looked okay.  He was a little bit anemic.  Liver function tests were slightly elevated.  He is on heparin drip.  He has not noted any leg swelling.  He smokes cigars.  He does have social alcohol use.  There is no history of cancer in his family.  He has had no history of diabetes.  He has no rashes.  He has had no change in bowel or bladder habits.  He has had no headache.  Overall, status performance status is ECOG 1.     Past Medical History:  Diagnosis Date  . B12 deficiency   . BPH (benign prostatic hypertrophy)   . GERD (gastroesophageal reflux disease)   . Hyperlipidemia   . Hypertension   . Hypogonadism male   . Skin cancer   :  Past Surgical History:  Procedure Laterality Date  . APPENDECTOMY    . EYE SURGERY     glaucoma procedure -both eyes  . skin cancer excision     scalp  . TONSILLECTOMY    :   Current Facility-Administered Medications:  .  0.9 %  sodium chloride infusion, ,  Intravenous, Continuous, Black, Karen M, NP, Last Rate: 50 mL/hr at 09/30/17 1105 .  acetaminophen (TYLENOL) tablet 650 mg, 650 mg, Oral, Q6H PRN **OR** acetaminophen (TYLENOL) suppository 650 mg, 650 mg, Rectal, Q6H PRN, Black, Karen M, NP .  atenolol (TENORMIN) tablet 50 mg, 50 mg, Oral, BID, Black, Karen M, NP, 50 mg at 09/30/17 0920 .  escitalopram (LEXAPRO) tablet 20 mg, 20 mg, Oral, Daily, Black, Karen M, NP, 20 mg at 09/30/17 0920 .  finasteride (PROSCAR) tablet 5 mg, 5 mg, Oral, Daily, Black, Karen M, NP, 5 mg at 09/30/17 0920 .  heparin ADULT infusion 100 units/mL (25000 units/24mL sodium chloride 0.45%), 1,450 Units/hr, Intravenous, Continuous, Rolla Flatten, [facility], Last Rate: 14.5 mL/hr at 09/30/17 0921, 1,450 Units/hr at 09/30/17 0921 .  lactose free nutrition (BOOST PLUS) liquid 237 mL, 237 mL, Oral, TID WC, Cherene Altes, MD .  losartan (COZAAR) tablet 100 mg, 100 mg, Oral, Daily, Black, Karen M, NP, 100 mg at 09/30/17 0920 .  ondansetron (ZOFRAN) tablet 4 mg, 4 mg, Oral, Q6H PRN **OR** ondansetron (ZOFRAN) injection 4 mg, 4 mg, Intravenous, Q6H PRN, Black, Karen M, NP .  pantoprazole (PROTONIX) EC tablet 40 mg, 40 mg, Oral, Daily, Black, Karen M,  NP, 40 mg at 09/30/17 0921 .  pravastatin (PRAVACHOL) tablet 40 mg, 40 mg, Oral, Daily, Black, Karen M, NP, 40 mg at 09/30/17 0921 .  tamsulosin (FLOMAX) capsule 0.4 mg, 0.4 mg, Oral, Daily, Black, Karen M, NP, 0.4 mg at 09/30/17 0921 .  vitamin B-12 (CYANOCOBALAMIN) tablet 1,000 mcg, 1,000 mcg, Oral, Daily, Black, Karen M, NP, 1,000 mcg at 09/30/17 4431:  . atenolol  50 mg Oral BID  . escitalopram  20 mg Oral Daily  . finasteride  5 mg Oral Daily  . lactose free nutrition  237 mL Oral TID WC  . losartan  100 mg Oral Daily  . pantoprazole  40 mg Oral Daily  . pravastatin  40 mg Oral Daily  . tamsulosin  0.4 mg Oral Daily  . vitamin B-12  1,000 mcg Oral Daily  :  Allergies  Allergen Reactions  . Cardura [Doxazosin Mesylate]  Other (See Comments)    dizziness  :  Family History  Problem Relation Age of Onset  . Heart disease Father   . Heart disease Brother        CAD/MI  . Cancer Neg Hx        no colon cancer, no prostate cancer  . Colon cancer Neg Hx   . Esophageal cancer Neg Hx   . Rectal cancer Neg Hx   . Stomach cancer Neg Hx   :  Social History   Socioeconomic History  . Marital status: Married    Spouse name: Not on file  . Number of children: 3  . Years of education: 41  . Highest education level: Not on file  Occupational History    Employer: RETIRED  Social Needs  . Financial resource strain: Not on file  . Food insecurity:    Worry: Not on file    Inability: Not on file  . Transportation needs:    Medical: Not on file    Non-medical: Not on file  Tobacco Use  . Smoking status: Current Every Day Smoker    Packs/day: 1.00    Years: 25.00    Pack years: 25.00    Types: Cigars  . Smokeless tobacco: Never Used  Substance and Sexual Activity  . Alcohol use: Yes    Alcohol/week: 9.0 oz    Types: 15 Standard drinks or equivalent per week  . Drug use: No  . Sexual activity: Yes    Partners: Female  Lifestyle  . Physical activity:    Days per week: Not on file    Minutes per session: Not on file  . Stress: Not on file  Relationships  . Social connections:    Talks on phone: Not on file    Gets together: Not on file    Attends religious service: Not on file    Active member of club or organization: Not on file    Attends meetings of clubs or organizations: Not on file    Relationship status: Not on file  . Intimate partner violence:    Fear of current or ex partner: Not on file    Emotionally abused: Not on file    Physically abused: Not on file    Forced sexual activity: Not on file  Other Topics Concern  . Not on file  Social History Narrative   HSG, [employer] [facility] [place]. Married '61 2 dtrs - '68, 69; 2 sons - '72, '78. 4 grandchildren.   Work - Magazine features editor- Data processing manager). Marriage is  stable. He is retired. ACP - No CPR, No mechanical ventilation for more than short-term. No futile or heroic measures.   :  Review of Systems  Constitutional: Positive for malaise/fatigue and weight loss.  HENT: Negative.   Eyes: Negative.   Respiratory: Positive for shortness of breath.   Cardiovascular: Positive for chest pain and orthopnea.  Gastrointestinal: Positive for abdominal pain.  Genitourinary: Negative.   Musculoskeletal: Positive for joint pain.  Skin: Negative.   Neurological: Negative.   Endo/Heme/Allergies: Negative.   Psychiatric/Behavioral: Negative.      Exam:  See above Patient Vitals for the past 24 hrs:  BP Temp Temp src Pulse Resp SpO2  09/30/17 1306 (!) 182/73 97.6 F (36.4 C) Oral (!) 52 (!) 23 98 %  09/30/17 0747 (!) 168/79 97.9 F (36.6 C) Oral (!) 57 20 98 %  09/30/17 0334 (!) 146/72 98 F (36.7 C) Oral 65 20 94 %  09/29/17 2342 (!) 173/87 (!) 97.5 F (36.4 C) Oral 61 15 96 %  09/29/17 2127 (!) 177/93 - - 76 - -  09/29/17 1925 (!) 173/92 98.2 F (36.8 C) Oral 82 (!) 27 95 %     Recent Labs    09/29/17 1207 09/30/17 0225  WBC 12.3* 13.1*  HGB 13.1 11.9*  HCT 40.5 37.7*  PLT 164 166   Recent Labs    09/29/17 1207 09/30/17 0225  NA 134* 135  K 4.0 4.0  CL 99* 101  CO2 21* 24  GLUCOSE 215* 131*  BUN 16 13  CREATININE 1.19 1.06  CALCIUM 9.0 8.8*    Blood smear review: None  Pathology: Pending    Assessment and Plan: Mr. Ballin is an 84 year old white male.  He has in all likelihood, metastatic pancreatic cancer.  There was nothing is very consistent with metastatic  pancreatic cancer.  He is clearly hypercoagulable.  He is on heparin right now.  I would keep him on heparin until after invasive procedures are done.  He will need a liver biopsy.  This way, we can get enough material so that we can send off studies for genetic analysis.  I talked to him about his situation.  I told him  that I thought that he had stage IV pancreatic cancer.  I told him that this was a disease that we can treat but it clearly is not curable.  He has a decent performance status so he would favor treatment.  I told him that treatment would be chemotherapy unless we found that he had a specific genetic mutation with his malignancy such that we could use an oral drug.  He will need to have a Port-A-Cath placed.  I explained to him what a Port-A-Cath was.  He understands this.  He agrees.  I told him that our goal of care would be to help improve his quality of life.  May be, we could improve his quality of life.  I said that with out therapy or treatment did not work, he probably would have no more than 4 months.  I told him that weight loss would be a very important marker for [place] to look for to tell how he is doing.  I see that he is already a DNR.  I certainly cannot argue with this.  I would like to get a Doppler of his legs to see if he has any thromboembolic disease in his legs.  I probably would favor putting him on Xarelto or Eliquis.  I  think this would be reasonable for anticoagulation.  I spent about an hour with him.  Over half the time was spent face-to-face with him talking to him about his CT scan results, his lab work, and all recommendations.  He is very nice.  I really had a good time with him.  It was nice talking to you about [place].  We will follow along.  I would keep him in the hospital on heparin until after he has his procedures done.  Lattie Haw, MD  1 Thessalonians 5:16-18

## 2017-09-30 NOTE — Progress Notes (Signed)
Pt O2 sats 78-85% on RA. Pt placed on 2LNC, O2 sats increased to 93%. Will continue to monitor.  Clint Bolder, RN 09/30/17 11:50 PM

## 2017-09-30 NOTE — Progress Notes (Signed)
ANTICOAGULATION CONSULT NOTE - Follow Up Consult  Pharmacy Consult for Heparin Indication: DVT/PE  Allergies  Allergen Reactions  . Cardura [Doxazosin Mesylate] Other (See Comments)    dizziness    Patient Measurements: Height: 5\' 6"  (167.6 cm) Weight: 160 lb 4.4 oz (72.7 kg) IBW/kg (Calculated) : 63.8 Heparin Dosing Weight: 72.7 kg  Vital Signs: Temp: 98 F (36.7 C) (04/26 0334) Temp Source: Oral (04/26 0334) BP: 146/72 (04/26 0334) Pulse Rate: 65 (04/26 0334)  Labs: Recent Labs    09/29/17 1207 09/29/17 1858 09/30/17 0225  HGB 13.1  --  11.9*  HCT 40.5  --  37.7*  PLT 164  --  166  HEPARINUNFRC  --  0.36 0.30  CREATININE 1.19  --  1.06    Estimated Creatinine Clearance: 49.3 mL/min (by C-G formula based on SCr of 1.06 mg/dL).  Assessment: 83 yom with new submassive PE w/ RHS asked to come to the ED. Pharmacy consulted to dose heparin.   Heparin level this morning remains therapeutic but on the lower end of the range and trended down (HL 0.3 << 0.36, goal of 0.3-0.7). Hgb/Hct slight drop, plts wnl. No bleeding noted. Will increase the drip rate slightly to keep within range.   Goal of Therapy:  Heparin level 0.3-0.7 units/ml Monitor platelets by anticoagulation protocol: Yes   Plan:  - Increase Heparin drip slightly to 1450 units/hr (14.5 ml/hr) - Will continue to monitor for any signs/symptoms of bleeding and will follow up with heparin level in the a.m.   Thank you for allowing pharmacy to be a part of this patient's care.  Alycia Rossetti, PharmD, BCPS Clinical Pharmacist Pager: [phone] 09/30/2017 7:48 AM

## 2017-09-30 NOTE — Progress Notes (Addendum)
[facility] TEAM 1 - Stepdown/ICU TEAM  Austin Torres  CZY:[id number] DOB: April 21, 1934 DOA: 09/29/2017 PCP: Cassandria Anger, MD    Brief Narrative:  84 y.o. male w/ a hx of HTN; HLD; CVA; and BPH who experienced 1 month of SOB, unexplained weight loss, and fatigue.  His PCP ordered CT imaging of the chest and abdom (to evaluate liver masses noted on abdom US 4/16 obtained to eval diffuse abdom pain) which revealed a 5+ cm pancreatic tail adenocarcinoma; R subclavian vein DVT; diffuse hepatic metastases; and at least submassive PE.  Significant Events: 4/25 admit   Subjective: Patient is sitting up in a bedside chair.  He reports no current chest pain.  He does report some dyspnea with exertion but feels that this is already improved already.  He has many questions concerning his pancreatic mass and further work-up from the standpoint.  He denies hematemesis hematuria hematochezia GI upset nausea vomiting or diarrhea.  Assessment & Plan:  PE - R subclavian DVT Continue IV heparin for now until Oncology has commented on timing of CA workup/possible need for bx - appears hemodynamically stable at this time   Newly diagnosed probable metastatic Pancreatic Adenocarcinoma  Suspect will need transcutaneous biospy of either liver lesion or pancreatic lesion - this will be difficult to coordinate as an outpt - will go ahead and ask Oncology to see while inpt to assist in formulating strategy for workup  COPD Well compensated at this time  HTN Follow trend w/o change in tx for now  HLD Cont home medical tx   Hx of CVA (due to SVD) Jan 2018 On chronic Plavix and ASA - will plan to stop when decision made on outpt anticoag regimen   BPH  DVT prophylaxis: IV heparin  Code Status: DNR - NO CODE Family Communication: spoke w/ wife at bedside at length  Disposition Plan: SDU  Consultants:  none  Antimicrobials:  none   Objective: Blood pressure (!) 168/79, pulse (!) 57,  temperature 97.9 F (36.6 C), temperature source Oral, resp. rate 20, height 5\' 6"  (1.676 m), weight 72.7 kg (160 lb 4.4 oz), SpO2 98 %.  Intake/Output Summary (Last 24 hours) at 09/30/2017 1217 Last data filed at 09/30/2017 1105 Gross per 24 hour  Intake 1792.09 ml  Output 450 ml  Net 1342.09 ml   Filed Weights   09/29/17 1150 09/29/17 1600  Weight: 77.1 kg (170 lb) 72.7 kg (160 lb 4.4 oz)    Examination: General: No acute respiratory distress Lungs: Clear to auscultation bilaterally without wheezes or crackles Cardiovascular: Regular rate and rhythm without murmur gallop or rub normal S1 and S2 Abdomen: Nontender, nondistended, soft, bowel sounds positive, no rebound, no ascites, no appreciable mass Extremities: No significant cyanosis, clubbing, or edema bilateral lower extremities  CBC: Recent Labs  Lab 09/29/17 1207 09/30/17 0225  WBC 12.3* 13.1*  HGB 13.1 11.9*  HCT 40.5 37.7*  MCV 84.4 84.2  PLT 164 093   Basic Metabolic Panel: Recent Labs  Lab 09/26/17 1350 09/29/17 1207 09/30/17 0225  NA 135 134* 135  K 4.1 4.0 4.0  CL 98 99* 101  CO2 23 21* 24  GLUCOSE 219* 215* 131*  BUN 21 16 13   CREATININE 1.34 1.19 1.06  CALCIUM 9.2 9.0 8.8*   GFR: Estimated Creatinine Clearance: 49.3 mL/min (by C-G formula based on SCr of 1.06 mg/dL).  Liver Function Tests: Recent Labs  Lab 09/29/17 1207 09/30/17 0225  AST 47* 42*  ALT 34 33  ALKPHOS 331* 311*  BILITOT 1.2 1.0  PROT 6.6 6.0*  ALBUMIN 3.4* 3.2*    HbA1C: Hgb A1c MFr Bld  Date/Time Value Ref Range Status  12/24/2016 04:19 PM 6.4 4.6 - 6.5 % Final    Comment:    Glycemic Control Guidelines for People with Diabetes:Non Diabetic:  <6%Goal of Therapy: <7%Additional Action Suggested:  >8%   06/30/2016 03:21 AM 6.2 (H) 4.8 - 5.6 % Final    Comment:    (NOTE)         Pre-diabetes: 5.7 - 6.4         Diabetes: >6.4         Glycemic control for adults with diabetes: <7.0      Recent Results (from the  past 240 hour(s))  MRSA PCR Screening     Status: None   Collection Time: 09/29/17  4:33 PM  Result Value Ref Range Status   MRSA by PCR NEGATIVE NEGATIVE Final    Comment:        The GeneXpert MRSA Assay (FDA approved for NASAL specimens only), is one component of a comprehensive MRSA colonization surveillance program. It is not intended to diagnose MRSA infection nor to guide or monitor treatment for MRSA infections. Performed at [facility], [place] [address], [place], [place] [address]      Scheduled Meds: . atenolol  50 mg Oral BID  . escitalopram  20 mg Oral Daily  . finasteride  5 mg Oral Daily  . losartan  100 mg Oral Daily  . pantoprazole  40 mg Oral Daily  . pravastatin  40 mg Oral Daily  . tamsulosin  0.4 mg Oral Daily  . vitamin B-12  1,000 mcg Oral Daily   Continuous Infusions: . sodium chloride 50 mL/hr at 09/30/17 1105  . heparin 1,450 Units/hr (09/30/17 0921)     LOS: 1 day   Cherene Altes, MD [facility] Office  [phone] Pager - Text Page per Shea Evans as per below:  On-Call/Text Page:      [email or web address]      password [email or web address]  If 7PM-7AM, please contact night-coverage [email or web address] Password [email or web address] 09/30/2017, 12:17 PM

## 2017-09-30 NOTE — Progress Notes (Signed)
Patient has been independently to and from bathroom without difficulty all day. Patient had unwitnessed fall from bathroom to bed. Nurse tech assisted patient back to bed. Assessed patient and patient was A+O X3/4. Hand grips present, foot dorsiflexion/plantar flexion present.VS stable. Found one scab on patient's right knee. MD notified.

## 2017-09-30 NOTE — Progress Notes (Signed)
Nurse tech reported that another staff RN took patient to bathroom and instructed him to pull the bathroom cord for help. Nurse tech found patient in bed.

## 2017-09-30 NOTE — Telephone Encounter (Signed)
Noted, we unfortunately do not do hospital rounds

## 2017-09-30 NOTE — Progress Notes (Signed)
#   2.  S/W [facility] @ [facility] # [phone]   1. XARELTO 15 MG BID  COVER- YES  CO-PAY- $ 45.00  TIER- 3 DRUG  PRIOR APPROVAL- NO    2. XARELTO 20 MG DAILY  COVER- YES  CO-PAY- $ 45.00  TIER- 3 DRUG  PRIOR APPROVAL- NO    3. ELIQUIS  2.5 MG BID  CAVER- YES  CO-PAY- $ 45.00  TIER- 3 DRUG  PRIOR APPROVAL- NO    4. ELIQUIS 5 MG BID  COVER- YES  CO-PAY- $ 45.00  TIER- 3 DRUG  PRIOR APPROVAL- NO   NO DEDUCTIBLE   PREFERRED PHARMACY : YES - [facility] AND [facility]

## 2017-10-01 ENCOUNTER — Inpatient Hospital Stay (HOSPITAL_COMMUNITY): Payer: Medicare Other

## 2017-10-01 DIAGNOSIS — M7989 Other specified soft tissue disorders: Secondary | ICD-10-CM

## 2017-10-01 DIAGNOSIS — R0602 Shortness of breath: Secondary | ICD-10-CM

## 2017-10-01 LAB — CBC
HEMATOCRIT: 36.8 % — AB (ref 39.0–52.0)
HEMOGLOBIN: 11.7 g/dL — AB (ref 13.0–17.0)
MCH: 26.7 pg (ref 26.0–34.0)
MCHC: 31.8 g/dL (ref 30.0–36.0)
MCV: 83.8 fL (ref 78.0–100.0)
Platelets: 175 10*3/uL (ref 150–400)
RBC: 4.39 MIL/uL (ref 4.22–5.81)
RDW: 14.3 % (ref 11.5–15.5)
WBC: 13 10*3/uL — ABNORMAL HIGH (ref 4.0–10.5)

## 2017-10-01 LAB — COMPREHENSIVE METABOLIC PANEL
ALK PHOS: 292 U/L — AB (ref 38–126)
ALT: 31 U/L (ref 17–63)
AST: 38 U/L (ref 15–41)
Albumin: 3 g/dL — ABNORMAL LOW (ref 3.5–5.0)
Anion gap: 12 (ref 5–15)
BILIRUBIN TOTAL: 0.9 mg/dL (ref 0.3–1.2)
BUN: 15 mg/dL (ref 6–20)
CALCIUM: 8.7 mg/dL — AB (ref 8.9–10.3)
CO2: 23 mmol/L (ref 22–32)
CREATININE: 1.15 mg/dL (ref 0.61–1.24)
Chloride: 101 mmol/L (ref 101–111)
GFR calc non Af Amer: 58 mL/min — ABNORMAL LOW (ref >60)
GLUCOSE: 120 mg/dL — AB (ref 65–99)
Potassium: 3.8 mmol/L (ref 3.5–5.1)
Sodium: 136 mmol/L (ref 135–145)
TOTAL PROTEIN: 6.1 g/dL — AB (ref 6.5–8.1)

## 2017-10-01 LAB — IRON AND TIBC
IRON: 34 ug/dL — AB (ref 45–182)
Saturation Ratios: 15 % — ABNORMAL LOW (ref 17.9–39.5)
TIBC: 234 ug/dL — AB (ref 250–450)
UIBC: 200 ug/dL

## 2017-10-01 LAB — FERRITIN: Ferritin: 239 ng/mL (ref 24–336)

## 2017-10-01 LAB — HEPARIN LEVEL (UNFRACTIONATED): Heparin Unfractionated: 0.31 IU/mL (ref 0.30–0.70)

## 2017-10-01 MED ORDER — BENZONATATE 100 MG PO CAPS
200.0000 mg | ORAL_CAPSULE | Freq: Three times a day (TID) | ORAL | Status: DC | PRN
  Administered 2017-10-01 – 2017-10-05 (×4): 200 mg via ORAL
  Filled 2017-10-01 (×5): qty 2

## 2017-10-01 NOTE — Plan of Care (Signed)
  Problem: Safety: Goal: Ability to remain free from injury will improve Outcome: Progressing  Pt had an unwitnessed fall in his room earlier today walking back from the bathroom to his bed. Pt has been placed on a low bed with floor mats and the bed alarm is on. RN applied condom cath to assist pt with voiding. Pt was reminded to use his call bell for any assistance he may need.

## 2017-10-01 NOTE — H&P (Signed)
Chief Complaint: Metastatic cancer  Referring Physician(s): Ennever  Supervising Physician: Marybelle Killings  Patient Status: [facility] - In-pt  History of Present Illness: Austin Torres is a 84 y.o. male with a past medical history of hypertension, hyperlipidemia, CVA, and BPH who presented to the hospital on 09/29/2017 after having been sent by his PCP based on an abnormal CT.    He has had 1 month of shortness of breath, unexplained weight loss, and fatigue.    CT scan shows a 5+ cm pancreatic tail adenocarcinoma, a right subclavian vein DVT, diffuse hepatic metastases, and at least submassive PE.  We are asked to perform a liver biopsy and place a Port A Cath.  He takes Plavix for his CVA and his most recent dose was this morning.  Per our IR anticoagulation guidelines, Plavix must be held x 5 days prior to any invasive procedure.  He is also currently on heparin drip for his PE.  Past Medical History:  Diagnosis Date  . B12 deficiency   . BPH (benign prostatic hypertrophy)   . GERD (gastroesophageal reflux disease)   . Hyperlipidemia   . Hypertension   . Hypogonadism male   . Pancreatic cancer metastasized to liver ([place]) 09/30/2017  . Skin cancer     Past Surgical History:  Procedure Laterality Date  . APPENDECTOMY    . EYE SURGERY     glaucoma procedure -both eyes  . skin cancer excision     scalp  . TONSILLECTOMY      Allergies: Cardura [doxazosin mesylate]  Medications: Prior to Admission medications   Medication Sig Start Date End Date Taking? Authorizing Provider  atenolol (TENORMIN) 50 MG tablet Take 1 tablet (50 mg total) by mouth 2 (two) times daily. 05/26/16  Yes Plotnikov, Evie Lacks, MD  clopidogrel (PLAVIX) 75 MG tablet TAKE 1 TABLET BY MOUTH DAILY 08/16/17  Yes Plotnikov, Evie Lacks, MD  Cyanocobalamin (VITAMIN B-12) 1000 MCG SUBL Place 1 tablet (1,000 mcg total) under the tongue daily. 11/28/15  Yes Plotnikov, Evie Lacks, MD  doxazosin (CARDURA) 1  MG tablet Take 1 tablet (1 mg total) by mouth at bedtime. 09/12/17  Yes Plotnikov, Evie Lacks, MD  escitalopram (LEXAPRO) 20 MG tablet Take 1 tablet (20 mg total) by mouth daily. 06/27/17  Yes Plotnikov, Evie Lacks, MD  finasteride (PROSCAR) 5 MG tablet Take 1 tablet (5 mg total) by mouth daily. 08/12/17 08/12/18 Yes Plotnikov, Evie Lacks, MD  losartan (COZAAR) 100 MG tablet Take 1 tablet (100 mg total) by mouth daily. 05/26/16  Yes Plotnikov, Evie Lacks, MD  pantoprazole (PROTONIX) 40 MG tablet Take 1 tablet (40 mg total) by mouth daily. 07/30/16  Yes Plotnikov, Evie Lacks, MD  pravastatin (PRAVACHOL) 40 MG tablet Take 1 tablet (40 mg total) by mouth daily. 09/21/16  Yes Plotnikov, Evie Lacks, MD  tamsulosin (FLOMAX) 0.4 MG CAPS capsule Take 1 capsule (0.4 mg total) by mouth daily. 06/27/17  Yes Plotnikov, Evie Lacks, MD  traMADol (ULTRAM) 50 MG tablet TAKE 1/2-1 TABLET BY MOUTH EVERY 12 HOURS AS NEEDED FOR SEVERE PAIN 06/02/17  Yes Rosemarie Ax, MD  triamcinolone ointment (KENALOG) 0.1 % Apply 1 application topically 2 (two) times daily. 12/24/16  Yes Plotnikov, Evie Lacks, MD     Family History  Problem Relation Age of Onset  . Heart disease Father   . Heart disease Brother        CAD/MI  . Cancer Neg Hx  no colon cancer, no prostate cancer  . Colon cancer Neg Hx   . Esophageal cancer Neg Hx   . Rectal cancer Neg Hx   . Stomach cancer Neg Hx     Social History   Socioeconomic History  . Marital status: Married    Spouse name: Not on file  . Number of children: 3  . Years of education: 6  . Highest education level: Not on file  Occupational History    Employer: RETIRED  Social Needs  . Financial resource strain: Not on file  . Food insecurity:    Worry: Not on file    Inability: Not on file  . Transportation needs:    Medical: Not on file    Non-medical: Not on file  Tobacco Use  . Smoking status: Current Every Day Smoker    Packs/day: 1.00    Years: 25.00    Pack years:  25.00    Types: Cigars  . Smokeless tobacco: Never Used  Substance and Sexual Activity  . Alcohol use: Yes    Alcohol/week: 9.0 oz    Types: 15 Standard drinks or equivalent per week  . Drug use: No  . Sexual activity: Yes    Partners: Female  Lifestyle  . Physical activity:    Days per week: Not on file    Minutes per session: Not on file  . Stress: Not on file  Relationships  . Social connections:    Talks on phone: Not on file    Gets together: Not on file    Attends religious service: Not on file    Active member of club or organization: Not on file    Attends meetings of clubs or organizations: Not on file    Relationship status: Not on file  Other Topics Concern  . Not on file  Social History Narrative   HSG, [employer] [facility] [place]. Married '61 2 dtrs - '68, 69; 2 sons - '72, '78. 4 grandchildren.   Work - Magazine features editor- Engineer, maintenance). Marriage is stable. He is retired. ACP - No CPR, No mechanical ventilation for more than short-term. No futile or heroic measures.     Review of Systems: A 12 point ROS discussed and pertinent positives are indicated in the HPI above.  All other systems are negative. Review of Systems  Vital Signs: BP (!) 119/59   Pulse 60   Temp 98.4 F (36.9 C) (Oral)   Resp (!) 22   Ht 5\' 6"  (1.676 m)   Wt 160 lb 4.4 oz (72.7 kg)   SpO2 97%   BMI 25.87 kg/m   Physical Exam  Constitutional: He is oriented to person, place, and time. He appears well-developed.  HENT:  Head: Normocephalic and atraumatic.  Eyes: EOM are normal.  Neck: Normal range of motion.  Cardiovascular: Normal rate and regular rhythm.  Pulmonary/Chest: Effort normal. No respiratory distress.  Abdominal: Soft.  Musculoskeletal: Normal range of motion.  Neurological: He is alert and oriented to person, place, and time.  Skin: Skin is warm and dry.  Psychiatric: He has a normal mood and affect. His behavior is normal. Judgment and thought content normal.    Vitals reviewed.   Imaging: Ct Abdomen Pelvis W Wo Contrast  Result Date: 09/29/2017 CLINICAL DATA:  Right upper quadrant abdominal pain, weight loss, and anorexia. Hepatic masses on recent ultrasound. EXAM: CT CHEST WITH CONTRAST CT ABDOMEN AND PELVIS WITH AND WITHOUT CONTRAST TECHNIQUE: Multidetector CT imaging of the  chest was performed during intravenous contrast administration. Multidetector CT imaging of the abdomen and pelvis was performed following the standard protocol before and during bolus administration of intravenous contrast. CONTRAST:  154mL ISOVUE-300 IOPAMIDOL (ISOVUE-300) INJECTION 61% COMPARISON:  Chest CT on 10/04/2016 and AP CT on 04/05/2016 FINDINGS: CT CHEST FINDINGS Cardiovascular: Pulmonary embolism is seen in both the right and left pulmonary arteries and old bilateral lobar branches. No saddle embolus seen. RV/LV ratio is 0.98, consistent with right heart strain. Thrombosis of the right subclavian vein is seen with adjacent soft tissue edema. Mediastinum/Lymph Nodes: No masses or pathologically enlarged lymph nodes identified. Lungs/Pleura: 7 mm pulmonary nodule in the posterior right lower lobe and 5 mm pulmonary nodule in the superior left lower lobe are stable since previous studies, consistent with benign etiology. No suspicious pulmonary nodules or masses identified. No evidence of pulmonary infiltrate or pleural effusion. Musculoskeletal:  No suspicious bone lesions identified. CT ABDOMEN AND PELVIS FINDINGS Hepatobiliary: Numerous rim enhancing hypovascular masses are seen throughout the right and left hepatic lobes which are new since previous study, consistent with diffuse liver metastases. Largest mass in the medial segment left lobe measures 7.1 x 5.5 cm on image 19/4. Gallbladder is unremarkable. No evidence of biliary ductal dilatation. Pancreas: A new hypovascular mass is seen in the pancreatic tail which measures 5.4 x 2.5 cm on image 38/4, consistent with  pancreatic adenocarcinoma. Spleen:  Within normal limits in size and appearance. Adrenals/Urinary tract: Normal appearance adrenal glands and right kidney. Small cyst again seen in upper pole of left kidney. A new 2 cm peripheral low-attenuation lesion is seen in the lower pole the left kidney which has ill-defined margins. This could be due to pyelonephritis, infarct, or neoplasm. No evidence of hydronephrosis. Mild diffuse bladder wall thickening is seen, likely due to chronic bladder outlet obstruction. Stomach/Bowel: Small hiatal hernia noted. No evidence of obstruction, inflammatory process, or abnormal fluid collections. Diverticulosis is seen, without evidence of diverticulitis. Vascular/Lymphatic: No pathologically enlarged lymph nodes identified. No abdominal aortic aneurysm. Aortic atherosclerosis. Reproductive: Mildly enlarged prostate gland with indentation of bladder base. Other:  None. Musculoskeletal:  No suspicious bone lesions identified. IMPRESSION: 5.4 cm mass in pancreatic tail, consistent with pancreatic adenocarcinoma. Diffuse liver metastases. No evidence of metastatic disease within chest or pelvis. New 2 cm ill-defined low-attenuation lesion in lower pole of left kidney, which is indeterminate. Differential diagnosis includes pyelonephritis, infarct, and neoplasm. Suggest correlation with urinalysis and continued attention on follow-up CT. 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 Acute bilateral pulmonary embolism, with CT evidence of right heart strain (RV/LV Ratio = 0.98) consistent with at least submassive (intermediate risk) PE. The presence of right heart strain has been associated with an increased risk of morbidity and mortality. Please activate Code PE by paging [phone]. Critical Value/emergent results were called by telephone at the time of interpretation on 09/29/2017 at 9:54 am to Dr. Lew Dawes , who verbally acknowledged these results. Electronically Signed    By: Earle Gell M.D.   On: 09/29/2017 09:58   Ct Chest W Contrast  Result Date: 09/29/2017 CLINICAL DATA:  Right upper quadrant abdominal pain, weight loss, and anorexia. Hepatic masses on recent ultrasound. EXAM: CT CHEST WITH CONTRAST CT ABDOMEN AND PELVIS WITH AND WITHOUT CONTRAST TECHNIQUE: Multidetector CT imaging of the chest was performed during intravenous contrast administration. Multidetector CT imaging of the abdomen and pelvis was performed following the standard protocol before and during bolus administration of intravenous contrast. CONTRAST:  158mL ISOVUE-300  IOPAMIDOL (ISOVUE-300) INJECTION 61% COMPARISON:  Chest CT on 10/04/2016 and AP CT on 04/05/2016 FINDINGS: CT CHEST FINDINGS Cardiovascular: Pulmonary embolism is seen in both the right and left pulmonary arteries and old bilateral lobar branches. No saddle embolus seen. RV/LV ratio is 0.98, consistent with right heart strain. Thrombosis of the right subclavian vein is seen with adjacent soft tissue edema. Mediastinum/Lymph Nodes: No masses or pathologically enlarged lymph nodes identified. Lungs/Pleura: 7 mm pulmonary nodule in the posterior right lower lobe and 5 mm pulmonary nodule in the superior left lower lobe are stable since previous studies, consistent with benign etiology. No suspicious pulmonary nodules or masses identified. No evidence of pulmonary infiltrate or pleural effusion. Musculoskeletal:  No suspicious bone lesions identified. CT ABDOMEN AND PELVIS FINDINGS Hepatobiliary: Numerous rim enhancing hypovascular masses are seen throughout the right and left hepatic lobes which are new since previous study, consistent with diffuse liver metastases. Largest mass in the medial segment left lobe measures 7.1 x 5.5 cm on image 19/4. Gallbladder is unremarkable. No evidence of biliary ductal dilatation. Pancreas: A new hypovascular mass is seen in the pancreatic tail which measures 5.4 x 2.5 cm on image 38/4, consistent with  pancreatic adenocarcinoma. Spleen:  Within normal limits in size and appearance. Adrenals/Urinary tract: Normal appearance adrenal glands and right kidney. Small cyst again seen in upper pole of left kidney. A new 2 cm peripheral low-attenuation lesion is seen in the lower pole the left kidney which has ill-defined margins. This could be due to pyelonephritis, infarct, or neoplasm. No evidence of hydronephrosis. Mild diffuse bladder wall thickening is seen, likely due to chronic bladder outlet obstruction. Stomach/Bowel: Small hiatal hernia noted. No evidence of obstruction, inflammatory process, or abnormal fluid collections. Diverticulosis is seen, without evidence of diverticulitis. Vascular/Lymphatic: No pathologically enlarged lymph nodes identified. No abdominal aortic aneurysm. Aortic atherosclerosis. Reproductive: Mildly enlarged prostate gland with indentation of bladder base. Other:  None. Musculoskeletal:  No suspicious bone lesions identified. IMPRESSION: 5.4 cm mass in pancreatic tail, consistent with pancreatic adenocarcinoma. Diffuse liver metastases. No evidence of metastatic disease within chest or pelvis. New 2 cm ill-defined low-attenuation lesion in lower pole of left kidney, which is indeterminate. Differential diagnosis includes pyelonephritis, infarct, and neoplasm. Suggest correlation with urinalysis and continued attention on follow-up CT. 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 Acute bilateral pulmonary embolism, with CT evidence of right heart strain (RV/LV Ratio = 0.98) consistent with at least submassive (intermediate risk) PE. The presence of right heart strain has been associated with an increased risk of morbidity and mortality. Please activate Code PE by paging [phone]. Critical Value/emergent results were called by telephone at the time of interpretation on 09/29/2017 at 9:54 am to Dr. Lew Dawes , who verbally acknowledged these results. Electronically Signed    By: Earle Gell M.D.   On: 09/29/2017 09:58   US Abdomen Complete  Result Date: 09/20/2017 CLINICAL DATA:  Generalized abdominal pain EXAM: ABDOMEN ULTRASOUND COMPLETE COMPARISON:  04/05/2016 CT of the abdomen, 10/04/2016 CT of the chest FINDINGS: Gallbladder: Gallbladder is well distended with gallbladder sludge. No definitive stones are seen. No wall thickening is noted. Common bile duct: Diameter: 8 mm although within normal limits for the patient's given age. Liver: Multiple hypoechoic masses are noted throughout the liver. The largest of these measures 9 cm in greatest dimension. These are consistent with metastatic disease. Portal vein is patent on color Doppler imaging with normal direction of blood flow towards the liver. IVC: No abnormality visualized. Pancreas:  Visualized portion unremarkable. Spleen: Size and appearance within normal limits. Right Kidney: Length: 9.8 cm. Echogenicity within normal limits. No mass or hydronephrosis visualized. Left Kidney: Length: 11 cm. Echogenicity within normal limits. No mass or hydronephrosis visualized. Abdominal aorta: No aneurysm visualized. Other findings: None. IMPRESSION: Changes consistent with diffuse hepatic metastatic disease. Further workup by means of CT of the abdomen and pelvis with contrast is recommended. Chest CT may be helpful as well for further evaluation. Tissue sampling is also indicated. These results will be called to the ordering clinician or representative by the Radiologist Assistant, and communication documented in the PACS or zVision Dashboard. Electronically Signed   By: Inez Catalina M.D.   On: 09/20/2017 09:25    Labs:  CBC: Recent Labs    08/09/17 1542 09/29/17 1207 09/30/17 0225 10/01/17 0301  WBC 10.4 12.3* 13.1* 13.0*  HGB 13.5 13.1 11.9* 11.7*  HCT 40.9 40.5 37.7* 36.8*  PLT 190.0 164 166 175    COAGS: Recent Labs    06/22/17 0750  INR 1.14    BMP: Recent Labs    09/26/17 1350 09/29/17 1207  09/30/17 0225 10/01/17 0301  NA 135 134* 135 136  K 4.1 4.0 4.0 3.8  CL 98 99* 101 101  CO2 23 21* 24 23  GLUCOSE 219* 215* 131* 120*  BUN 21 16 13 15   CALCIUM 9.2 9.0 8.8* 8.7*  CREATININE 1.34 1.19 1.06 1.15  GFRNONAA  --  55* >60 58*  GFRAA  --  >60 >60 >60    LIVER FUNCTION TESTS: Recent Labs    09/29/17 1207 09/30/17 0225 10/01/17 0301  BILITOT 1.2 1.0 0.9  AST 47* 42* 38  ALT 34 33 31  ALKPHOS 331* 311* 292*  PROT 6.6 6.0* 6.1*  ALBUMIN 3.4* 3.2* 3.0*    TUMOR MARKERS: No results for input(s): AFPTM, CEA, CA199, CHROMGRNA in the last 8760 hours.  Assessment and Plan:  Multiple liver lesions likely metastatic pancreatic cancer.  Patient on Plavix, last dose this morning.  Risks and benefits of the liver biopsy were discussed at length with the patient and his family (about 6 family members at the bedside) including, but not limited to bleeding, infection, damage to adjacent structures or low yield requiring additional tests.  Risks and benefits of image guided port-a-catheter placement were also discussed with the patient and family including, but not limited to bleeding, infection, pneumothorax, or fibrin sheath development and need for additional procedures.  All questions were answered.  Patient is agreeable to proceed as an outpatient.  He does NOT want to stay in the hospital 5 days just to hold his Plavix.  PE = Currently on heparin drip. He would like to be discharged to home with either Lovenox or Xarelto.  He would need to hold Lovenox or Xarelto x 24 hours minimal prior to the procedure. Both can be re-started the DAY AFTER the procedure as long as there are no signs of bleeding.  Dr. Marin Olp will need to place an outpatient order and we will accommodate the request ASAP at either [facility] or [facility].  Thank you for this interesting consult.  I greatly enjoyed meeting YISHAI REHFELD and look forward to participating in their care.  A copy of this report  was sent to the requesting provider on this date.  Electronically Signed: Murrell Redden, PA-C   10/01/2017, 1:41 PM      I spent a total of 40 Minutes in face to face in clinical consultation,  greater than 50% of which was counseling/coordinating care for liver biopsy and Port A Cath.

## 2017-10-01 NOTE — Progress Notes (Addendum)
ANTICOAGULATION CONSULT NOTE - Follow Up Consult  Pharmacy Consult for Heparin Indication: DVT/PE  Allergies  Allergen Reactions  . Cardura [Doxazosin Mesylate] Other (See Comments)    dizziness   Patient Measurements: Height: 5\' 6"  (167.6 cm) Weight: 160 lb 4.4 oz (72.7 kg) IBW/kg (Calculated) : 63.8 Heparin Dosing Weight: 72.7 kg  Vital Signs: Temp: 98.4 F (36.9 C) (04/27 0418) Temp Source: Oral (04/27 0418) BP: 148/95 (04/27 0418) Pulse Rate: 79 (04/27 0418)  Labs: Recent Labs    09/29/17 1207 09/29/17 1858 09/30/17 0225 10/01/17 0301  HGB 13.1  --  11.9* 11.7*  HCT 40.5  --  37.7* 36.8*  PLT 164  --  166 175  HEPARINUNFRC  --  0.36 0.30 0.31  CREATININE 1.19  --  1.06 1.15   Estimated Creatinine Clearance: 45.5 mL/min (by C-G formula based on SCr of 1.15 mg/dL).  Assessment: 46 yoM with new submassive PE w/ RHS, continues on IV heparin. Heparin level this AM remains within goal but still very near the lower end at 0.31 despite slight rate increase yesterday. Hgb 11.7 stable, pltc WNL. No bleeding noted. Per oncology, would favor Xarelto or Eliquis for long term AC.  Goal of Therapy:  Heparin level 0.3-0.7 units/ml Monitor platelets by anticoagulation protocol: Yes   Plan:  Increase heparin gtt drip slightly to 1500 units/hr Daily heparin level and CBC Monitor for s/sx of bleeding F/u plans for transition to [facility]  Thank you for allowing pharmacy to be a part of this patient's care.  Mila Merry Gerarda Fraction, PharmD PGY1 Pharmacy Resident Pager: [phone] 10/01/2017 7:29 AM

## 2017-10-01 NOTE — Progress Notes (Signed)
Bilateral lower extremity venous duplex completed, Preliminary results - Right - Positive for a localized acute mobile DVT in the distal common femoral vein at the confluence of the femoral and profunda veins. Left - Positive for an acute DVT of one of the paired veins in the posterior tibial veins and the peroneal veins. There is no evidence of a superficial thrombosis of the lower extremity bilaterally. No evidence of a Baker's cyst bilaterally. [facility], [place] 10/01/2017 4:36pm

## 2017-10-01 NOTE — Progress Notes (Signed)
[place] TEAM 1 - Stepdown/ICU TEAM  Austin Torres  PFX:[id number] DOB: July 02, 1933 DOA: 09/29/2017 PCP: Cassandria Anger, MD    Brief Narrative:  84 y.o. male w/ a hx of HTN; HLD; CVA; and BPH who experienced 1 month of SOB, unexplained weight loss, and fatigue.  His PCP ordered CT imaging of the chest and abdom (to evaluate liver masses noted on abdom US 4/16 obtained to eval diffuse abdom pain) which revealed a 5+ cm pancreatic tail adenocarcinoma; R subclavian vein DVT; diffuse hepatic metastases; and at least submassive PE.  Significant Events: 4/25 admit   Subjective: No new complaints today.  Denies cp, sob, n/v, or abdom pain.  With his permission I spoke at length with his family at bedside concerning his new diagnosis of pancreatic CA, as well as his PE/DVT diagnosis, and our tx plan.  The pt was convinced he was still getting his Plavix during his hospital stay.  To my view the records suggest he has not been prescribed Plavix since the time of his admit.  I will ask the Pharmacy to confirm this, as the preferred route of progress would be to remain inpatient on IV heparin until pt is cleared to have a liver bx as well as a porta-cath insertion (Tues/Wed).      Assessment & Plan:  PE - R subclavian DVT Continue IV heparin - appears hemodynamically stable at this time - B LE venous duplex to evaluate clot burden   Newly diagnosed probable metastatic Pancreatic Adenocarcinoma  Awaiting timing/scheduling of liver bx and porta-cath placement, depending on timing of last dose of Plavix   COPD Well compensated at this time  HTN Reasonably controlled  HLD Cont home medical tx   Hx of CVA (due to SVD) Jan 2018 On chronic Plavix as outpt - it appears this was stopped at the time of his admission when heparin was begun   BPH  DVT prophylaxis: IV heparin  Code Status: DNR - NO CODE Family Communication: spoke w/ wife and children at bedside at length   Disposition Plan:  SDU  Consultants:  none  Antimicrobials:  none   Objective: Blood pressure (!) 149/76, pulse 69, temperature 99.4 F (37.4 C), temperature source Oral, resp. rate 17, height 5\' 6"  (1.676 m), weight 72.7 kg (160 lb 4.4 oz), SpO2 99 %.  Intake/Output Summary (Last 24 hours) at 10/01/2017 1548 Last data filed at 10/01/2017 0400 Gross per 24 hour  Intake 903 ml  Output -  Net 903 ml   Filed Weights   09/29/17 1150 09/29/17 1600  Weight: 77.1 kg (170 lb) 72.7 kg (160 lb 4.4 oz)    Examination: General: No acute respiratory distress Lungs: CTA B - no wheezing  Cardiovascular: RRR - no M or rub  Abdomen: NT/ND, soft, bs+, no mass  Extremities: No edema B LE   CBC: Recent Labs  Lab 09/29/17 1207 09/30/17 0225 10/01/17 0301  WBC 12.3* 13.1* 13.0*  HGB 13.1 11.9* 11.7*  HCT 40.5 37.7* 36.8*  MCV 84.4 84.2 83.8  PLT 164 166 329   Basic Metabolic Panel: Recent Labs  Lab 09/29/17 1207 09/30/17 0225 10/01/17 0301  NA 134* 135 136  K 4.0 4.0 3.8  CL 99* 101 101  CO2 21* 24 23  GLUCOSE 215* 131* 120*  BUN 16 13 15   CREATININE 1.19 1.06 1.15  CALCIUM 9.0 8.8* 8.7*   GFR: Estimated Creatinine Clearance: 45.5 mL/min (by C-G formula based on SCr of 1.15 mg/dL).  Liver Function Tests: Recent Labs  Lab 09/29/17 1207 09/30/17 0225 10/01/17 0301  AST 47* 42* 38  ALT 34 33 31  ALKPHOS 331* 311* 292*  BILITOT 1.2 1.0 0.9  PROT 6.6 6.0* 6.1*  ALBUMIN 3.4* 3.2* 3.0*    HbA1C: Hgb A1c MFr Bld  Date/Time Value Ref Range Status  12/24/2016 04:19 PM 6.4 4.6 - 6.5 % Final    Comment:    Glycemic Control Guidelines for People with Diabetes:Non Diabetic:  <6%Goal of Therapy: <7%Additional Action Suggested:  >8%   06/30/2016 03:21 AM 6.2 (H) 4.8 - 5.6 % Final    Comment:    (NOTE)         Pre-diabetes: 5.7 - 6.4         Diabetes: >6.4         Glycemic control for adults with diabetes: <7.0      Recent Results (from the past 240 hour(s))  MRSA PCR Screening      Status: None   Collection Time: 09/29/17  4:33 PM  Result Value Ref Range Status   MRSA by PCR NEGATIVE NEGATIVE Final    Comment:        The GeneXpert MRSA Assay (FDA approved for NASAL specimens only), is one component of a comprehensive MRSA colonization surveillance program. It is not intended to diagnose MRSA infection nor to guide or monitor treatment for MRSA infections. Performed at [facility], [place] [address], [place], [place] [address]      Scheduled Meds: . atenolol  50 mg Oral BID  . escitalopram  20 mg Oral Daily  . finasteride  5 mg Oral Daily  . lactose free nutrition  237 mL Oral TID WC  . losartan  100 mg Oral Daily  . pantoprazole  40 mg Oral Daily  . pravastatin  40 mg Oral Daily  . tamsulosin  0.4 mg Oral Daily  . vitamin B-12  1,000 mcg Oral Daily   Continuous Infusions: . sodium chloride 50 mL/hr at 09/30/17 1105  . heparin 1,500 Units/hr (10/01/17 2536)     LOS: 2 days   Cherene Altes, MD [facility] Office  [phone] Pager - Text Page per Shea Evans as per below:  On-Call/Text Page:      [email or web address]      password [email or web address]  If 7PM-7AM, please contact night-coverage [email or web address] Password [facility] 10/01/2017, 3:48 PM

## 2017-10-01 NOTE — Progress Notes (Signed)
Asked by Dr. Thereasa Solo to investigate if patient has gotten Plavix during admission as patient was confident it was given to him.  Plavix has not been ordered or administered during this admission.  Patient has received Protonix and pravastatin during the admission, so it is possible he confused the drug names.  The above was text-paged to Dr. Thereasa Solo.  Call with questions.  Chrystina Naff D. Turner Baillie, PharmD, BCPS Clinical Pharmacist  [phone] 10/01/2017 3:53 PM

## 2017-10-02 DIAGNOSIS — R160 Hepatomegaly, not elsewhere classified: Secondary | ICD-10-CM

## 2017-10-02 DIAGNOSIS — I2602 Saddle embolus of pulmonary artery with acute cor pulmonale: Secondary | ICD-10-CM

## 2017-10-02 DIAGNOSIS — C259 Malignant neoplasm of pancreas, unspecified: Secondary | ICD-10-CM

## 2017-10-02 LAB — CBC
HEMATOCRIT: 35.3 % — AB (ref 39.0–52.0)
HEMOGLOBIN: 11.3 g/dL — AB (ref 13.0–17.0)
MCH: 26.8 pg (ref 26.0–34.0)
MCHC: 32 g/dL (ref 30.0–36.0)
MCV: 83.6 fL (ref 78.0–100.0)
Platelets: 175 10*3/uL (ref 150–400)
RBC: 4.22 MIL/uL (ref 4.22–5.81)
RDW: 14.7 % (ref 11.5–15.5)
WBC: 13.5 10*3/uL — AB (ref 4.0–10.5)

## 2017-10-02 LAB — HEPARIN LEVEL (UNFRACTIONATED): HEPARIN UNFRACTIONATED: 0.5 [IU]/mL (ref 0.30–0.70)

## 2017-10-02 NOTE — Progress Notes (Signed)
  Chart reviewed.  It appears patient's last dose of Plavix was prior to admission, 09/28/17.  Will plan for Port A Cath and Liver Biopsy on 10/04/2017.  Will review with IR schedulers tomorrow about timing of procedure so Heparin drip can be stopped appropriately prior to.  Patient is aware and agreeable.  Mallampati score is a 2, ASA 3  Nami Strawder S Naiara Lombardozzi PA-C 10/02/2017 8:01 AM

## 2017-10-02 NOTE — Progress Notes (Signed)
[place] TEAM 1 - Stepdown/ICU TEAM  Austin Torres  ERX:[id number] DOB: 1934/02/29 DOA: 09/29/2017 PCP: Cassandria Anger, MD    Brief Narrative:  84 y.o. male w/ a hx of HTN; HLD; CVA; and BPH who experienced 1 month of SOB, unexplained weight loss, and fatigue.  His PCP ordered CT imaging of the chest and abdom (to evaluate liver masses noted on abdom US 4/16 obtained to eval diffuse abdom pain) which revealed a 5+ cm pancreatic tail adenocarcinoma; R subclavian vein DVT; diffuse hepatic metastases; and at least submassive PE.  Significant Events: 4/25 admit   Subjective: Resting comfortably watching hockey.  No complaints today.  In good spirits.  A delightful gentleman.    Assessment & Plan:  PE - R subclavian DVT - B LE DVT  Continue IV heparin - appears hemodynamically stable at this time - B LE venous duplex note B LE clots    Newly diagnosed probable metastatic Pancreatic Adenocarcinoma  Tentative plan is now for porta-cath and liver bx on 4/30 - IR following   COPD Well compensated at this time  HTN Reasonably controlled  HLD Cont home medical tx   Hx of CVA (due to SVD) Jan 2018 On chronic Plavix as outpt - it appears this was stopped at the time of his admission when heparin was begun - Pharmacy confirms he has not received Plavix while in the hospital   BPH  DVT prophylaxis: IV heparin  Code Status: DNR - NO CODE Family Communication: no family present at time of exam today  Disposition Plan: SDU  Consultants:  none  Antimicrobials:  none   Objective: Blood pressure 126/70, pulse 74, temperature 97.6 F (36.4 C), temperature source Oral, resp. rate (!) 21, height 5\' 6"  (1.676 m), weight 72.7 kg (160 lb 4.4 oz), SpO2 90 %.  Intake/Output Summary (Last 24 hours) at 10/02/2017 1610 Last data filed at 10/02/2017 0400 Gross per 24 hour  Intake 767.85 ml  Output -  Net 767.85 ml   Filed Weights   09/29/17 1150 09/29/17 1600  Weight: 77.1 kg (170  lb) 72.7 kg (160 lb 4.4 oz)    Examination: General: No acute respiratory distress Lungs: CTA B  Cardiovascular: RRR  Abdomen: NT/ND, soft, bs+, no mass  Extremities: No signif edema B LE   CBC: Recent Labs  Lab 09/30/17 0225 10/01/17 0301 10/02/17 0319  WBC 13.1* 13.0* 13.5*  HGB 11.9* 11.7* 11.3*  HCT 37.7* 36.8* 35.3*  MCV 84.2 83.8 83.6  PLT 166 175 950   Basic Metabolic Panel: Recent Labs  Lab 09/29/17 1207 09/30/17 0225 10/01/17 0301  NA 134* 135 136  K 4.0 4.0 3.8  CL 99* 101 101  CO2 21* 24 23  GLUCOSE 215* 131* 120*  BUN 16 13 15   CREATININE 1.19 1.06 1.15  CALCIUM 9.0 8.8* 8.7*   GFR: Estimated Creatinine Clearance: 45.5 mL/min (by C-G formula based on SCr of 1.15 mg/dL).  Liver Function Tests: Recent Labs  Lab 09/29/17 1207 09/30/17 0225 10/01/17 0301  AST 47* 42* 38  ALT 34 33 31  ALKPHOS 331* 311* 292*  BILITOT 1.2 1.0 0.9  PROT 6.6 6.0* 6.1*  ALBUMIN 3.4* 3.2* 3.0*    HbA1C: Hgb A1c MFr Bld  Date/Time Value Ref Range Status  12/24/2016 04:19 PM 6.4 4.6 - 6.5 % Final    Comment:    Glycemic Control Guidelines for People with Diabetes:Non Diabetic:  <6%Goal of Therapy: <7%Additional Action Suggested:  >8%  06/30/2016 03:21 AM 6.2 (H) 4.8 - 5.6 % Final    Comment:    (NOTE)         Pre-diabetes: 5.7 - 6.4         Diabetes: >6.4         Glycemic control for adults with diabetes: <7.0      Recent Results (from the past 240 hour(s))  MRSA PCR Screening     Status: None   Collection Time: 09/29/17  4:33 PM  Result Value Ref Range Status   MRSA by PCR NEGATIVE NEGATIVE Final    Comment:        The GeneXpert MRSA Assay (FDA approved for NASAL specimens only), is one component of a comprehensive MRSA colonization surveillance program. It is not intended to diagnose MRSA infection nor to guide or monitor treatment for MRSA infections. Performed at [facility], [place] [address], [place], [place] [address]      Scheduled  Meds: . atenolol  50 mg Oral BID  . escitalopram  20 mg Oral Daily  . finasteride  5 mg Oral Daily  . lactose free nutrition  237 mL Oral TID WC  . losartan  100 mg Oral Daily  . pantoprazole  40 mg Oral Daily  . pravastatin  40 mg Oral Daily  . tamsulosin  0.4 mg Oral Daily  . vitamin B-12  1,000 mcg Oral Daily   Continuous Infusions: . sodium chloride 10 mL/hr at 10/01/17 1716  . heparin 1,500 Units/hr (10/02/17 1307)     LOS: 3 days   Cherene Altes, MD [facility] Office  [phone] Pager - Text Page per Shea Evans as per below:  On-Call/Text Page:      [email or web address]      password [email or web address]  If 7PM-7AM, please contact night-coverage [email or web address] Password [email or web address] 10/02/2017, 4:10 PM

## 2017-10-02 NOTE — Plan of Care (Signed)
  Problem: Health Behavior/Discharge Planning: Goal: Ability to manage health-related needs will improve Outcome: Progressing  RN educated pt on POC including having a liver biopsy done during this hospitalization. Pt was re-educated on his diagnosis and what that possibly means for him moving forward. RN answered any questions pt had.

## 2017-10-02 NOTE — Progress Notes (Signed)
ANTICOAGULATION CONSULT NOTE - Follow Up Consult  Pharmacy Consult for Heparin Indication: DVT/PE  Allergies  Allergen Reactions  . Cardura [Doxazosin Mesylate] Other (See Comments)    dizziness   Patient Measurements: Height: 5\' 6"  (167.6 cm) Weight: 160 lb 4.4 oz (72.7 kg) IBW/kg (Calculated) : 63.8 Heparin Dosing Weight: 72.7 kg  Vital Signs: Temp: 99.1 F (37.3 C) (04/28 0511) Temp Source: Oral (04/28 0511) BP: 175/85 (04/28 0511) Pulse Rate: 69 (04/28 0511)  Labs: Recent Labs    09/29/17 1207  09/30/17 0225 10/01/17 0301 10/02/17 0319  HGB 13.1  --  11.9* 11.7* 11.3*  HCT 40.5  --  37.7* 36.8* 35.3*  PLT 164  --  166 175 175  HEPARINUNFRC  --    < > 0.30 0.31 0.50  CREATININE 1.19  --  1.06 1.15  --    < > = values in this interval not displayed.   Estimated Creatinine Clearance: 45.5 mL/min (by C-G formula based on SCr of 1.15 mg/dL).  Assessment: 73 yoM with new submassive PE w/ RHS, continues on IV heparin. Heparin level this AM remains within goal at 0.50. Hgb 11.3 stable, pltc WNL. No bleeding noted. Per oncology, would favor Xarelto or Eliquis for long term AC.  Goal of Therapy:  Heparin level 0.3-0.7 units/ml Monitor platelets by anticoagulation protocol: Yes   Plan:  Continue heparin gtt drip at 1500 units/hr Daily heparin level and CBC Monitor for s/sx of bleeding F/u plans for transition to [facility]  Thank you for allowing pharmacy to be a part of this patient's care.  Mila Merry Gerarda Fraction, PharmD PGY1 Pharmacy Resident Pager: [phone] 10/02/2017 7:13 AM

## 2017-10-03 ENCOUNTER — Other Ambulatory Visit (HOSPITAL_COMMUNITY): Payer: Medicare Other

## 2017-10-03 DIAGNOSIS — D509 Iron deficiency anemia, unspecified: Secondary | ICD-10-CM

## 2017-10-03 DIAGNOSIS — I82403 Acute embolism and thrombosis of unspecified deep veins of lower extremity, bilateral: Secondary | ICD-10-CM

## 2017-10-03 DIAGNOSIS — C801 Malignant (primary) neoplasm, unspecified: Secondary | ICD-10-CM

## 2017-10-03 LAB — COMPREHENSIVE METABOLIC PANEL
ALT: 41 U/L (ref 17–63)
AST: 49 U/L — ABNORMAL HIGH (ref 15–41)
Albumin: 3.2 g/dL — ABNORMAL LOW (ref 3.5–5.0)
Alkaline Phosphatase: 305 U/L — ABNORMAL HIGH (ref 38–126)
Anion gap: 9 (ref 5–15)
BUN: 19 mg/dL (ref 6–20)
CHLORIDE: 105 mmol/L (ref 101–111)
CO2: 21 mmol/L — ABNORMAL LOW (ref 22–32)
Calcium: 8.7 mg/dL — ABNORMAL LOW (ref 8.9–10.3)
Creatinine, Ser: 1.3 mg/dL — ABNORMAL HIGH (ref 0.61–1.24)
GFR calc Af Amer: 58 mL/min — ABNORMAL LOW (ref >60)
GFR, EST NON AFRICAN AMERICAN: 50 mL/min — AB (ref >60)
Glucose, Bld: 125 mg/dL — ABNORMAL HIGH (ref 65–99)
Potassium: 3.6 mmol/L (ref 3.5–5.1)
Sodium: 135 mmol/L (ref 135–145)
Total Bilirubin: 1.1 mg/dL (ref 0.3–1.2)
Total Protein: 6.1 g/dL — ABNORMAL LOW (ref 6.5–8.1)

## 2017-10-03 LAB — CBC
HEMATOCRIT: 34 % — AB (ref 39.0–52.0)
Hemoglobin: 10.9 g/dL — ABNORMAL LOW (ref 13.0–17.0)
MCH: 26.8 pg (ref 26.0–34.0)
MCHC: 32.1 g/dL (ref 30.0–36.0)
MCV: 83.5 fL (ref 78.0–100.0)
PLATELETS: 179 10*3/uL (ref 150–400)
RBC: 4.07 MIL/uL — AB (ref 4.22–5.81)
RDW: 14.7 % (ref 11.5–15.5)
WBC: 12 10*3/uL — AB (ref 4.0–10.5)

## 2017-10-03 LAB — HEPARIN LEVEL (UNFRACTIONATED): Heparin Unfractionated: 0.69 IU/mL (ref 0.30–0.70)

## 2017-10-03 LAB — CANCER ANTIGEN 19-9: CA 19-9: 46960 U/mL — ABNORMAL HIGH (ref 0–35)

## 2017-10-03 MED ORDER — SODIUM CHLORIDE 0.9 % IV SOLN
510.0000 mg | Freq: Once | INTRAVENOUS | Status: AC
  Administered 2017-10-03: 510 mg via INTRAVENOUS
  Filled 2017-10-03: qty 17

## 2017-10-03 MED ORDER — LOSARTAN POTASSIUM 50 MG PO TABS: 50.0000 mg | ORAL_TABLET | Freq: Every day | ORAL | Status: DC

## 2017-10-03 NOTE — Progress Notes (Signed)
ANTICOAGULATION CONSULT NOTE - Follow Up Consult  Pharmacy Consult for Heparin Indication: DVT/PE  Allergies  Allergen Reactions  . Cardura [Doxazosin Mesylate] Other (See Comments)    dizziness    Patient Measurements: Height: 5\' 6"  (167.6 cm) Weight: 160 lb 4.4 oz (72.7 kg) IBW/kg (Calculated) : 63.8 Heparin Dosing Weight: 72.7 kg  Vital Signs: Temp: 99 F (37.2 C) (04/29 0400) Temp Source: Oral (04/29 0400) BP: 168/92 (04/29 0400) Pulse Rate: 70 (04/29 0005)  Labs: Recent Labs    10/01/17 0301 10/02/17 0319 10/03/17 0204  HGB 11.7* 11.3* 10.9*  HCT 36.8* 35.3* 34.0*  PLT 175 175 179  HEPARINUNFRC 0.31 0.50 0.69  CREATININE 1.15  --   --     Estimated Creatinine Clearance: 45.5 mL/min (by C-G formula based on SCr of 1.15 mg/dL).  Assessment: 77 yom with new submassive PE w/ RHS asked to come to the ED. Pharmacy consulted to dose heparin.   Heparin level this morning remains therapeutic but on the higher end of the range and trending up (HL 0.69 << 0.5, goal of 0.3-0.7).  Hgb/Hct slight drop, plts wnl. No bleeding noted. Will decrease the drip rate slightly to keep within range.   Per Oncology, the plan is to switch to Eliquis after the patient's biopsy and port-a-cath are placed on Tuesday.   Goal of Therapy:  Heparin level 0.3-0.7 units/ml Monitor platelets by anticoagulation protocol: Yes   Plan:  - Decrease Heparin drip slightly to 1450 units/hr (14.5 ml/hr) - Will follow-up on plans to transition to oral anticoagulation - Will continue to monitor for any signs/symptoms of bleeding and will follow up with heparin level in the a.m.   Thank you for allowing pharmacy to be a part of this patient's care.  Alycia Rossetti, PharmD, BCPS Clinical Pharmacist Pager: [phone] 10/03/2017 7:25 AM

## 2017-10-03 NOTE — Progress Notes (Signed)
Mr. Cain he really is not too happy with being on all the cardiac monitors.  I really think that these cardiac monitors can be discontinued.  Had a fairly uneventful weekend.  I am not sure that they really are not helping and given that he is a DO NOT RESUSCITATE, I am not sure that the aggravation to the patient is worth it.  He seems to be eating okay.  He had no nausea or vomiting.  His tumor markers are not yet back.  He is iron deficient.  I will give him a dose of IV iron.  Apparently, his biopsy and Port-A-Cath will not be done until Tuesday.  I would keep him on heparin until then.  Once he finishes his invasive procedures, then I will switch him over to Eliquis.  He had Dopplers of his legs.To no surprise, he has bilateral lower extremity DVTs.  He clearly has a hypercoagulable state from his underlying malignancy.  I am unsure of this falls into the realm of Trousseau's syndrome, as this this is usually found to be  superficial thrombophlebitis.    He has had no fever.  Hopefully, we will be able to start treatment on him in a week or so.  I probably would utilize Abraxane/gemcitabine.  I know that the staff on [address] are doing a fantastic job with him.  Lattie Haw, MD  [address] [place] 3:9

## 2017-10-03 NOTE — Care Management Important Message (Signed)
Important Message  Patient Details  Name: HILLERY BHALLA MRN: [id number] Date of Birth: 05-Feb-1934   Medicare Important Message Given:  Yes    Shaunessy Dobratz 10/03/2017, 3:00 PM

## 2017-10-03 NOTE — Progress Notes (Signed)
[place] TEAM 1 - Stepdown/ICU TEAM  Austin Torres  KXF:[id number] DOB: 06/30/1933 DOA: 09/29/2017 PCP: Cassandria Anger, MD    Brief Narrative:  84 y.o. male w/ a hx of HTN; HLD; CVA; and BPH who experienced 1 month of SOB, unexplained weight loss, and fatigue.  His PCP ordered CT imaging of the chest and abdom (to evaluate liver masses noted on abdom US 4/16 obtained to eval diffuse abdom pain) which revealed a 5+ cm pancreatic tail adenocarcinoma; R subclavian vein DVT; diffuse hepatic metastases; and at least submassive PE.  Significant Events: 4/25 admit   Subjective: No new complaints today.  Resting comfortably in bed.  Denies current chest pain.  Has noted some intermittent wheezing but it is not particularly troublesome.  Denies nausea vomiting or abdominal pain.  Assessment & Plan:  PE - R subclavian DVT - B LE DVT  Continue IV heparin - hemodynamically stable at this time - B LE venous duplex note B LE clots - timing of heparin stoppage prior to bx to be determined by Pharmacy once appointment time in IR confirmed     Newly diagnosed probable metastatic Pancreatic Adenocarcinoma  porta-cath and liver bx on 4/30 - IR following - awaiting confirmation of timing of procedures to coordinate stopping of heparin gtt  Acute kidney injury  crt and BUN have been slowly rising - suspect this is simply due to pre-renal state w/ diminished intake - gently hydrate and recheck in AM   COPD Well compensated   HTN controlled  HLD Cont home medical tx   Hx of CVA (due to SVD) Jan 2018 On chronic Plavix as outpt - this was stopped at the time of his admission when heparin was begun - Pharmacy confirms he has not received Plavix while in the hospital   BPH  DVT prophylaxis: IV heparin  Code Status: DNR - NO CODE Family Communication: spoke w/ daughter at bedside   Disposition Plan: Med Surg   Consultants:  none  Antimicrobials:  none   Objective: Blood pressure  128/68, pulse 73, temperature (!) 97.4 F (36.3 C), resp. rate (!) 26, height 5\' 6"  (1.676 m), weight 72.7 kg (160 lb 4.4 oz), SpO2 95 %.  Intake/Output Summary (Last 24 hours) at 10/03/2017 1147 Last data filed at 10/03/2017 0600 Gross per 24 hour  Intake 165 ml  Output -  Net 165 ml   Filed Weights   09/29/17 1150 09/29/17 1600  Weight: 77.1 kg (170 lb) 72.7 kg (160 lb 4.4 oz)    Examination: General: NAD Lungs: CTA B w/o wheezing or crackles  Cardiovascular: RRR - no M  Abdomen: NT/ND, soft, bs+ Extremities: No edema B LE   CBC: Recent Labs  Lab 10/01/17 0301 10/02/17 0319 10/03/17 0204  WBC 13.0* 13.5* 12.0*  HGB 11.7* 11.3* 10.9*  HCT 36.8* 35.3* 34.0*  MCV 83.8 83.6 83.5  PLT 175 175 696   Basic Metabolic Panel: Recent Labs  Lab 09/30/17 0225 10/01/17 0301 10/03/17 0621  NA 135 136 135  K 4.0 3.8 3.6  CL 101 101 105  CO2 24 23 21*  GLUCOSE 131* 120* 125*  BUN 13 15 19   CREATININE 1.06 1.15 1.30*  CALCIUM 8.8* 8.7* 8.7*   GFR: Estimated Creatinine Clearance: 40.2 mL/min (A) (by C-G formula based on SCr of 1.3 mg/dL (H)).  Liver Function Tests: Recent Labs  Lab 09/29/17 1207 09/30/17 0225 10/01/17 0301 10/03/17 0621  AST 47* 42* 38 49*  ALT 34 33  31 41  ALKPHOS 331* 311* 292* 305*  BILITOT 1.2 1.0 0.9 1.1  PROT 6.6 6.0* 6.1* 6.1*  ALBUMIN 3.4* 3.2* 3.0* 3.2*    HbA1C: Hgb A1c MFr Bld  Date/Time Value Ref Range Status  12/24/2016 04:19 PM 6.4 4.6 - 6.5 % Final    Comment:    Glycemic Control Guidelines for People with Diabetes:Non Diabetic:  <6%Goal of Therapy: <7%Additional Action Suggested:  >8%   06/30/2016 03:21 AM 6.2 (H) 4.8 - 5.6 % Final    Comment:    (NOTE)         Pre-diabetes: 5.7 - 6.4         Diabetes: >6.4         Glycemic control for adults with diabetes: <7.0      Recent Results (from the past 240 hour(s))  MRSA PCR Screening     Status: None   Collection Time: 09/29/17  4:33 PM  Result Value Ref Range Status    MRSA by PCR NEGATIVE NEGATIVE Final    Comment:        The GeneXpert MRSA Assay (FDA approved for NASAL specimens only), is one component of a comprehensive MRSA colonization surveillance program. It is not intended to diagnose MRSA infection nor to guide or monitor treatment for MRSA infections. Performed at [facility], [place] [address], [place], [place] [address]      Scheduled Meds: . atenolol  50 mg Oral BID  . escitalopram  20 mg Oral Daily  . finasteride  5 mg Oral Daily  . lactose free nutrition  237 mL Oral TID WC  . losartan  100 mg Oral Daily  . pantoprazole  40 mg Oral Daily  . tamsulosin  0.4 mg Oral Daily  . vitamin B-12  1,000 mcg Oral Daily   Continuous Infusions: . sodium chloride 75 mL/hr at 10/03/17 1118  . heparin 1,450 Units/hr (10/03/17 0744)     LOS: 4 days   Cherene Altes, MD [facility] Office  [phone] Pager - Text Page per Shea Evans as per below:  On-Call/Text Page:      [email or web address]      password [email or web address]  If 7PM-7AM, please contact night-coverage [email or web address] Password [facility] 10/03/2017, 11:47 AM

## 2017-10-04 ENCOUNTER — Inpatient Hospital Stay (HOSPITAL_COMMUNITY): Payer: Medicare Other

## 2017-10-04 DIAGNOSIS — R350 Frequency of micturition: Secondary | ICD-10-CM

## 2017-10-04 DIAGNOSIS — N401 Enlarged prostate with lower urinary tract symptoms: Secondary | ICD-10-CM

## 2017-10-04 HISTORY — PX: IR US GUIDE BX ASP/DRAIN: IMG2392

## 2017-10-04 HISTORY — PX: IR FLUORO GUIDE PORT INSERTION RIGHT: IMG5741

## 2017-10-04 HISTORY — PX: IR US GUIDE VASC ACCESS RIGHT: IMG2390

## 2017-10-04 LAB — HEPARIN LEVEL (UNFRACTIONATED): Heparin Unfractionated: 0.31 IU/mL (ref 0.30–0.70)

## 2017-10-04 LAB — COMPREHENSIVE METABOLIC PANEL
ALT: 44 U/L (ref 17–63)
ANION GAP: 13 (ref 5–15)
AST: 50 U/L — ABNORMAL HIGH (ref 15–41)
Albumin: 2.9 g/dL — ABNORMAL LOW (ref 3.5–5.0)
Alkaline Phosphatase: 320 U/L — ABNORMAL HIGH (ref 38–126)
BUN: 15 mg/dL (ref 6–20)
CHLORIDE: 106 mmol/L (ref 101–111)
CO2: 19 mmol/L — AB (ref 22–32)
CREATININE: 1.16 mg/dL (ref 0.61–1.24)
Calcium: 8.7 mg/dL — ABNORMAL LOW (ref 8.9–10.3)
GFR calc non Af Amer: 57 mL/min — ABNORMAL LOW (ref >60)
Glucose, Bld: 130 mg/dL — ABNORMAL HIGH (ref 65–99)
Potassium: 3.7 mmol/L (ref 3.5–5.1)
SODIUM: 138 mmol/L (ref 135–145)
Total Bilirubin: 1.3 mg/dL — ABNORMAL HIGH (ref 0.3–1.2)
Total Protein: 5.8 g/dL — ABNORMAL LOW (ref 6.5–8.1)

## 2017-10-04 LAB — CBC
HCT: 33.8 % — ABNORMAL LOW (ref 39.0–52.0)
Hemoglobin: 10.8 g/dL — ABNORMAL LOW (ref 13.0–17.0)
MCH: 26.9 pg (ref 26.0–34.0)
MCHC: 32 g/dL (ref 30.0–36.0)
MCV: 84.1 fL (ref 78.0–100.0)
Platelets: 180 10*3/uL (ref 150–400)
RBC: 4.02 MIL/uL — AB (ref 4.22–5.81)
RDW: 15 % (ref 11.5–15.5)
WBC: 12 10*3/uL — ABNORMAL HIGH (ref 4.0–10.5)

## 2017-10-04 LAB — PROTIME-INR
INR: 1.16
Prothrombin Time: 14.7 seconds (ref 11.4–15.2)

## 2017-10-04 MED ORDER — LIDOCAINE-EPINEPHRINE (PF) 1 %-1:200000 IJ SOLN
INTRAMUSCULAR | Status: AC
  Filled 2017-10-04: qty 30

## 2017-10-04 MED ORDER — MIDAZOLAM HCL 2 MG/2ML IJ SOLN
INTRAMUSCULAR | Status: AC
  Filled 2017-10-04: qty 4

## 2017-10-04 MED ORDER — MIDAZOLAM HCL 2 MG/2ML IJ SOLN
INTRAMUSCULAR | Status: AC | PRN
  Administered 2017-10-04: 1 mg via INTRAVENOUS
  Administered 2017-10-04: 0.5 mg via INTRAVENOUS

## 2017-10-04 MED ORDER — APIXABAN 5 MG PO TABS: 5.0000 mg | ORAL_TABLET | Freq: Two times a day (BID) | ORAL | Status: DC

## 2017-10-04 MED ORDER — DRONABINOL 2.5 MG PO CAPS
2.5000 mg | ORAL_CAPSULE | Freq: Two times a day (BID) | ORAL | Status: DC
  Administered 2017-10-04 – 2017-10-05 (×2): 2.5 mg via ORAL
  Filled 2017-10-04 (×2): qty 1

## 2017-10-04 MED ORDER — LIDOCAINE-EPINEPHRINE (PF) 2 %-1:200000 IJ SOLN
INTRAMUSCULAR | Status: AC | PRN
  Administered 2017-10-04: 20 mL

## 2017-10-04 MED ORDER — HYDROCODONE-HOMATROPINE 5-1.5 MG/5ML PO SYRP
5.0000 mL | ORAL_SOLUTION | ORAL | Status: DC | PRN
  Administered 2017-10-04: 5 mL via ORAL
  Filled 2017-10-04: qty 5

## 2017-10-04 MED ORDER — HEPARIN SOD (PORK) LOCK FLUSH 100 UNIT/ML IV SOLN
INTRAVENOUS | Status: AC
  Administered 2017-10-04: 500 [IU]
  Filled 2017-10-04: qty 5

## 2017-10-04 MED ORDER — LOSARTAN POTASSIUM 50 MG PO TABS
100.0000 mg | ORAL_TABLET | Freq: Every day | ORAL | Status: DC
  Administered 2017-10-05: 100 mg via ORAL
  Filled 2017-10-04: qty 2

## 2017-10-04 MED ORDER — FENTANYL CITRATE (PF) 100 MCG/2ML IJ SOLN
INTRAMUSCULAR | Status: AC | PRN
  Administered 2017-10-04: 50 ug via INTRAVENOUS
  Administered 2017-10-04: 25 ug via INTRAVENOUS

## 2017-10-04 MED ORDER — LIDOCAINE HCL 1 % IJ SOLN
INTRAMUSCULAR | Status: AC
  Filled 2017-10-04: qty 20

## 2017-10-04 MED ORDER — FENTANYL CITRATE (PF) 100 MCG/2ML IJ SOLN
INTRAMUSCULAR | Status: AC
  Filled 2017-10-04: qty 4

## 2017-10-04 MED ORDER — HYDROCODONE-ACETAMINOPHEN 5-325 MG PO TABS: 1.0000 | ORAL_TABLET | ORAL | Status: DC | PRN

## 2017-10-04 MED ORDER — CEFAZOLIN SODIUM-DEXTROSE 2-4 GM/100ML-% IV SOLN
2.0000 g | Freq: Once | INTRAVENOUS | Status: AC
  Administered 2017-10-04: 2 g via INTRAVENOUS
  Filled 2017-10-04: qty 100

## 2017-10-04 MED ORDER — APIXABAN 5 MG PO TABS: 10.0000 mg | ORAL_TABLET | Freq: Two times a day (BID) | ORAL | Status: DC

## 2017-10-04 MED ORDER — APIXABAN 5 MG PO TABS
10.0000 mg | ORAL_TABLET | Freq: Two times a day (BID) | ORAL | Status: DC
  Administered 2017-10-05: 10 mg via ORAL
  Filled 2017-10-04: qty 2

## 2017-10-04 MED ORDER — LIDOCAINE HCL (PF) 1 % IJ SOLN
INTRAMUSCULAR | Status: AC | PRN
  Administered 2017-10-04: 10 mL

## 2017-10-04 MED ORDER — CEFAZOLIN SODIUM-DEXTROSE 2-4 GM/100ML-% IV SOLN
INTRAVENOUS | Status: AC
  Filled 2017-10-04: qty 100

## 2017-10-04 NOTE — Procedures (Signed)
  Procedure: R IJ Port   US liver lesion core biopsy 18g x3 EBL:   minimal Complications:  none immediate  See full dictation in [facility].  Dillard Cannon MD Main # [phone] Pager  [phone]

## 2017-10-04 NOTE — Sedation Documentation (Signed)
Proceeding with second procedure.

## 2017-10-04 NOTE — Progress Notes (Signed)
He is feeling a little bit better today.  He is going for his biopsy and his Port-A-Cath today.  Again, he has pancreatic cancer by the elevated CA 19-9 of 45,000.  We did go ahead and give him some iron.  Hopefully this will help him out a little bit.  He still is bothered by the fact that he cannot move around all that much.  He is on the heparin drip.  Once he completes the invasive procedures, I would probably get him on Eliquis.  I think this would be reasonable.  Pharmacy can certainly does this for [place].  His appetite is not that great.  I will try him on some Marinol.  His labs look fairly good.  His hemoglobin is 10.8.  His blood counts 180,000.  His bilirubin is 1.3.  His creatinine is 1.16.  We did talk about his end-of-life request.  Again he is not want to be kept alive on machines.  He does not want to "be a bother to my family."  He just wants to pass peacefully when the time comes.  Hopefully, he will be able to go home tomorrow.  He really wants to be able to go home.  I am little worried about his albumin dropping.  Again this clearly reflects his malignancy.  Lattie Haw, MD

## 2017-10-04 NOTE — Sedation Documentation (Signed)
Completed first procedure Image guided placement of tunneled catheter with port. Currently preparing second procedure.

## 2017-10-04 NOTE — Discharge Instructions (Addendum)
Information on my medicine - ELIQUIS (apixaban)  Why was Eliquis prescribed for you? Eliquis was prescribed to treat blood clots that may have been found in the veins of your legs (deep vein thrombosis) or in your lungs (pulmonary embolism) and to reduce the risk of them occurring again.  What do You need to know about Eliquis ? The starting dose is 10 mg (two 5 mg tablets) taken TWICE daily for the FIRST SEVEN (7) DAYS, then on 5/8  the dose is reduced to ONE 5 mg tablet taken TWICE daily.  Eliquis may be taken with or without food.   Try to take the dose about the same time in the morning and in the evening. If you have difficulty swallowing the tablet whole please discuss with your pharmacist how to take the medication safely.  Take Eliquis exactly as prescribed and DO NOT stop taking Eliquis without talking to the doctor who prescribed the medication.  Stopping may increase your risk of developing a new blood clot.  Refill your prescription before you run out.  After discharge, you should have regular check-up appointments with your healthcare provider that is prescribing your Eliquis.    What do you do if you miss a dose? If a dose of ELIQUIS is not taken at the scheduled time, take it as soon as possible on the same day and twice-daily administration should be resumed. The dose should not be doubled to make up for a missed dose.  Important Safety Information A possible side effect of Eliquis is bleeding. You should call your healthcare provider right away if you experience any of the following: ? Bleeding from an injury or your nose that does not stop. ? Unusual colored urine (red or dark brown) or unusual colored stools (red or black). ? Unusual bruising for unknown reasons. ? A serious fall or if you hit your head (even if there is no bleeding).  Some medicines may interact with Eliquis and might increase your risk of bleeding or clotting while on Eliquis. To help avoid  this, consult your healthcare provider or pharmacist prior to using any new prescription or non-prescription medications, including herbals, vitamins, non-steroidal anti-inflammatory drugs (NSAIDs) and supplements.  This website has more information on Eliquis (apixaban): http://www.eliquis.com/eliquis/home

## 2017-10-04 NOTE — Care Management Note (Signed)
Case Management Note  Patient Details  Name: Austin Torres MRN: [id number] Date of Birth: 10-Jul-1933  Subjective/Objective:   From home with spouse, presents with submassive pe, r sublcavian dvt, hepatic metastases, pancreatic tail adenocarcinoma, IR to day for liver bx and portacath.  Eliquis is the drug of choice.  NCM left 30 day savings coupon for patient in his room, he is in procedure.                 Action/Plan: DC home when ready.  Expected Discharge Date:  10/04/17               Expected Discharge Plan:  Home/Self Care  In-House Referral:     Discharge planning Services  CM Consult, Medication Assistance  Post Acute Care Choice:    Choice offered to:     DME Arranged:    DME Agency:     HH Arranged:    HH Agency:     Status of Service:  In process, will continue to follow  If discussed at Long Length of Stay Meetings, dates discussed:    Additional Comments:  Zenon Mayo, RN 10/04/2017, 3:18 PM

## 2017-10-04 NOTE — Progress Notes (Signed)
[facility] TEAM 1 - Stepdown/ICU TEAM  Austin Torres  EQA:[id number] DOB: 06-09-1933 DOA: 09/29/2017 PCP: Cassandria Anger, MD    Brief Narrative:  84 y.o. male w/ a hx of HTN; HLD; CVA; and BPH who experienced 1 month of SOB, unexplained weight loss, and fatigue.  His PCP ordered CT imaging of the chest and abdom (to evaluate liver masses noted on abdom US 4/16 obtained to eval diffuse abdom pain) which revealed a 5+ cm pancreatic tail adenocarcinoma; R subclavian vein DVT; diffuse hepatic metastases; and at least submassive PE.  Significant Events: 4/25 admit   Subjective: Reports that he is ready to get his procedures completed.  Denies cp, sob, n/v, or abdom pain.  Is motivated to get home asap.    Will plan to begin eliquis per Pharmacy after his procedures.   Assessment & Plan:  PE - R subclavian DVT - B LE DVT  Continue IV heparin until on call for procedures - hemodynamically - B LE venous duplex noted B LE clots - timing of heparin stoppage prior to bx to be determined by Pharmacy once appointment time in IR confirmed - plan to resume anticoag after procedures completed utilizing Eliquis - will need to watch for bleeding complications post bx on anticoag    Newly diagnosed probable metastatic Pancreatic Adenocarcinoma  porta-cath and liver bx on 4/30 - IR following - Oncology following and will arrange for outpt f/u when ready for d/c home   Acute kidney injury  crt and BUN were on a slow upward trend - suspect this was due to pre-renal state w/ diminished intake - crt trending back down w/ modest volume expansion  Recent Labs  Lab 09/29/17 1207 09/30/17 0225 10/01/17 0301 10/03/17 0621 10/04/17 0330  CREATININE 1.19 1.06 1.15 1.30* 1.16    COPD Well compensated   HTN Elevation today likely due to pt's agitation at waiting for his procedure - follow w/o change in med a this time   HLD Cont home medical tx   Hx of CVA (due to SVD) Jan 2018 On chronic Plavix  as outpt - this was stopped at the time of his admission when heparin was begun - Pharmacy confirms he has not received Plavix while in the hospital - will NOT utilize ASA or Plavix while on Eliquis   BPH Sx well controlled   DVT prophylaxis: IV heparin  Code Status: DNR - NO CODE Family Communication: no family present at time of exam today    Disposition Plan: Med Surg - possible d/c home 5/1 if Hgb stable and not other complications s/p liver bx and porta-cath placement   Consultants:  Oncology IR  Antimicrobials:  none   Objective: Blood pressure (!) 173/85, pulse 64, temperature 98.3 F (36.8 C), temperature source Oral, resp. rate 18, height 5\' 6"  (1.676 m), weight 72.7 kg (160 lb 4.4 oz), SpO2 96 %.  Intake/Output Summary (Last 24 hours) at 10/04/2017 1443 Last data filed at 10/04/2017 0900 Gross per 24 hour  Intake 1848.01 ml  Output 1700 ml  Net 148.01 ml   Filed Weights   09/29/17 1150 09/29/17 1600  Weight: 77.1 kg (170 lb) 72.7 kg (160 lb 4.4 oz)    Examination: General: NAD - alert - agitated at wait for procedures  Lungs: CTA B Cardiovascular: RRR Abdomen: NT/ND, soft, bs+ Extremities: no edema B LE   CBC: Recent Labs  Lab 10/02/17 0319 10/03/17 0204 10/04/17 0330  WBC 13.5* 12.0* 12.0*  HGB 11.3*  10.9* 10.8*  HCT 35.3* 34.0* 33.8*  MCV 83.6 83.5 84.1  PLT 175 179 093   Basic Metabolic Panel: Recent Labs  Lab 10/01/17 0301 10/03/17 0621 10/04/17 0330  NA 136 135 138  K 3.8 3.6 3.7  CL 101 105 106  CO2 23 21* 19*  GLUCOSE 120* 125* 130*  BUN 15 19 15   CREATININE 1.15 1.30* 1.16  CALCIUM 8.7* 8.7* 8.7*   GFR: Estimated Creatinine Clearance: 45.1 mL/min (by C-G formula based on SCr of 1.16 mg/dL).  Liver Function Tests: Recent Labs  Lab 09/30/17 0225 10/01/17 0301 10/03/17 0621 10/04/17 0330  AST 42* 38 49* 50*  ALT 33 31 41 44  ALKPHOS 311* 292* 305* 320*  BILITOT 1.0 0.9 1.1 1.3*  PROT 6.0* 6.1* 6.1* 5.8*  ALBUMIN 3.2* 3.0*  3.2* 2.9*    HbA1C: Hgb A1c MFr Bld  Date/Time Value Ref Range Status  12/24/2016 04:19 PM 6.4 4.6 - 6.5 % Final    Comment:    Glycemic Control Guidelines for People with Diabetes:Non Diabetic:  <6%Goal of Therapy: <7%Additional Action Suggested:  >8%   06/30/2016 03:21 AM 6.2 (H) 4.8 - 5.6 % Final    Comment:    (NOTE)         Pre-diabetes: 5.7 - 6.4         Diabetes: >6.4         Glycemic control for adults with diabetes: <7.0      Recent Results (from the past 240 hour(s))  MRSA PCR Screening     Status: None   Collection Time: 09/29/17  4:33 PM  Result Value Ref Range Status   MRSA by PCR NEGATIVE NEGATIVE Final    Comment:        The GeneXpert MRSA Assay (FDA approved for NASAL specimens only), is one component of a comprehensive MRSA colonization surveillance program. It is not intended to diagnose MRSA infection nor to guide or monitor treatment for MRSA infections. Performed at [facility], [place] [address] [place], [place] [address]      Scheduled Meds: . atenolol  50 mg Oral BID  . dronabinol  2.5 mg Oral BID AC  . escitalopram  20 mg Oral Daily  . fentaNYL      . finasteride  5 mg Oral Daily  . heparin lock flush      . lactose free nutrition  237 mL Oral TID WC  . lidocaine      . lidocaine-EPINEPHrine      . [START ON 10/05/2017] losartan  50 mg Oral Daily  . midazolam      . pantoprazole  40 mg Oral Daily  . tamsulosin  0.4 mg Oral Daily  . vitamin B-12  1,000 mcg Oral Daily   Continuous Infusions: . sodium chloride 25 mL/hr at 10/04/17 0622  . ceFAZolin    .  ceFAZolin (ANCEF) IV 2 g (10/04/17 1432)  . heparin Stopped (10/04/17 0700)     LOS: 5 days   Cherene Altes, MD [facility] Office  [phone] Pager - Text Page per Amion as per below:  On-Call/Text Page:      [email or web address]      password [email or web address]  If 7PM-7AM, please contact night-coverage [email or web address] Password [facility] 10/04/2017, 2:43 PM

## 2017-10-04 NOTE — Progress Notes (Addendum)
ANTICOAGULATION CONSULT NOTE - Follow Up Consult  Pharmacy Consult for Eliquis Indication: DVT/PE  Allergies  Allergen Reactions  . Cardura [Doxazosin Mesylate] Other (See Comments)    dizziness    Patient Measurements: Height: 5\' 6"  (167.6 cm) Weight: 160 lb 4.4 oz (72.7 kg) IBW/kg (Calculated) : 63.8 Heparin Dosing Weight: 72.7 kg  Vital Signs: Temp: 98.3 F (36.8 C) (04/30 0742) Temp Source: Oral (04/30 0742) BP: 163/86 (04/30 1450) Pulse Rate: 68 (04/30 1450)  Labs: Recent Labs    10/02/17 0319 10/03/17 0204 10/03/17 0621 10/04/17 0330  HGB 11.3* 10.9*  --  10.8*  HCT 35.3* 34.0*  --  33.8*  PLT 175 179  --  180  LABPROT  --   --   --  14.7  INR  --   --   --  1.16  HEPARINUNFRC 0.50 0.69  --  0.31  CREATININE  --   --  1.30* 1.16    Estimated Creatinine Clearance: 45.1 mL/min (by C-G formula based on SCr of 1.16 mg/dL).  Assessment: 11 yom with new submassive PE w/ RHS asked to come to the ED. On heparin for acute PE likely from underlying malignancy. HL 0.69 and trended up - will reduce slightly to keep within range. Per oncology, planning to transition to Apixaban after PAC and biopsy done on Tues, 4/30 (waiting for plavix washout - was on PTA)  Goal of Therapy:  Heparin level 0.3-0.7 units/ml Monitor platelets by anticoagulation protocol: Yes   Plan:  Start Eliquis 10mg  PO BID x 7 days tomorrow morning, then switch to 5mg  PO BID Monitor CBC, s/s of bleed  Elenor Quinones, PharmD, BCPS Clinical Pharmacist Pager [phone] 10/04/2017 2:57 PM

## 2017-10-05 ENCOUNTER — Encounter (HOSPITAL_COMMUNITY): Payer: Self-pay | Admitting: Interventional Radiology

## 2017-10-05 ENCOUNTER — Other Ambulatory Visit: Payer: Self-pay | Admitting: Hematology & Oncology

## 2017-10-05 DIAGNOSIS — I749 Embolism and thrombosis of unspecified artery: Secondary | ICD-10-CM

## 2017-10-05 LAB — CBC
HCT: 33 % — ABNORMAL LOW (ref 39.0–52.0)
Hemoglobin: 10.7 g/dL — ABNORMAL LOW (ref 13.0–17.0)
MCH: 27.2 pg (ref 26.0–34.0)
MCHC: 32.4 g/dL (ref 30.0–36.0)
MCV: 83.8 fL (ref 78.0–100.0)
PLATELETS: 146 10*3/uL — AB (ref 150–400)
RBC: 3.94 MIL/uL — ABNORMAL LOW (ref 4.22–5.81)
RDW: 15 % (ref 11.5–15.5)
WBC: 11 10*3/uL — ABNORMAL HIGH (ref 4.0–10.5)

## 2017-10-05 LAB — COMPREHENSIVE METABOLIC PANEL
ALT: 41 U/L (ref 17–63)
AST: 50 U/L — ABNORMAL HIGH (ref 15–41)
Albumin: 2.8 g/dL — ABNORMAL LOW (ref 3.5–5.0)
Alkaline Phosphatase: 316 U/L — ABNORMAL HIGH (ref 38–126)
Anion gap: 12 (ref 5–15)
BUN: 17 mg/dL (ref 6–20)
CHLORIDE: 103 mmol/L (ref 101–111)
CO2: 22 mmol/L (ref 22–32)
Calcium: 8.7 mg/dL — ABNORMAL LOW (ref 8.9–10.3)
Creatinine, Ser: 1.05 mg/dL (ref 0.61–1.24)
Glucose, Bld: 107 mg/dL — ABNORMAL HIGH (ref 65–99)
POTASSIUM: 3.2 mmol/L — AB (ref 3.5–5.1)
Sodium: 137 mmol/L (ref 135–145)
Total Bilirubin: 1 mg/dL (ref 0.3–1.2)
Total Protein: 5.5 g/dL — ABNORMAL LOW (ref 6.5–8.1)

## 2017-10-05 MED ORDER — ONDANSETRON HCL 4 MG PO TABS: 4.0000 mg | ORAL_TABLET | Freq: Four times a day (QID) | ORAL | 0 refills | Status: DC | PRN

## 2017-10-05 MED ORDER — DRONABINOL 2.5 MG PO CAPS: 2.5000 mg | ORAL_CAPSULE | Freq: Two times a day (BID) | ORAL | 0 refills | Status: DC

## 2017-10-05 MED ORDER — APIXABAN 5 MG PO TABS: 10.0000 mg | ORAL_TABLET | Freq: Two times a day (BID) | ORAL | 0 refills | Status: DC

## 2017-10-05 MED ORDER — BENZONATATE 200 MG PO CAPS: 200.0000 mg | ORAL_CAPSULE | Freq: Three times a day (TID) | ORAL | 0 refills | Status: DC | PRN

## 2017-10-05 MED ORDER — HYDROCODONE-ACETAMINOPHEN 5-325 MG PO TABS: 1.0000 | ORAL_TABLET | ORAL | 0 refills | Status: DC | PRN

## 2017-10-05 MED ORDER — APIXABAN 5 MG PO TABS: 5.0000 mg | ORAL_TABLET | Freq: Two times a day (BID) | ORAL | 0 refills | Status: DC

## 2017-10-05 NOTE — Care Management Note (Addendum)
Case Management Note  Patient Details  Name: JOAO MCCURDY MRN: [id number] Date of Birth: 07-Apr-1934  Subjective/Objective:  From home with spouse, presents with submassive pe, r sublcavian dvt, hepatic metastases, pancreatic tail adenocarcinoma, IR to day for liver bx and portacath.  Eliquis is the drug of choice.  NCM left 30 day savings coupon for patient in his room, he is in procedure.  RN [facility] states daughter Gerald Stabs would like to get hospital bed , bsc, and rolling walker for patient.  NCM spoke with patient about this and he agreed and would like to work with [facility], referral made to [place] with [facility].  They will bring rolling walker and bsc to patient's room prior to dc and the bed will be delivered to patient's home.  Daughter Chirs [phone] is contact for delivery. NCM informed patient about eliquis, and that his copays will be 45.00 for refills.                               Action/Plan: DC home when ready.  Expected Discharge Date:  10/05/17               Expected Discharge Plan:  Home/Self Care  In-House Referral:     Discharge planning Services  CM Consult, Medication Assistance  Post Acute Care Choice:  Durable Medical Equipment Choice offered to:  Patient  DME Arranged:  Bedside commode, Hospital bed, Walker rolling DME Agency:  [place]:  NA [place] Agency:  NA  Status of Service:  Completed, signed off  If discussed at [place] of Stay Meetings, dates discussed:    Additional Comments:  Zenon Mayo, RN 10/05/2017, 12:08 PM

## 2017-10-05 NOTE — Progress Notes (Signed)
START ON PATHWAY REGIMEN - Pancreatic     A cycle is every 28 days:     Nab-paclitaxel (protein bound)      Gemcitabine   **Always confirm dose/schedule in your pharmacy ordering system**    Patient Characteristics: Adenocarcinoma, Metastatic Disease, First Line, PS = 0, 1 Histology: Adenocarcinoma Current evidence of distant metastases<= Yes AJCC T Category: T3 AJCC N Category: N1 AJCC M Category: M1 AJCC 8 Stage Grouping: IV Line of Therapy: First Line  Intent of Therapy: Non-Curative / Palliative Intent, Discussed with Patient

## 2017-10-05 NOTE — Progress Notes (Signed)
Hopefully, Mr. vandenberghe will be able to go home today.  He had a liver biopsy yesterday.  He had a Port-A-Cath placed yesterday.  He now is on  Eliquis.  I think this would be reasonable for him in the thromboembolic disease.  I do not expect that we will get his pathology back until tomorrow.  Once I get the pathology back, I will send the specimen off for analysis.  His labs look pretty stable today.  His white cell count is 11.  Hemoglobin 10.7.  Platelet count 146,000.  His creatinine is 1.05.  His bilirubin is 1.  I started him on some Marinol yesterday.  His appetite might be a little bit better.  There is been no bleeding.  He now has a condom cath off him.  His vital signs are all stable.  His blood pressure is 136/67.  There really is no change on his overall physical exam.  We will see about getting him back to the office next week.  Hopefully will start treatment on him.  I would utilize Abraxane/Gemzar.  Thankfully, he is in good shape.  He should be able to tolerate treatment pretty well.  Lattie Haw, MD  Psalm 56:4

## 2017-10-05 NOTE — Progress Notes (Signed)
patient with wife and daughter at bedside given d/c paper work. All pages reviewed and questions answered. Periferal IV's d/ced without complication. DMEs for equipment checked and arrangement for delivery clarified.   English as a second language teacher

## 2017-10-05 NOTE — Consult Note (Signed)
            [facility] CM Primary Care Navigator  10/05/2017  Austin Torres 02-20-1934 [id number]   Went to seepatient at the bedside to identify possible discharge needsbuthe was already dischargedhometoday perstaffreport.  Per MD note, patient presented to the hospital after being sent by his primary care provider based on an abnormal CT result. He was evaluated for submassive pulmonary embolism, right subclavian deep vein thrombosis, pancreatic tail adenocarcinoma with liver metastasis.  Primary care provider's officeis listed as providingtransition of care (TOC)follow-up.   Patient has discharge instruction to follow-up with primary care provider in 1- 2 weeks after discharge.   For additional questions please contact:  Edwena Felty A. Altair Appenzeller, BSN, RN-BC [facility] PRIMARY CARE Navigator Cell: [phone]

## 2017-10-05 NOTE — Discharge Summary (Signed)
Discharge Summary  Austin Torres BTD:[id number] DOB: 1933-11-15  PCP: Cassandria Anger, MD  Admit date: 09/29/2017 Discharge date:    Time spent: < 25 minutes  Admitted From: Home Disposition: Home Recommendations for Outpatient Follow-up:  1. Follow up with PCP/oncology in 1-2 weeks. 2. Discontinue home aspirin and Plavix.  Started on Eliquis (2 mg twice daily x7 days, followed by 5 mg twice daily starting on 5/8) 3. Please follow up on the following pending results: Liver biopsy  Home Health: No Equipment/Devices: None  Discharge Diagnoses:  Active Hospital Problems   Diagnosis Date Noted  . Pulmonary embolism ([place]) 09/29/2017  . Pancreatic cancer metastasized to liver ([place]) 09/30/2017  . Liver metastasis ([place]) 09/29/2017  . Abdominal pain 09/12/2017  . COPD GOLD I  07/30/2015  . Goals of care, counseling/discussion 01/10/2011  . B12 deficiency 11/24/2009  . BPH (benign prostatic hyperplasia) 12/26/2006  . Essential hypertension 12/26/2006    Resolved Hospital Problems  No resolved problems to display.    Discharge Condition: Stable  CODE STATUS: DNR Diet recommendation: Heart Healthy   Vitals:   10/04/17 2359 10/05/17 0921  BP: 136/67 (!) 163/75  Pulse: 65 (!) 58  Resp:  15  Temp: 97.8 F (36.6 C) 98.3 F (36.8 C)  SpO2: 92% 94%    History of present illness:  Austin Torres is a 84 y.o. year old male with medical history significant for hypertension, hyperlipidemia, history of CVA, BPH who presented on 09/29/2017 as recommended by PCP for abnormal CT abdomen findings in the setting of 1 month of shortness of breath, unexplained weight loss and fatigue and was found to have pancreatic adenocarcinoma. Remaining hospital course addressed in problem based format below:   Hospital Course:  Principal Problem:   Pulmonary embolism ([place]) Active Problems:   B12 deficiency   Essential hypertension   BPH (benign prostatic hyperplasia)   Goals of care,  counseling/discussion   COPD GOLD I    Abdominal pain   Liver metastasis ([facility])   Pancreatic cancer metastasized to liver [facility])   #Metastatic pancreatic cancer, newly diagnosed Current hospitalization and preceded by 1 month of shortness of breath, unexplained weight loss and fatigue.  Outpatient CT abdominal/chest obtained by PCP for further evaluation of liver masses noted on abdominal ultrasound on 4/16 revealed  5+ centimeters pancreatic tail adenocarcinoma, diffuse hepatic metastases, right subclavian vein DVT and acute bilateral PE with CT evidence of right heart strain.  Consistent with pancreatic adeno arcinoma given CA 19-9: 46,160. Liver biopsy (pending on discharge) was performed and Port-A-Cath placed for initiation of outpatient chemotherapy treatment, oncology to arrange further follow-up after discharge  #Acute bilateral PE with right heart strain, right subclavian DVT, bilateral lower extremity DVT.  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  Initially started on IV heparin.  Transitioned to oral elquis with no complications of bleeding.  Patient's home Plavix and aspirin were discontinued prior to discharge  #History of CVA (06/2016)  Chronic Plavix and aspirin were discontinued in the setting of Xarelto initiation during hospitalization    Antibiotics:none  Microbiology:none Consultations:  Oncology(Dr. Marin Olp), Interventional Radiology ( Dr. Vernard Gambles)   Procedures/Studies: -Ultrasound liver lesion core biopsy 4/30 -Port insertion on 4/30 by IR  TTE (09/30/2017), EF 10-62%, grade 1 diastolic dysfunction, pulmonary artery systolic pressures mildly increased, PA peak pressure 45 mmHg  Vascular ultrasound lower extremity venous bilateral (10/01/2017): Acute DVT in the right common femoral vein, acute DVT in left posterior tibial vein and  peroneal veins  Ct Abdomen Pelvis W Wo Contrast  Result Date: 09/29/2017 CLINICAL DATA:   Right upper quadrant abdominal pain, weight loss, and anorexia. Hepatic masses on recent ultrasound. EXAM: CT CHEST WITH CONTRAST CT ABDOMEN AND PELVIS WITH AND WITHOUT CONTRAST TECHNIQUE: Multidetector CT imaging of the chest was performed during intravenous contrast administration. Multidetector CT imaging of the abdomen and pelvis was performed following the standard protocol before and during bolus administration of intravenous contrast. CONTRAST:  186mL ISOVUE-300 IOPAMIDOL (ISOVUE-300) INJECTION 61% COMPARISON:  Chest CT on 10/04/2016 and AP CT on 04/05/2016 FINDINGS: CT CHEST FINDINGS Cardiovascular: Pulmonary embolism is seen in both the right and left pulmonary arteries and old bilateral lobar branches. No saddle embolus seen. RV/LV ratio is 0.98, consistent with right heart strain. Thrombosis of the right subclavian vein is seen with adjacent soft tissue edema. Mediastinum/Lymph Nodes: No masses or pathologically enlarged lymph nodes identified. Lungs/Pleura: 7 mm pulmonary nodule in the posterior right lower lobe and 5 mm pulmonary nodule in the superior left lower lobe are stable since previous studies, consistent with benign etiology. No suspicious pulmonary nodules or masses identified. No evidence of pulmonary infiltrate or pleural effusion. Musculoskeletal:  No suspicious bone lesions identified. CT ABDOMEN AND PELVIS FINDINGS Hepatobiliary: Numerous rim enhancing hypovascular masses are seen throughout the right and left hepatic lobes which are new since previous study, consistent with diffuse liver metastases. Largest mass in the medial segment left lobe measures 7.1 x 5.5 cm on image 19/4. Gallbladder is unremarkable. No evidence of biliary ductal dilatation. Pancreas: A new hypovascular mass is seen in the pancreatic tail which measures 5.4 x 2.5 cm on image 38/4, consistent with pancreatic adenocarcinoma. Spleen:  Within normal limits in size and appearance. Adrenals/Urinary tract: Normal  appearance adrenal glands and right kidney. Small cyst again seen in upper pole of left kidney. A new 2 cm peripheral low-attenuation lesion is seen in the lower pole the left kidney which has ill-defined margins. This could be due to pyelonephritis, infarct, or neoplasm. No evidence of hydronephrosis. Mild diffuse bladder wall thickening is seen, likely due to chronic bladder outlet obstruction. Stomach/Bowel: Small hiatal hernia noted. No evidence of obstruction, inflammatory process, or abnormal fluid collections. Diverticulosis is seen, without evidence of diverticulitis. Vascular/Lymphatic: No pathologically enlarged lymph nodes identified. No abdominal aortic aneurysm. Aortic atherosclerosis. Reproductive: Mildly enlarged prostate gland with indentation of bladder base. Other:  None. Musculoskeletal:  No suspicious bone lesions identified. IMPRESSION: 5.4 cm mass in pancreatic tail, consistent with pancreatic adenocarcinoma. Diffuse liver metastases. No evidence of metastatic disease within chest or pelvis. New 2 cm ill-defined low-attenuation lesion in lower pole of left kidney, which is indeterminate. Differential diagnosis includes pyelonephritis, infarct, and neoplasm. Suggest correlation with urinalysis and continued attention on follow-up CT. 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 Acute bilateral pulmonary embolism, with CT evidence of right heart strain (RV/LV Ratio = 0.98) consistent with at least submassive (intermediate risk) PE. The presence of right heart strain has been associated with an increased risk of morbidity and mortality. Please activate Code PE by paging [phone]. Critical Value/emergent results were called by telephone at the time of interpretation on 09/29/2017 at 9:54 am to Dr. Lew Dawes , who verbally acknowledged these results. Electronically Signed   By: Earle Gell M.D.   On: 09/29/2017 09:58   Ct Chest W Contrast  Result Date: 09/29/2017 CLINICAL DATA:   Right upper quadrant abdominal pain, weight loss, and anorexia. Hepatic masses on recent ultrasound. EXAM: CT  CHEST WITH CONTRAST CT ABDOMEN AND PELVIS WITH AND WITHOUT CONTRAST TECHNIQUE: Multidetector CT imaging of the chest was performed during intravenous contrast administration. Multidetector CT imaging of the abdomen and pelvis was performed following the standard protocol before and during bolus administration of intravenous contrast. CONTRAST:  120mL ISOVUE-300 IOPAMIDOL (ISOVUE-300) INJECTION 61% COMPARISON:  Chest CT on 10/04/2016 and AP CT on 04/05/2016 FINDINGS: CT CHEST FINDINGS Cardiovascular: Pulmonary embolism is seen in both the right and left pulmonary arteries and old bilateral lobar branches. No saddle embolus seen. RV/LV ratio is 0.98, consistent with right heart strain. Thrombosis of the right subclavian vein is seen with adjacent soft tissue edema. Mediastinum/Lymph Nodes: No masses or pathologically enlarged lymph nodes identified. Lungs/Pleura: 7 mm pulmonary nodule in the posterior right lower lobe and 5 mm pulmonary nodule in the superior left lower lobe are stable since previous studies, consistent with benign etiology. No suspicious pulmonary nodules or masses identified. No evidence of pulmonary infiltrate or pleural effusion. Musculoskeletal:  No suspicious bone lesions identified. CT ABDOMEN AND PELVIS FINDINGS Hepatobiliary: Numerous rim enhancing hypovascular masses are seen throughout the right and left hepatic lobes which are new since previous study, consistent with diffuse liver metastases. Largest mass in the medial segment left lobe measures 7.1 x 5.5 cm on image 19/4. Gallbladder is unremarkable. No evidence of biliary ductal dilatation. Pancreas: A new hypovascular mass is seen in the pancreatic tail which measures 5.4 x 2.5 cm on image 38/4, consistent with pancreatic adenocarcinoma. Spleen:  Within normal limits in size and appearance. Adrenals/Urinary tract: Normal  appearance adrenal glands and right kidney. Small cyst again seen in upper pole of left kidney. A new 2 cm peripheral low-attenuation lesion is seen in the lower pole the left kidney which has ill-defined margins. This could be due to pyelonephritis, infarct, or neoplasm. No evidence of hydronephrosis. Mild diffuse bladder wall thickening is seen, likely due to chronic bladder outlet obstruction. Stomach/Bowel: Small hiatal hernia noted. No evidence of obstruction, inflammatory process, or abnormal fluid collections. Diverticulosis is seen, without evidence of diverticulitis. Vascular/Lymphatic: No pathologically enlarged lymph nodes identified. No abdominal aortic aneurysm. Aortic atherosclerosis. Reproductive: Mildly enlarged prostate gland with indentation of bladder base. Other:  None. Musculoskeletal:  No suspicious bone lesions identified. IMPRESSION: 5.4 cm mass in pancreatic tail, consistent with pancreatic adenocarcinoma. Diffuse liver metastases. No evidence of metastatic disease within chest or pelvis. New 2 cm ill-defined low-attenuation lesion in lower pole of left kidney, which is indeterminate. Differential diagnosis includes pyelonephritis, infarct, and neoplasm. Suggest correlation with urinalysis and continued attention on follow-up CT. 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 Acute bilateral pulmonary embolism, with CT evidence of right heart strain (RV/LV Ratio = 0.98) consistent with at least submassive (intermediate risk) PE. The presence of right heart strain has been associated with an increased risk of morbidity and mortality. Please activate Code PE by paging [phone]. Critical Value/emergent results were called by telephone at the time of interpretation on 09/29/2017 at 9:54 am to Dr. Lew Dawes , who verbally acknowledged these results. Electronically Signed   By: Earle Gell M.D.   On: 09/29/2017 09:58   US Abdomen Complete  Result Date: 09/20/2017 CLINICAL DATA:   Generalized abdominal pain EXAM: ABDOMEN ULTRASOUND COMPLETE COMPARISON:  04/05/2016 CT of the abdomen, 10/04/2016 CT of the chest FINDINGS: Gallbladder: Gallbladder is well distended with gallbladder sludge. No definitive stones are seen. No wall thickening is noted. Common bile duct: Diameter: 8 mm although within normal limits for the  patient's given age. Liver: Multiple hypoechoic masses are noted throughout the liver. The largest of these measures 9 cm in greatest dimension. These are consistent with metastatic disease. Portal vein is patent on color Doppler imaging with normal direction of blood flow towards the liver. IVC: No abnormality visualized. Pancreas: Visualized portion unremarkable. Spleen: Size and appearance within normal limits. Right Kidney: Length: 9.8 cm. Echogenicity within normal limits. No mass or hydronephrosis visualized. Left Kidney: Length: 11 cm. Echogenicity within normal limits. No mass or hydronephrosis visualized. Abdominal aorta: No aneurysm visualized. Other findings: None. IMPRESSION: Changes consistent with diffuse hepatic metastatic disease. Further workup by means of CT of the abdomen and pelvis with contrast is recommended. Chest CT may be helpful as well for further evaluation. Tissue sampling is also indicated. These results will be called to the ordering clinician or representative by the Radiologist Assistant, and communication documented in the PACS or zVision Dashboard. Electronically Signed   By: Inez Catalina M.D.   On: 09/20/2017 09:25   Ir US Guide Vasc Access Right  Result Date: 10/05/2017 CLINICAL DATA:  Pancreatic carcinoma, metastatic. Needs durable venous access for chemotherapy regimen. EXAM: TUNNELED PORT CATHETER PLACEMENT WITH ULTRASOUND AND FLUOROSCOPIC GUIDANCE FLUOROSCOPY TIME:  0.1 minute; 9.9 uGym2 DAP ANESTHESIA/SEDATION: Intravenous Fentanyl and Versed were administered as conscious sedation during continuous monitoring of the patient's level of  consciousness and physiological / cardiorespiratory status by the radiology RN, with a total moderate sedation time of 17 minutes. TECHNIQUE: The procedure, risks, benefits, and alternatives were explained to the patient. Questions regarding the procedure were encouraged and answered. The patient understands and consents to the procedure. As antibiotic prophylaxis, cefazolin 2 g was ordered pre-procedure and administered intravenously within one hour of incision. Patency of the right IJ vein was confirmed with ultrasound with image documentation. An appropriate skin site was determined. Skin site was marked. Region was prepped using maximum barrier technique including cap and mask, sterile gown, sterile gloves, large sterile sheet, and Chlorhexidine as cutaneous antisepsis. The region was infiltrated locally with 1% lidocaine. Under real-time ultrasound guidance, the right IJ vein was accessed with a 21 gauge micropuncture needle; the needle tip within the vein was confirmed with ultrasound image documentation. Needle was exchanged over a 018 guidewire for transitional dilator which allowed passage of the [facility] wire into the IVC. Over this, the transitional dilator was exchanged for a 5 [place] MPA catheter. A small incision was made on the right anterior chest wall and a subcutaneous pocket fashioned. The power-injectable port was positioned and its catheter tunneled to the right IJ dermatotomy site. The MPA catheter was exchanged over an Amplatz wire for a peel-away sheath, through which the port catheter, which had been trimmed to the appropriate length, was advanced and positioned under fluoroscopy with its tip at the cavoatrial junction. Spot chest radiograph confirms good catheter position and no pneumothorax. The pocket was closed with deep interrupted and subcuticular continuous 3-0 Monocryl sutures. The port was flushed per protocol. The incisions were covered with Dermabond then covered with a sterile  dressing. COMPLICATIONS: COMPLICATIONS None immediate IMPRESSION: Technically successful right IJ power-injectable port catheter placement. Ready for routine use. Electronically Signed   By: Lucrezia Europe M.D.   On: 10/05/2017 08:22   Ir US Guide Bx Asp/drain  Result Date: 10/05/2017 CLINICAL DATA:  Pancreatic tumor with multiple liver lesions EXAM: ULTRASOUND-GUIDED CORE LIVER BIOPSY TECHNIQUE: An ultrasound guided liver biopsy was thoroughly discussed with the patient and questions were answered. The benefits, risks,  alternatives, and complications were also discussed. The patient understands and wishes to proceed with the procedure. A verbal as well as written consent was obtained. Survey ultrasound of the liver was performed, representative lesion localized, and an appropriate skin entry site was determined. Skin site was marked, prepped with chlorhexidine, and draped in usual sterile fashion, and infiltrated locally with 1% lidocaine. Intravenous Fentanyl and Versed were administered as conscious sedation during continuous monitoring of the patient's level of consciousness and physiological / cardiorespiratory status by the radiology RN, with a total moderate sedation time of 10 minutes. A 17 gauge trocar needle was advanced under ultrasound guidance into the liver to the margin of the lesion. 3 solid-appearing coaxial 18gauge core samples were then obtained through the guide needle. The guide needle was removed. Post procedure scans demonstrate no apparent complication. COMPLICATIONS: COMPLICATIONS None immediate FINDINGS: Multiple hypoechoic solid liver lesions. Representative core biopsy samples obtained as above. IMPRESSION: 1. Technically successful ultrasound guided core liver lesion biopsy. Electronically Signed   By: Lucrezia Europe M.D.   On: 10/05/2017 08:24   Ir Fluoro Guide Port Insertion Right  Result Date: 10/05/2017 CLINICAL DATA:  Pancreatic carcinoma, metastatic. Needs durable venous access for  chemotherapy regimen. EXAM: TUNNELED PORT CATHETER PLACEMENT WITH ULTRASOUND AND FLUOROSCOPIC GUIDANCE FLUOROSCOPY TIME:  0.1 minute; 9.9 uGym2 DAP ANESTHESIA/SEDATION: Intravenous Fentanyl and Versed were administered as conscious sedation during continuous monitoring of the patient's level of consciousness and physiological / cardiorespiratory status by the radiology RN, with a total moderate sedation time of 17 minutes. TECHNIQUE: The procedure, risks, benefits, and alternatives were explained to the patient. Questions regarding the procedure were encouraged and answered. The patient understands and consents to the procedure. As antibiotic prophylaxis, cefazolin 2 g was ordered pre-procedure and administered intravenously within one hour of incision. Patency of the right IJ vein was confirmed with ultrasound with image documentation. An appropriate skin site was determined. Skin site was marked. Region was prepped using maximum barrier technique including cap and mask, sterile gown, sterile gloves, large sterile sheet, and Chlorhexidine as cutaneous antisepsis. The region was infiltrated locally with 1% lidocaine. Under real-time ultrasound guidance, the right IJ vein was accessed with a 21 gauge micropuncture needle; the needle tip within the vein was confirmed with ultrasound image documentation. Needle was exchanged over a 018 guidewire for transitional dilator which allowed passage of the [facility] wire into the IVC. Over this, the transitional dilator was exchanged for a 5 [place] MPA catheter. A small incision was made on the right anterior chest wall and a subcutaneous pocket fashioned. The power-injectable port was positioned and its catheter tunneled to the right IJ dermatotomy site. The MPA catheter was exchanged over an Amplatz wire for a peel-away sheath, through which the port catheter, which had been trimmed to the appropriate length, was advanced and positioned under fluoroscopy with its tip at the  cavoatrial junction. Spot chest radiograph confirms good catheter position and no pneumothorax. The pocket was closed with deep interrupted and subcuticular continuous 3-0 Monocryl sutures. The port was flushed per protocol. The incisions were covered with Dermabond then covered with a sterile dressing. COMPLICATIONS: COMPLICATIONS None immediate IMPRESSION: Technically successful right IJ power-injectable port catheter placement. Ready for routine use. Electronically Signed   By: Lucrezia Europe M.D.   On: 10/05/2017 08:22      Discharge Exam: BP (!) 163/75 (BP Location: Right Arm)   Pulse (!) 58   Temp 98.3 F (36.8 C) (Oral)   Resp 15  Ht 5\' 6"  (1.676 m)   Wt 72.7 kg (160 lb 4.4 oz)   SpO2 94%   BMI 25.87 kg/m   General: Elderly male, lying in bedside chair, no distress ENT: Oral Mucosa clear and moist Cardiovascular: regular rate and rhythm, no murmurs, rubs or gallops, Peripheral Pulses Present, no edema, no JVD Respiratory: Normal respiratory effort on room air, lungs clear to auscultation bilaterally with occasional crackles at bases Abdomen: soft, distended, non-tender, normal bowel sounds Skin: No Rash Musculoskeletal:Good ROM, no contractures. Normal muscle tone Neurologic: Grossly no focal neuro deficit.Mental status AAOx3, speech normal, Psychiatric:Appropriate affect, and mood   Discharge Instructions You were cared for by a hospitalist during your hospital stay. If you have any questions about your discharge medications or the care you received while you were in the hospital after you are discharged, you can call the unit and asked to speak with the hospitalist on call if the hospitalist that took care of you is not available. Once you are discharged, your primary care physician will handle any further medical issues. Please note that NO REFILLS for any discharge medications will be authorized once you are discharged, as it is imperative that you return to your primary care  physician (or establish a relationship with a primary care physician if you do not have one) for your aftercare needs so that they can reassess your need for medications and monitor your lab values.  Discharge Instructions    Diet - low sodium heart healthy   Complete by:  As directed    Increase activity slowly   Complete by:  As directed      Allergies as of 10/05/2017      Reactions   Cardura [doxazosin Mesylate] Other (See Comments)   dizziness      Medication List    STOP taking these medications   clopidogrel 75 MG tablet Commonly known as:  PLAVIX     TAKE these medications   apixaban 5 MG Tabs tablet Commonly known as:  ELIQUIS Take 2 tablets (10 mg total) by mouth 2 (two) times daily for 7 days.   apixaban 5 MG Tabs tablet Commonly known as:  ELIQUIS Take 1 tablet (5 mg total) by mouth 2 (two) times daily. Starting on 10/12/17 Start taking on:  10/12/2017   atenolol 50 MG tablet Commonly known as:  TENORMIN Take 1 tablet (50 mg total) by mouth 2 (two) times daily.   benzonatate 200 MG capsule Commonly known as:  TESSALON Take 1 capsule (200 mg total) by mouth 3 (three) times daily as needed for cough.   doxazosin 1 MG tablet Commonly known as:  CARDURA Take 1 tablet (1 mg total) by mouth at bedtime.   dronabinol 2.5 MG capsule Commonly known as:  MARINOL Take 1 capsule (2.5 mg total) by mouth 2 (two) times daily before lunch and supper.   escitalopram 20 MG tablet Commonly known as:  LEXAPRO Take 1 tablet (20 mg total) by mouth daily.   finasteride 5 MG tablet Commonly known as:  PROSCAR Take 1 tablet (5 mg total) by mouth daily.   HYDROcodone-acetaminophen 5-325 MG tablet Commonly known as:  NORCO/VICODIN Take 1-2 tablets by mouth every 4 (four) hours as needed for moderate pain.   losartan 100 MG tablet Commonly known as:  COZAAR Take 1 tablet (100 mg total) by mouth daily.   ondansetron 4 MG tablet Commonly known as:  ZOFRAN Take 1 tablet (4 mg  total) by mouth every 6 (six)  hours as needed for nausea.   pantoprazole 40 MG tablet Commonly known as:  PROTONIX Take 1 tablet (40 mg total) by mouth daily.   pravastatin 40 MG tablet Commonly known as:  PRAVACHOL Take 1 tablet (40 mg total) by mouth daily.   tamsulosin 0.4 MG Caps capsule Commonly known as:  FLOMAX Take 1 capsule (0.4 mg total) by mouth daily.   traMADol 50 MG tablet Commonly known as:  ULTRAM TAKE 1/2-1 TABLET BY MOUTH EVERY 12 HOURS AS NEEDED FOR SEVERE PAIN   triamcinolone ointment 0.1 % Commonly known as:  KENALOG Apply 1 application topically 2 (two) times daily.   Vitamin B-12 1000 MCG Subl Place 1 tablet (1,000 mcg total) under the tongue daily.      Allergies  Allergen Reactions  . Cardura [Doxazosin Mesylate] Other (See Comments)    dizziness      The results of significant diagnostics from this hospitalization (including imaging, microbiology, ancillary and laboratory) are listed below for reference.    Significant Diagnostic Studies: Ct Abdomen Pelvis W Wo Contrast  Result Date: 09/29/2017 CLINICAL DATA:  Right upper quadrant abdominal pain, weight loss, and anorexia. Hepatic masses on recent ultrasound. EXAM: CT CHEST WITH CONTRAST CT ABDOMEN AND PELVIS WITH AND WITHOUT CONTRAST TECHNIQUE: Multidetector CT imaging of the chest was performed during intravenous contrast administration. Multidetector CT imaging of the abdomen and pelvis was performed following the standard protocol before and during bolus administration of intravenous contrast. CONTRAST:  120mL ISOVUE-300 IOPAMIDOL (ISOVUE-300) INJECTION 61% COMPARISON:  Chest CT on 10/04/2016 and AP CT on 04/05/2016 FINDINGS: CT CHEST FINDINGS Cardiovascular: Pulmonary embolism is seen in both the right and left pulmonary arteries and old bilateral lobar branches. No saddle embolus seen. RV/LV ratio is 0.98, consistent with right heart strain. Thrombosis of the right subclavian vein is seen with  adjacent soft tissue edema. Mediastinum/Lymph Nodes: No masses or pathologically enlarged lymph nodes identified. Lungs/Pleura: 7 mm pulmonary nodule in the posterior right lower lobe and 5 mm pulmonary nodule in the superior left lower lobe are stable since previous studies, consistent with benign etiology. No suspicious pulmonary nodules or masses identified. No evidence of pulmonary infiltrate or pleural effusion. Musculoskeletal:  No suspicious bone lesions identified. CT ABDOMEN AND PELVIS FINDINGS Hepatobiliary: Numerous rim enhancing hypovascular masses are seen throughout the right and left hepatic lobes which are new since previous study, consistent with diffuse liver metastases. Largest mass in the medial segment left lobe measures 7.1 x 5.5 cm on image 19/4. Gallbladder is unremarkable. No evidence of biliary ductal dilatation. Pancreas: A new hypovascular mass is seen in the pancreatic tail which measures 5.4 x 2.5 cm on image 38/4, consistent with pancreatic adenocarcinoma. Spleen:  Within normal limits in size and appearance. Adrenals/Urinary tract: Normal appearance adrenal glands and right kidney. Small cyst again seen in upper pole of left kidney. A new 2 cm peripheral low-attenuation lesion is seen in the lower pole the left kidney which has ill-defined margins. This could be due to pyelonephritis, infarct, or neoplasm. No evidence of hydronephrosis. Mild diffuse bladder wall thickening is seen, likely due to chronic bladder outlet obstruction. Stomach/Bowel: Small hiatal hernia noted. No evidence of obstruction, inflammatory process, or abnormal fluid collections. Diverticulosis is seen, without evidence of diverticulitis. Vascular/Lymphatic: No pathologically enlarged lymph nodes identified. No abdominal aortic aneurysm. Aortic atherosclerosis. Reproductive: Mildly enlarged prostate gland with indentation of bladder base. Other:  None. Musculoskeletal:  No suspicious bone lesions identified.  IMPRESSION: 5.4 cm mass in pancreatic  tail, consistent with pancreatic adenocarcinoma. Diffuse liver metastases. No evidence of metastatic disease within chest or pelvis. New 2 cm ill-defined low-attenuation lesion in lower pole of left kidney, which is indeterminate. Differential diagnosis includes pyelonephritis, infarct, and neoplasm. Suggest correlation with urinalysis and continued attention on follow-up CT. 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 Acute bilateral pulmonary embolism, with CT evidence of right heart strain (RV/LV Ratio = 0.98) consistent with at least submassive (intermediate risk) PE. The presence of right heart strain has been associated with an increased risk of morbidity and mortality. Please activate Code PE by paging [phone]. Critical Value/emergent results were called by telephone at the time of interpretation on 09/29/2017 at 9:54 am to Dr. Lew Dawes , who verbally acknowledged these results. Electronically Signed   By: Earle Gell M.D.   On: 09/29/2017 09:58   Ct Chest W Contrast  Result Date: 09/29/2017 CLINICAL DATA:  Right upper quadrant abdominal pain, weight loss, and anorexia. Hepatic masses on recent ultrasound. EXAM: CT CHEST WITH CONTRAST CT ABDOMEN AND PELVIS WITH AND WITHOUT CONTRAST TECHNIQUE: Multidetector CT imaging of the chest was performed during intravenous contrast administration. Multidetector CT imaging of the abdomen and pelvis was performed following the standard protocol before and during bolus administration of intravenous contrast. CONTRAST:  132mL ISOVUE-300 IOPAMIDOL (ISOVUE-300) INJECTION 61% COMPARISON:  Chest CT on 10/04/2016 and AP CT on 04/05/2016 FINDINGS: CT CHEST FINDINGS Cardiovascular: Pulmonary embolism is seen in both the right and left pulmonary arteries and old bilateral lobar branches. No saddle embolus seen. RV/LV ratio is 0.98, consistent with right heart strain. Thrombosis of the right subclavian vein is seen with  adjacent soft tissue edema. Mediastinum/Lymph Nodes: No masses or pathologically enlarged lymph nodes identified. Lungs/Pleura: 7 mm pulmonary nodule in the posterior right lower lobe and 5 mm pulmonary nodule in the superior left lower lobe are stable since previous studies, consistent with benign etiology. No suspicious pulmonary nodules or masses identified. No evidence of pulmonary infiltrate or pleural effusion. Musculoskeletal:  No suspicious bone lesions identified. CT ABDOMEN AND PELVIS FINDINGS Hepatobiliary: Numerous rim enhancing hypovascular masses are seen throughout the right and left hepatic lobes which are new since previous study, consistent with diffuse liver metastases. Largest mass in the medial segment left lobe measures 7.1 x 5.5 cm on image 19/4. Gallbladder is unremarkable. No evidence of biliary ductal dilatation. Pancreas: A new hypovascular mass is seen in the pancreatic tail which measures 5.4 x 2.5 cm on image 38/4, consistent with pancreatic adenocarcinoma. Spleen:  Within normal limits in size and appearance. Adrenals/Urinary tract: Normal appearance adrenal glands and right kidney. Small cyst again seen in upper pole of left kidney. A new 2 cm peripheral low-attenuation lesion is seen in the lower pole the left kidney which has ill-defined margins. This could be due to pyelonephritis, infarct, or neoplasm. No evidence of hydronephrosis. Mild diffuse bladder wall thickening is seen, likely due to chronic bladder outlet obstruction. Stomach/Bowel: Small hiatal hernia noted. No evidence of obstruction, inflammatory process, or abnormal fluid collections. Diverticulosis is seen, without evidence of diverticulitis. Vascular/Lymphatic: No pathologically enlarged lymph nodes identified. No abdominal aortic aneurysm. Aortic atherosclerosis. Reproductive: Mildly enlarged prostate gland with indentation of bladder base. Other:  None. Musculoskeletal:  No suspicious bone lesions identified.  IMPRESSION: 5.4 cm mass in pancreatic tail, consistent with pancreatic adenocarcinoma. Diffuse liver metastases. No evidence of metastatic disease within chest or pelvis. New 2 cm ill-defined low-attenuation lesion in lower pole of left kidney, which is indeterminate. Differential  diagnosis includes pyelonephritis, infarct, and neoplasm. Suggest correlation with urinalysis and continued attention on follow-up CT. 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 Acute bilateral pulmonary embolism, with CT evidence of right heart strain (RV/LV Ratio = 0.98) consistent with at least submassive (intermediate risk) PE. The presence of right heart strain has been associated with an increased risk of morbidity and mortality. Please activate Code PE by paging [phone]. Critical Value/emergent results were called by telephone at the time of interpretation on 09/29/2017 at 9:54 am to Dr. Lew Dawes , who verbally acknowledged these results. Electronically Signed   By: Earle Gell M.D.   On: 09/29/2017 09:58   US Abdomen Complete  Result Date: 09/20/2017 CLINICAL DATA:  Generalized abdominal pain EXAM: ABDOMEN ULTRASOUND COMPLETE COMPARISON:  04/05/2016 CT of the abdomen, 10/04/2016 CT of the chest FINDINGS: Gallbladder: Gallbladder is well distended with gallbladder sludge. No definitive stones are seen. No wall thickening is noted. Common bile duct: Diameter: 8 mm although within normal limits for the patient's given age. Liver: Multiple hypoechoic masses are noted throughout the liver. The largest of these measures 9 cm in greatest dimension. These are consistent with metastatic disease. Portal vein is patent on color Doppler imaging with normal direction of blood flow towards the liver. IVC: No abnormality visualized. Pancreas: Visualized portion unremarkable. Spleen: Size and appearance within normal limits. Right Kidney: Length: 9.8 cm. Echogenicity within normal limits. No mass or hydronephrosis visualized.  Left Kidney: Length: 11 cm. Echogenicity within normal limits. No mass or hydronephrosis visualized. Abdominal aorta: No aneurysm visualized. Other findings: None. IMPRESSION: Changes consistent with diffuse hepatic metastatic disease. Further workup by means of CT of the abdomen and pelvis with contrast is recommended. Chest CT may be helpful as well for further evaluation. Tissue sampling is also indicated. These results will be called to the ordering clinician or representative by the Radiologist Assistant, and communication documented in the PACS or zVision Dashboard. Electronically Signed   By: Inez Catalina M.D.   On: 09/20/2017 09:25   Ir US Guide Vasc Access Right  Result Date: 10/05/2017 CLINICAL DATA:  Pancreatic carcinoma, metastatic. Needs durable venous access for chemotherapy regimen. EXAM: TUNNELED PORT CATHETER PLACEMENT WITH ULTRASOUND AND FLUOROSCOPIC GUIDANCE FLUOROSCOPY TIME:  0.1 minute; 9.9 uGym2 DAP ANESTHESIA/SEDATION: Intravenous Fentanyl and Versed were administered as conscious sedation during continuous monitoring of the patient's level of consciousness and physiological / cardiorespiratory status by the radiology RN, with a total moderate sedation time of 17 minutes. TECHNIQUE: The procedure, risks, benefits, and alternatives were explained to the patient. Questions regarding the procedure were encouraged and answered. The patient understands and consents to the procedure. As antibiotic prophylaxis, cefazolin 2 g was ordered pre-procedure and administered intravenously within one hour of incision. Patency of the right IJ vein was confirmed with ultrasound with image documentation. An appropriate skin site was determined. Skin site was marked. Region was prepped using maximum barrier technique including cap and mask, sterile gown, sterile gloves, large sterile sheet, and Chlorhexidine as cutaneous antisepsis. The region was infiltrated locally with 1% lidocaine. Under real-time  ultrasound guidance, the right IJ vein was accessed with a 21 gauge micropuncture needle; the needle tip within the vein was confirmed with ultrasound image documentation. Needle was exchanged over a 018 guidewire for transitional dilator which allowed passage of the [facility] wire into the IVC. Over this, the transitional dilator was exchanged for a 5 [place] MPA catheter. A small incision was made on the right anterior chest wall and a  subcutaneous pocket fashioned. The power-injectable port was positioned and its catheter tunneled to the right IJ dermatotomy site. The MPA catheter was exchanged over an Amplatz wire for a peel-away sheath, through which the port catheter, which had been trimmed to the appropriate length, was advanced and positioned under fluoroscopy with its tip at the cavoatrial junction. Spot chest radiograph confirms good catheter position and no pneumothorax. The pocket was closed with deep interrupted and subcuticular continuous 3-0 Monocryl sutures. The port was flushed per protocol. The incisions were covered with Dermabond then covered with a sterile dressing. COMPLICATIONS: COMPLICATIONS None immediate IMPRESSION: Technically successful right IJ power-injectable port catheter placement. Ready for routine use. Electronically Signed   By: Lucrezia Europe M.D.   On: 10/05/2017 08:22   Ir US Guide Bx Asp/drain  Result Date: 10/05/2017 CLINICAL DATA:  Pancreatic tumor with multiple liver lesions EXAM: ULTRASOUND-GUIDED CORE LIVER BIOPSY TECHNIQUE: An ultrasound guided liver biopsy was thoroughly discussed with the patient and questions were answered. The benefits, risks, alternatives, and complications were also discussed. The patient understands and wishes to proceed with the procedure. A verbal as well as written consent was obtained. Survey ultrasound of the liver was performed, representative lesion localized, and an appropriate skin entry site was determined. Skin site was marked, prepped with  chlorhexidine, and draped in usual sterile fashion, and infiltrated locally with 1% lidocaine. Intravenous Fentanyl and Versed were administered as conscious sedation during continuous monitoring of the patient's level of consciousness and physiological / cardiorespiratory status by the radiology RN, with a total moderate sedation time of 10 minutes. A 17 gauge trocar needle was advanced under ultrasound guidance into the liver to the margin of the lesion. 3 solid-appearing coaxial 18gauge core samples were then obtained through the guide needle. The guide needle was removed. Post procedure scans demonstrate no apparent complication. COMPLICATIONS: COMPLICATIONS None immediate FINDINGS: Multiple hypoechoic solid liver lesions. Representative core biopsy samples obtained as above. IMPRESSION: 1. Technically successful ultrasound guided core liver lesion biopsy. Electronically Signed   By: Lucrezia Europe M.D.   On: 10/05/2017 08:24   Ir Fluoro Guide Port Insertion Right  Result Date: 10/05/2017 CLINICAL DATA:  Pancreatic carcinoma, metastatic. Needs durable venous access for chemotherapy regimen. EXAM: TUNNELED PORT CATHETER PLACEMENT WITH ULTRASOUND AND FLUOROSCOPIC GUIDANCE FLUOROSCOPY TIME:  0.1 minute; 9.9 uGym2 DAP ANESTHESIA/SEDATION: Intravenous Fentanyl and Versed were administered as conscious sedation during continuous monitoring of the patient's level of consciousness and physiological / cardiorespiratory status by the radiology RN, with a total moderate sedation time of 17 minutes. TECHNIQUE: The procedure, risks, benefits, and alternatives were explained to the patient. Questions regarding the procedure were encouraged and answered. The patient understands and consents to the procedure. As antibiotic prophylaxis, cefazolin 2 g was ordered pre-procedure and administered intravenously within one hour of incision. Patency of the right IJ vein was confirmed with ultrasound with image documentation. An  appropriate skin site was determined. Skin site was marked. Region was prepped using maximum barrier technique including cap and mask, sterile gown, sterile gloves, large sterile sheet, and Chlorhexidine as cutaneous antisepsis. The region was infiltrated locally with 1% lidocaine. Under real-time ultrasound guidance, the right IJ vein was accessed with a 21 gauge micropuncture needle; the needle tip within the vein was confirmed with ultrasound image documentation. Needle was exchanged over a 018 guidewire for transitional dilator which allowed passage of the [facility] wire into the IVC. Over this, the transitional dilator was exchanged for a 5 [place] MPA catheter. A small  incision was made on the right anterior chest wall and a subcutaneous pocket fashioned. The power-injectable port was positioned and its catheter tunneled to the right IJ dermatotomy site. The MPA catheter was exchanged over an Amplatz wire for a peel-away sheath, through which the port catheter, which had been trimmed to the appropriate length, was advanced and positioned under fluoroscopy with its tip at the cavoatrial junction. Spot chest radiograph confirms good catheter position and no pneumothorax. The pocket was closed with deep interrupted and subcuticular continuous 3-0 Monocryl sutures. The port was flushed per protocol. The incisions were covered with Dermabond then covered with a sterile dressing. COMPLICATIONS: COMPLICATIONS None immediate IMPRESSION: Technically successful right IJ power-injectable port catheter placement. Ready for routine use. Electronically Signed   By: Lucrezia Europe M.D.   On: 10/05/2017 08:22    Microbiology: Recent Results (from the past 240 hour(s))  MRSA PCR Screening     Status: None   Collection Time: 09/29/17  4:33 PM  Result Value Ref Range Status   MRSA by PCR NEGATIVE NEGATIVE Final    Comment:        The GeneXpert MRSA Assay (FDA approved for NASAL specimens only), is one component of  a comprehensive MRSA colonization surveillance program. It is not intended to diagnose MRSA infection nor to guide or monitor treatment for MRSA infections. Performed at [facility], [place] [address] [place], [place] [address]      Labs: Basic Metabolic Panel: Recent Labs  Lab 09/30/17 0225 10/01/17 0301 10/03/17 0621 10/04/17 0330 10/05/17 0355  NA 135 136 135 138 137  K 4.0 3.8 3.6 3.7 3.2*  CL 101 101 105 106 103  CO2 24 23 21* 19* 22  GLUCOSE 131* 120* 125* 130* 107*  BUN 13 15 19 15 17   CREATININE 1.06 1.15 1.30* 1.16 1.05  CALCIUM 8.8* 8.7* 8.7* 8.7* 8.7*   Liver Function Tests: Recent Labs  Lab 09/30/17 0225 10/01/17 0301 10/03/17 0621 10/04/17 0330 10/05/17 0355  AST 42* 38 49* 50* 50*  ALT 33 31 41 44 41  ALKPHOS 311* 292* 305* 320* 316*  BILITOT 1.0 0.9 1.1 1.3* 1.0  PROT 6.0* 6.1* 6.1* 5.8* 5.5*  ALBUMIN 3.2* 3.0* 3.2* 2.9* 2.8*   No results for input(s): LIPASE, AMYLASE in the last 168 hours. No results for input(s): AMMONIA in the last 168 hours. CBC: Recent Labs  Lab 10/01/17 0301 10/02/17 0319 10/03/17 0204 10/04/17 0330 10/05/17 0355  WBC 13.0* 13.5* 12.0* 12.0* 11.0*  HGB 11.7* 11.3* 10.9* 10.8* 10.7*  HCT 36.8* 35.3* 34.0* 33.8* 33.0*  MCV 83.8 83.6 83.5 84.1 83.8  PLT 175 175 179 180 146*   Cardiac Enzymes: No results for input(s): CKTOTAL, CKMB, CKMBINDEX, TROPONINI in the last 168 hours. BNP: BNP (last 3 results) Recent Labs    09/29/17 1215  BNP 80.2    ProBNP (last 3 results) No results for input(s): PROBNP in the last 8760 hours.  CBG: No results for input(s): GLUCAP in the last 168 hours.     Signed:  Desiree Hane, MD [facility] 10/05/2017, 11:15 AM

## 2017-10-05 NOTE — Care Management Note (Signed)
    Durable Medical Equipment  (From admission, onward)        Start     Ordered   10/05/17 0000  DME Bedside commode    Question:  Patient needs a bedside commode to treat with the following condition  Answer:  Weakness   10/05/17 1155   10/05/17 [facility] bed    Question Answer Comment  Patient has (list medical condition): metastatic pancreatic cancer, history of CVA, SOB when flat   The above medical condition requires: Patient requires the ability to reposition frequently   Head must be elevated greater than: 30 degrees   Bed type Semi-electric      10/05/17 1156

## 2017-10-06 ENCOUNTER — Telehealth: Payer: Self-pay | Admitting: *Deleted

## 2017-10-06 LAB — CEA: CEA1: 35.1 ng/mL — AB (ref 0.0–4.7)

## 2017-10-06 NOTE — Telephone Encounter (Signed)
Transition Care Management Follow-up Telephone Call   Date discharged? 10/05/17   How have you been since you were released from the hospital? Pt states he's doing ok   Do you understand why you were in the hospital? YES   Do you understand the discharge instructions? YES   Where were you discharged to? Home   Items Reviewed:  Medications reviewed: YES  Allergies reviewed: YES  Dietary changes reviewed: NO  Referrals reviewed: No referral needed   Functional Questionnaire:   Activities of Daily Living (ADLs):   He states he are independent in the following: bathing and hygiene, feeding, continence, grooming, toileting and dressing States they require assistance with the following: ambulation sometimes   Any transportation issues/concerns?: NO   Any patient concerns? NO   Confirmed importance and date/time of follow-up visits scheduled YES, appt 10/10/17  Provider Appointment booked with Dr. Alain Marion   Confirmed with patient if condition begins to worsen call PCP or go to the ER.  Patient was given the office number and encouraged to call back with question or concerns.  : YES

## 2017-10-07 LAB — CANCER ANTIGEN 19-9: CA 19-9: 54662 U/mL — ABNORMAL HIGH (ref 0–35)

## 2017-10-08 ENCOUNTER — Inpatient Hospital Stay (HOSPITAL_COMMUNITY)
Admission: EM | Admit: 2017-10-08 | Discharge: 2017-10-10 | DRG: 065 | Disposition: A | Discharge status: Hospice | Payer: Medicare Other | Attending: Internal Medicine | Admitting: Internal Medicine

## 2017-10-08 ENCOUNTER — Encounter (HOSPITAL_COMMUNITY): Payer: Self-pay

## 2017-10-08 ENCOUNTER — Other Ambulatory Visit: Payer: Self-pay

## 2017-10-08 DIAGNOSIS — R4701 Aphasia: Secondary | ICD-10-CM | POA: Diagnosis not present

## 2017-10-08 DIAGNOSIS — N400 Enlarged prostate without lower urinary tract symptoms: Secondary | ICD-10-CM | POA: Diagnosis not present

## 2017-10-08 DIAGNOSIS — G8194 Hemiplegia, unspecified affecting left nondominant side: Secondary | ICD-10-CM | POA: Diagnosis present

## 2017-10-08 DIAGNOSIS — Z86711 Personal history of pulmonary embolism: Secondary | ICD-10-CM | POA: Diagnosis not present

## 2017-10-08 DIAGNOSIS — Z86718 Personal history of other venous thrombosis and embolism: Secondary | ICD-10-CM | POA: Diagnosis not present

## 2017-10-08 DIAGNOSIS — Z8673 Personal history of transient ischemic attack (TIA), and cerebral infarction without residual deficits: Secondary | ICD-10-CM

## 2017-10-08 DIAGNOSIS — Z8249 Family history of ischemic heart disease and other diseases of the circulatory system: Secondary | ICD-10-CM | POA: Diagnosis not present

## 2017-10-08 DIAGNOSIS — C252 Malignant neoplasm of tail of pancreas: Secondary | ICD-10-CM | POA: Diagnosis not present

## 2017-10-08 DIAGNOSIS — R627 Adult failure to thrive: Secondary | ICD-10-CM | POA: Diagnosis not present

## 2017-10-08 DIAGNOSIS — E785 Hyperlipidemia, unspecified: Secondary | ICD-10-CM | POA: Diagnosis present

## 2017-10-08 DIAGNOSIS — I10 Essential (primary) hypertension: Secondary | ICD-10-CM | POA: Diagnosis present

## 2017-10-08 DIAGNOSIS — C787 Secondary malignant neoplasm of liver and intrahepatic bile duct: Secondary | ICD-10-CM | POA: Diagnosis present

## 2017-10-08 DIAGNOSIS — K219 Gastro-esophageal reflux disease without esophagitis: Secondary | ICD-10-CM | POA: Diagnosis present

## 2017-10-08 DIAGNOSIS — R531 Weakness: Secondary | ICD-10-CM | POA: Diagnosis not present

## 2017-10-08 DIAGNOSIS — I63512 Cerebral infarction due to unspecified occlusion or stenosis of left middle cerebral artery: Secondary | ICD-10-CM | POA: Diagnosis not present

## 2017-10-08 DIAGNOSIS — G460 Middle cerebral artery syndrome: Secondary | ICD-10-CM | POA: Diagnosis not present

## 2017-10-08 DIAGNOSIS — R29810 Facial weakness: Secondary | ICD-10-CM | POA: Diagnosis present

## 2017-10-08 DIAGNOSIS — Z515 Encounter for palliative care: Secondary | ICD-10-CM

## 2017-10-08 DIAGNOSIS — Z66 Do not resuscitate: Secondary | ICD-10-CM | POA: Diagnosis not present

## 2017-10-08 DIAGNOSIS — Z7901 Long term (current) use of anticoagulants: Secondary | ICD-10-CM

## 2017-10-08 DIAGNOSIS — R471 Dysarthria and anarthria: Secondary | ICD-10-CM | POA: Diagnosis present

## 2017-10-08 DIAGNOSIS — C259 Malignant neoplasm of pancreas, unspecified: Secondary | ICD-10-CM | POA: Diagnosis not present

## 2017-10-08 DIAGNOSIS — F17290 Nicotine dependence, other tobacco product, uncomplicated: Secondary | ICD-10-CM | POA: Diagnosis present

## 2017-10-08 DIAGNOSIS — E538 Deficiency of other specified B group vitamins: Secondary | ICD-10-CM | POA: Diagnosis present

## 2017-10-08 DIAGNOSIS — I63511 Cerebral infarction due to unspecified occlusion or stenosis of right middle cerebral artery: Principal | ICD-10-CM | POA: Diagnosis present

## 2017-10-08 DIAGNOSIS — R404 Transient alteration of awareness: Secondary | ICD-10-CM | POA: Diagnosis not present

## 2017-10-08 DIAGNOSIS — I639 Cerebral infarction, unspecified: Secondary | ICD-10-CM

## 2017-10-08 DIAGNOSIS — J449 Chronic obstructive pulmonary disease, unspecified: Secondary | ICD-10-CM | POA: Diagnosis not present

## 2017-10-08 DIAGNOSIS — R29730 NIHSS score 30: Secondary | ICD-10-CM | POA: Diagnosis not present

## 2017-10-08 DIAGNOSIS — I2699 Other pulmonary embolism without acute cor pulmonale: Secondary | ICD-10-CM | POA: Diagnosis present

## 2017-10-08 LAB — CBG MONITORING, ED: Glucose-Capillary: 131 mg/dL — ABNORMAL HIGH (ref 65–99)

## 2017-10-08 LAB — BASIC METABOLIC PANEL
Anion gap: 12 (ref 5–15)
BUN: 19 mg/dL (ref 6–20)
CO2: 22 mmol/L (ref 22–32)
Calcium: 8.9 mg/dL (ref 8.9–10.3)
Chloride: 104 mmol/L (ref 101–111)
Creatinine, Ser: 1.03 mg/dL (ref 0.61–1.24)
GFR calc Af Amer: >60 mL/min (ref >60)
GFR calc non Af Amer: >60 mL/min (ref >60)
Glucose, Bld: 143 mg/dL — ABNORMAL HIGH (ref 65–99)
Potassium: 3.7 mmol/L (ref 3.5–5.1)
Sodium: 138 mmol/L (ref 135–145)

## 2017-10-08 LAB — CBC
HCT: 35.4 % — ABNORMAL LOW (ref 39.0–52.0)
Hemoglobin: 11.2 g/dL — ABNORMAL LOW (ref 13.0–17.0)
MCH: 27.1 pg (ref 26.0–34.0)
MCHC: 31.6 g/dL (ref 30.0–36.0)
MCV: 85.7 fL (ref 78.0–100.0)
Platelets: 110 10*3/uL — ABNORMAL LOW (ref 150–400)
RBC: 4.13 MIL/uL — ABNORMAL LOW (ref 4.22–5.81)
RDW: 16.5 % — ABNORMAL HIGH (ref 11.5–15.5)
WBC: 14.2 10*3/uL — ABNORMAL HIGH (ref 4.0–10.5)

## 2017-10-08 MED ORDER — SODIUM CHLORIDE 0.9 % IV BOLUS
1000.0000 mL | Freq: Once | INTRAVENOUS | Status: AC
  Administered 2017-10-08: 1000 mL via INTRAVENOUS

## 2017-10-08 MED ORDER — ONDANSETRON HCL 4 MG/2ML IJ SOLN
4.0000 mg | Freq: Once | INTRAMUSCULAR | Status: AC
  Administered 2017-10-08: 4 mg via INTRAVENOUS
  Filled 2017-10-08: qty 2

## 2017-10-08 NOTE — ED Notes (Signed)
No respiratory or acute distress noted resting in bed with eyes closed call light in reach. 

## 2017-10-08 NOTE — ED Notes (Signed)
Bed: WA21 Expected date:  Expected time:  Means of arrival:  Comments: EMS 84 yo male from home/weakness-hx pancreatic and liver cancer

## 2017-10-08 NOTE — ED Notes (Signed)
No respiratory or acute distress noted alert and oriented x 3 call light in reach water given.

## 2017-10-08 NOTE — ED Triage Notes (Signed)
States since yesterday weakness not eating or drinking pt has stage 4 pancreatic cancer and has moved to liver alert and oriented x 3. Blood sugar 141.

## 2017-10-09 ENCOUNTER — Emergency Department (HOSPITAL_COMMUNITY): Payer: Medicare Other

## 2017-10-09 ENCOUNTER — Encounter (HOSPITAL_COMMUNITY): Payer: Self-pay | Admitting: Radiology

## 2017-10-09 DIAGNOSIS — Z86711 Personal history of pulmonary embolism: Secondary | ICD-10-CM | POA: Diagnosis not present

## 2017-10-09 DIAGNOSIS — R471 Dysarthria and anarthria: Secondary | ICD-10-CM | POA: Diagnosis not present

## 2017-10-09 DIAGNOSIS — Z8673 Personal history of transient ischemic attack (TIA), and cerebral infarction without residual deficits: Secondary | ICD-10-CM | POA: Diagnosis not present

## 2017-10-09 DIAGNOSIS — F17290 Nicotine dependence, other tobacco product, uncomplicated: Secondary | ICD-10-CM | POA: Diagnosis present

## 2017-10-09 DIAGNOSIS — I63512 Cerebral infarction due to unspecified occlusion or stenosis of left middle cerebral artery: Secondary | ICD-10-CM

## 2017-10-09 DIAGNOSIS — E785 Hyperlipidemia, unspecified: Secondary | ICD-10-CM | POA: Diagnosis not present

## 2017-10-09 DIAGNOSIS — G8194 Hemiplegia, unspecified affecting left nondominant side: Secondary | ICD-10-CM | POA: Diagnosis not present

## 2017-10-09 DIAGNOSIS — R29810 Facial weakness: Secondary | ICD-10-CM | POA: Diagnosis not present

## 2017-10-09 DIAGNOSIS — N400 Enlarged prostate without lower urinary tract symptoms: Secondary | ICD-10-CM | POA: Diagnosis not present

## 2017-10-09 DIAGNOSIS — Z66 Do not resuscitate: Secondary | ICD-10-CM | POA: Diagnosis not present

## 2017-10-09 DIAGNOSIS — R29730 NIHSS score 30: Secondary | ICD-10-CM | POA: Diagnosis not present

## 2017-10-09 DIAGNOSIS — C259 Malignant neoplasm of pancreas, unspecified: Secondary | ICD-10-CM | POA: Diagnosis not present

## 2017-10-09 DIAGNOSIS — Z7401 Bed confinement status: Secondary | ICD-10-CM | POA: Diagnosis not present

## 2017-10-09 DIAGNOSIS — I10 Essential (primary) hypertension: Secondary | ICD-10-CM | POA: Diagnosis not present

## 2017-10-09 DIAGNOSIS — R627 Adult failure to thrive: Secondary | ICD-10-CM

## 2017-10-09 DIAGNOSIS — R4701 Aphasia: Secondary | ICD-10-CM | POA: Diagnosis not present

## 2017-10-09 DIAGNOSIS — Z515 Encounter for palliative care: Secondary | ICD-10-CM

## 2017-10-09 DIAGNOSIS — M2550 Pain in unspecified joint: Secondary | ICD-10-CM | POA: Diagnosis not present

## 2017-10-09 DIAGNOSIS — I63511 Cerebral infarction due to unspecified occlusion or stenosis of right middle cerebral artery: Secondary | ICD-10-CM | POA: Diagnosis not present

## 2017-10-09 DIAGNOSIS — K219 Gastro-esophageal reflux disease without esophagitis: Secondary | ICD-10-CM | POA: Diagnosis not present

## 2017-10-09 DIAGNOSIS — Z7901 Long term (current) use of anticoagulants: Secondary | ICD-10-CM | POA: Diagnosis not present

## 2017-10-09 DIAGNOSIS — C252 Malignant neoplasm of tail of pancreas: Secondary | ICD-10-CM | POA: Diagnosis not present

## 2017-10-09 DIAGNOSIS — R531 Weakness: Secondary | ICD-10-CM | POA: Diagnosis not present

## 2017-10-09 DIAGNOSIS — I639 Cerebral infarction, unspecified: Secondary | ICD-10-CM | POA: Diagnosis not present

## 2017-10-09 DIAGNOSIS — C787 Secondary malignant neoplasm of liver and intrahepatic bile duct: Secondary | ICD-10-CM

## 2017-10-09 DIAGNOSIS — G460 Middle cerebral artery syndrome: Secondary | ICD-10-CM | POA: Diagnosis not present

## 2017-10-09 DIAGNOSIS — Z86718 Personal history of other venous thrombosis and embolism: Secondary | ICD-10-CM | POA: Diagnosis not present

## 2017-10-09 DIAGNOSIS — E538 Deficiency of other specified B group vitamins: Secondary | ICD-10-CM | POA: Diagnosis not present

## 2017-10-09 DIAGNOSIS — Z8249 Family history of ischemic heart disease and other diseases of the circulatory system: Secondary | ICD-10-CM | POA: Diagnosis not present

## 2017-10-09 DIAGNOSIS — J449 Chronic obstructive pulmonary disease, unspecified: Secondary | ICD-10-CM | POA: Diagnosis not present

## 2017-10-09 LAB — URINALYSIS, ROUTINE W REFLEX MICROSCOPIC
Bacteria, UA: NONE SEEN
Bilirubin Urine: NEGATIVE
Glucose, UA: NEGATIVE mg/dL
Ketones, ur: 5 mg/dL — AB
Leukocytes, UA: NEGATIVE
Nitrite: NEGATIVE
Protein, ur: NEGATIVE mg/dL
Specific Gravity, Urine: 1.015 (ref 1.005–1.030)
pH: 6 (ref 5.0–8.0)

## 2017-10-09 LAB — HEPATIC FUNCTION PANEL
ALK PHOS: 417 U/L — AB (ref 38–126)
ALT: 49 U/L (ref 17–63)
AST: 65 U/L — AB (ref 15–41)
Albumin: 3.1 g/dL — ABNORMAL LOW (ref 3.5–5.0)
BILIRUBIN DIRECT: 0.4 mg/dL (ref 0.1–0.5)
BILIRUBIN INDIRECT: 0.9 mg/dL (ref 0.3–0.9)
Total Bilirubin: 1.3 mg/dL — ABNORMAL HIGH (ref 0.3–1.2)
Total Protein: 6.4 g/dL — ABNORMAL LOW (ref 6.5–8.1)

## 2017-10-09 LAB — PROTIME-INR
INR: 1.64
Prothrombin Time: 19.3 seconds — ABNORMAL HIGH (ref 11.4–15.2)

## 2017-10-09 LAB — APTT: APTT: 30 s (ref 24–36)

## 2017-10-09 LAB — LIPASE, BLOOD: Lipase: 24 U/L (ref 11–51)

## 2017-10-09 MED ORDER — IOPAMIDOL (ISOVUE-370) INJECTION 76%
100.0000 mL | Freq: Once | INTRAVENOUS | Status: AC | PRN
  Administered 2017-10-09: 100 mL via INTRAVENOUS

## 2017-10-09 MED ORDER — HALOPERIDOL LACTATE 2 MG/ML PO CONC
0.5000 mg | ORAL | Status: DC | PRN
  Filled 2017-10-09: qty 0.3

## 2017-10-09 MED ORDER — LORAZEPAM 2 MG/ML PO CONC: 1.0000 mg | ORAL | Status: DC | PRN

## 2017-10-09 MED ORDER — ATENOLOL 50 MG PO TABS
50.0000 mg | ORAL_TABLET | Freq: Two times a day (BID) | ORAL | Status: DC
  Filled 2017-10-09: qty 1

## 2017-10-09 MED ORDER — MORPHINE SULFATE (CONCENTRATE) 10 MG/0.5ML PO SOLN: 5.0000 mg | ORAL | Status: DC | PRN

## 2017-10-09 MED ORDER — GLYCOPYRROLATE 0.2 MG/ML IJ SOLN: 0.2000 mg | INTRAMUSCULAR | Status: DC | PRN

## 2017-10-09 MED ORDER — ONDANSETRON HCL 4 MG/2ML IJ SOLN: 4.0000 mg | Freq: Four times a day (QID) | INTRAMUSCULAR | Status: DC | PRN

## 2017-10-09 MED ORDER — LOSARTAN POTASSIUM 50 MG PO TABS
100.0000 mg | ORAL_TABLET | Freq: Every day | ORAL | Status: DC
  Filled 2017-10-09: qty 2

## 2017-10-09 MED ORDER — MORPHINE SULFATE (PF) 4 MG/ML IV SOLN
1.0000 mg | INTRAVENOUS | Status: DC | PRN
Start: 2017-10-09 — End: 2017-10-10
  Administered 2017-10-10 (×3): 2 mg via INTRAVENOUS
  Administered 2017-10-10: 1 mg via INTRAVENOUS
  Administered 2017-10-10: 2 mg via INTRAVENOUS
  Filled 2017-10-09 (×7): qty 1

## 2017-10-09 MED ORDER — ACETAMINOPHEN 325 MG PO TABS: 650.0000 mg | ORAL_TABLET | Freq: Four times a day (QID) | ORAL | Status: DC | PRN

## 2017-10-09 MED ORDER — HALOPERIDOL LACTATE 5 MG/ML IJ SOLN
0.5000 mg | INTRAMUSCULAR | Status: DC | PRN
  Filled 2017-10-09: qty 1

## 2017-10-09 MED ORDER — POLYVINYL ALCOHOL 1.4 % OP SOLN
1.0000 [drp] | Freq: Four times a day (QID) | OPHTHALMIC | Status: DC | PRN
  Filled 2017-10-09: qty 15

## 2017-10-09 MED ORDER — ESCITALOPRAM OXALATE 10 MG PO TABS: 20.0000 mg | ORAL_TABLET | Freq: Every day | ORAL | Status: DC

## 2017-10-09 MED ORDER — FINASTERIDE 5 MG PO TABS
5.0000 mg | ORAL_TABLET | Freq: Every day | ORAL | Status: DC
  Filled 2017-10-09: qty 1

## 2017-10-09 MED ORDER — IOPAMIDOL (ISOVUE-370) INJECTION 76%
INTRAVENOUS | Status: AC
  Filled 2017-10-09: qty 100

## 2017-10-09 MED ORDER — SCOPOLAMINE 1 MG/3DAYS TD PT72: 1.0000 | MEDICATED_PATCH | TRANSDERMAL | Status: DC

## 2017-10-09 MED ORDER — TRAZODONE HCL 50 MG PO TABS: 25.0000 mg | ORAL_TABLET | Freq: Every evening | ORAL | Status: DC | PRN

## 2017-10-09 MED ORDER — SODIUM CHLORIDE 0.9 % IV SOLN
Freq: Once | INTRAVENOUS | Status: AC
  Administered 2017-10-09: 09:00:00 via INTRAVENOUS

## 2017-10-09 MED ORDER — APIXABAN 5 MG PO TABS
10.0000 mg | ORAL_TABLET | Freq: Two times a day (BID) | ORAL | Status: DC
  Filled 2017-10-09: qty 2

## 2017-10-09 MED ORDER — GLYCOPYRROLATE 1 MG PO TABS
1.0000 mg | ORAL_TABLET | ORAL | Status: DC | PRN
  Filled 2017-10-09: qty 1

## 2017-10-09 MED ORDER — BIOTENE DRY MOUTH MT LIQD: 15.0000 mL | OROMUCOSAL | Status: DC | PRN

## 2017-10-09 MED ORDER — PRAVASTATIN SODIUM 40 MG PO TABS: 40.0000 mg | ORAL_TABLET | Freq: Every day | ORAL | Status: DC

## 2017-10-09 MED ORDER — ONDANSETRON 4 MG PO TBDP: 4.0000 mg | ORAL_TABLET | Freq: Four times a day (QID) | ORAL | Status: DC | PRN

## 2017-10-09 MED ORDER — LORAZEPAM 2 MG/ML IJ SOLN: 1.0000 mg | INTRAMUSCULAR | Status: DC | PRN

## 2017-10-09 MED ORDER — HALOPERIDOL 0.5 MG PO TABS
0.5000 mg | ORAL_TABLET | ORAL | Status: DC | PRN
  Filled 2017-10-09: qty 1

## 2017-10-09 MED ORDER — DIPHENHYDRAMINE HCL 50 MG/ML IJ SOLN: 12.5000 mg | INTRAMUSCULAR | Status: DC | PRN

## 2017-10-09 MED ORDER — GLYCOPYRROLATE 0.2 MG/ML IJ SOLN
0.2000 mg | INTRAMUSCULAR | Status: DC | PRN
  Administered 2017-10-10: 0.2 mg via INTRAVENOUS
  Filled 2017-10-09: qty 1

## 2017-10-09 MED ORDER — PANTOPRAZOLE SODIUM 40 MG PO TBEC: 40.0000 mg | DELAYED_RELEASE_TABLET | Freq: Every day | ORAL | Status: DC

## 2017-10-09 MED ORDER — SODIUM CHLORIDE 0.9 % IV SOLN
INTRAVENOUS | Status: DC
  Administered 2017-10-09 – 2017-10-10 (×2): via INTRAVENOUS

## 2017-10-09 MED ORDER — SODIUM CHLORIDE 0.9 % IV SOLN: INTRAVENOUS | Status: DC

## 2017-10-09 MED ORDER — LORAZEPAM 1 MG PO TABS: 1.0000 mg | ORAL_TABLET | ORAL | Status: DC | PRN

## 2017-10-09 MED ORDER — ACETAMINOPHEN 650 MG RE SUPP: 650.0000 mg | Freq: Four times a day (QID) | RECTAL | Status: DC | PRN

## 2017-10-09 MED ORDER — BENZONATATE 100 MG PO CAPS: 200.0000 mg | ORAL_CAPSULE | Freq: Three times a day (TID) | ORAL | Status: DC | PRN

## 2017-10-09 MED ORDER — HYDROCODONE-ACETAMINOPHEN 5-325 MG PO TABS: 1.0000 | ORAL_TABLET | ORAL | Status: DC | PRN

## 2017-10-09 MED ORDER — DRONABINOL 2.5 MG PO CAPS: 2.5000 mg | ORAL_CAPSULE | Freq: Two times a day (BID) | ORAL | Status: DC

## 2017-10-09 NOTE — ED Provider Notes (Signed)
Pt sent from [facility] ED with cva, for CT perfusion study and neurology consultation.  Pt awake and alert. Bp elevated. Protecting airway. Sent for emergent ct perfusion.  Neurology evaluated in ED and CT - says not candidate for tpa or other acute neurologic intervention - to admit to hospitalist service for cva.  Hospitalists consulted - will admit.      Lajean Saver, MD 10/09/17 1141

## 2017-10-09 NOTE — ED Notes (Signed)
No respiratory or acute distress noted resting in bed with eyes closed call light in reach no reaction to medication noted. 

## 2017-10-09 NOTE — Consult Note (Signed)
Consultation Note Date: 10/09/2017   Patient Name: Austin Torres  DOB: 1933/09/23  MRN: [id number]  Age / Sex: 84 y.o., male  PCP: Plotnikov, Evie Lacks, MD Referring Physician: Lajean Saver, MD  Reason for Consultation: Establishing goals of care  HPI/Patient Profile: 84 y.o. male  with past medical history of recently diagnosed stage IV pancreatic cancer (dx 09/2017) metastatic to liver, COPD, HTN, BPH, B12 deficiency, who was recently hospitalized 09/29/17 to 10/05/17 with DVT/PE and is now readmitted on 10/08/2017 with weakness and poor oral intake and was found to have an large acute R. MCA CVA on head CT. Patient is pending admission for comfort care with plan to discharge home with hospice once services can be arranged. Palliative care was asked to assist with symptom management and establishing goals.   Clinical Assessment and Goals of Care: Patient was seen in the ER. He is lethargic but wakes when stimulated and says a few words. He is currently comfortable appearing and does not appear to have any pain or active symptoms. I spoke with patient's wife, two daughters, and granddaughter. Wife confirms plan for comfort care and says she recognizes that patient is likely at end of life. She would like to take him home in the next day or so once hospice care can be arranged.    Comfort medications reviewed. Would recommend discharge home with morphine elixir concentrated (40m/mL) 5-121m(0.25-0.23m323mQ2H prn for pain and lorazepam 0.23mg78m Q4H prn for anxiety.    Case discussed with [facility] hospice liaison who has also met with family.   SUMMARY OF RECOMMENDATIONS   1. Agree with DNR and comfort care 2. Discharge home with hospice once services can be arranged 3. Will add morphine 1-2mg 42mQ1H prn for pain     Primary Diagnoses: Present on Admission: . BPH (benign prostatic hyperplasia) . COPD GOLD I  .  Essential hypertension . Liver metastasis (HCC) [place] have reviewed the medical record, interviewed the patient and family, and examined the patient. The following aspects are pertinent.  Past Medical History:  Diagnosis Date  . B12 deficiency   . BPH (benign prostatic hypertrophy)   . GERD (gastroesophageal reflux disease)   . Hyperlipidemia   . Hypertension   . Hypogonadism male   . Pancreatic cancer metastasized to liver (HCC) [place]6/2019  . Skin cancer    Social History   Socioeconomic History  . Marital status: Married    Spouse name: Not on file  . Number of children: 3  . Years of education: 16  .59ighest education level: Not on file  Occupational History    Employer: RETIRED  Social Needs  . Financial resource strain: Not on file  . Food insecurity:    Worry: Not on file    Inability: Not on file  . Transportation needs:    Medical: Not on file    Non-medical: Not on file  Tobacco Use  .  Smoking status: Current Every Day Smoker    Packs/day: 1.00    Years: 25.00    Pack years: 25.00    Types: Cigars  . Smokeless tobacco: Never Used  Substance and Sexual Activity  . Alcohol use: Yes    Alcohol/week: 9.0 oz    Types: 15 Standard drinks or equivalent per week  . Drug use: No  . Sexual activity: Yes    Partners: Female  Lifestyle  . Physical activity:    Days per week: Not on file    Minutes per session: Not on file  . Stress: Not on file  Relationships  . Social connections:    Talks on phone: Not on file    Gets together: Not on file    Attends religious service: Not on file    Active member of club or organization: Not on file    Attends meetings of clubs or organizations: Not on file    Relationship status: Not on file  Other Topics Concern  . Not on file  Social History Narrative   HSG, [employer] [facility] [place]. Married '61 2 dtrs - '68, 69; 2  sons - '72, '78. 4 grandchildren.   Work - Magazine features editor- Engineer, maintenance). Marriage is stable. He is retired. ACP - No CPR, No mechanical ventilation for more than short-term. No futile or heroic measures.    Family History  Problem Relation Age of Onset  . Heart disease Father   . Heart disease Brother        CAD/MI  . Cancer Neg Hx        no colon cancer, no prostate cancer  . Colon cancer Neg Hx   . Esophageal cancer Neg Hx   . Rectal cancer Neg Hx   . Stomach cancer Neg Hx    Scheduled Meds: . iopamidol      . scopolamine  1 patch Transdermal Q72H   Continuous Infusions: . sodium chloride 75 mL/hr at 10/09/17 1251   PRN Meds:.acetaminophen **OR** acetaminophen, antiseptic oral rinse, diphenhydrAMINE, glycopyrrolate **OR** glycopyrrolate **OR** glycopyrrolate, haloperidol **OR** haloperidol **OR** haloperidol lactate, LORazepam **OR** LORazepam **OR** LORazepam, morphine CONCENTRATE **OR** morphine CONCENTRATE, ondansetron **OR** ondansetron (ZOFRAN) IV, polyvinyl alcohol, traZODone Medications Prior to Admission:  Prior to Admission medications   Medication Sig Start Date End Date Taking? Authorizing Provider  atenolol (TENORMIN) 50 MG tablet Take 1 tablet (50 mg total) by mouth 2 (two) times daily. 05/26/16  Yes Plotnikov, Evie Lacks, MD  Cyanocobalamin (VITAMIN B-12) 1000 MCG SUBL Place 1 tablet (1,000 mcg total) under the tongue daily. 11/28/15  Yes Plotnikov, Evie Lacks, MD  doxazosin (CARDURA) 1 MG tablet Take 1 tablet (1 mg total) by mouth at bedtime. 09/12/17  Yes Plotnikov, Evie Lacks, MD  escitalopram (LEXAPRO) 20 MG tablet Take 1 tablet (20 mg total) by mouth daily. 06/27/17  Yes Plotnikov, Evie Lacks, MD  finasteride (PROSCAR) 5 MG tablet Take 1 tablet (5 mg total) by mouth daily. 08/12/17 08/12/18 Yes Plotnikov, Evie Lacks, MD  losartan (COZAAR) 100 MG tablet Take 1 tablet (100 mg total) by mouth daily. 05/26/16  Yes Plotnikov, Evie Lacks, MD  ondansetron (ZOFRAN) 4  MG tablet Take 1 tablet (4 mg total) by mouth every 6 (six) hours as needed for nausea. 10/05/17  Yes Oretha Milch D, MD  pantoprazole (PROTONIX) 40 MG tablet Take 1 tablet (40 mg total) by mouth daily. 07/30/16  Yes Plotnikov, Evie Lacks, MD  pravastatin (PRAVACHOL) 40 MG tablet Take 1  tablet (40 mg total) by mouth daily. 09/21/16  Yes Plotnikov, Evie Lacks, MD  tamsulosin (FLOMAX) 0.4 MG CAPS capsule Take 1 capsule (0.4 mg total) by mouth daily. 06/27/17  Yes Plotnikov, Evie Lacks, MD  traMADol (ULTRAM) 50 MG tablet TAKE 1/2-1 TABLET BY MOUTH EVERY 12 HOURS AS NEEDED FOR SEVERE PAIN 06/02/17  Yes Rosemarie Ax, MD  triamcinolone ointment (KENALOG) 0.1 % Apply 1 application topically 2 (two) times daily. 12/24/16  Yes Plotnikov, Evie Lacks, MD  apixaban (ELIQUIS) 5 MG TABS tablet Take 2 tablets (10 mg total) by mouth 2 (two) times daily for 7 days. 10/05/17 10/12/17  Desiree Hane, MD  apixaban (ELIQUIS) 5 MG TABS tablet Take 1 tablet (5 mg total) by mouth 2 (two) times daily. Starting on 10/12/17 10/12/17   Desiree Hane, MD  benzonatate (TESSALON) 200 MG capsule Take 1 capsule (200 mg total) by mouth 3 (three) times daily as needed for cough. 10/05/17   Desiree Hane, MD  dronabinol (MARINOL) 2.5 MG capsule Take 1 capsule (2.5 mg total) by mouth 2 (two) times daily before lunch and supper. 10/05/17   Desiree Hane, MD  HYDROcodone-acetaminophen (NORCO/VICODIN) 5-325 MG tablet Take 1-2 tablets by mouth every 4 (four) hours as needed for moderate pain. 10/05/17   Desiree Hane, MD   Allergies  Allergen Reactions  . Cardura [Doxazosin Mesylate] Other (See Comments)    dizziness   Review of Systems  Unable to perform ROS   Physical Exam  Pulmonary/Chest: Effort normal and breath sounds normal.  CTA  Abdominal: Soft. Bowel sounds are normal.  Neurological:  Lethargic but wakes when stimulated  Skin: Skin is warm and dry.  Nursing note and vitals reviewed.   Vital Signs: BP (!) 185/98    Pulse 81   Temp 98.4 F (36.9 C) (Oral)   Resp (!) 23   Ht 6' (1.829 m)   Wt 72.6 kg (160 lb)   SpO2 94%   BMI 21.70 kg/m  Pain Scale: PAINAD   Pain Score: 0-No pain   SpO2: SpO2: 94 % O2 Device:SpO2: 94 % O2 Flow Rate: .O2 Flow Rate (L/min): 2 L/min  IO: Intake/output summary: No intake or output data in the 24 hours ending 10/09/17 1423  LBM:   Baseline Weight: Weight: 72.6 kg (160 lb) Most recent weight: Weight: 72.6 kg (160 lb)     Palliative Assessment/Data:   Flowsheet Rows     Most Recent Value  Intake Tab  Referral Department  Hospitalist  Unit at Time of Referral  ER  Palliative Care Primary Diagnosis  Cancer  Date Notified  10/09/17  Palliative Care Type  New Palliative care  Reason for referral  Clarify Goals of Care  Date of Admission  10/09/17  Date first seen by Palliative Care  10/09/17  # of days Palliative referral response time  0 Day(s)  # of days IP prior to Palliative referral  0  Clinical Assessment  Palliative Performance Scale Score  10%  Psychosocial & Spiritual Assessment  Social Work Plan of Care  Clarified patient/family wishes with healthcare team  Palliative Care Outcomes  Patient/Family meeting held?  Yes  Who was at the meeting?  wife, two daughters, granddaughter  Palliative Care Outcomes  Clarified goals of care      Time In: 1400 Time Out: 1430 Time Total: 30 minutes Greater than 50%  of this time was spent counseling and coordinating care related to the above assessment and plan.  Signed by: Irean Hong, NP   Please contact Palliative Medicine Team phone at [phone] for questions and concerns.  For individual provider: See Shea Evans

## 2017-10-09 NOTE — ED Provider Notes (Addendum)
[facility] DEPT Provider Note   CSN: [id number] Arrival date & time: 10/08/17  2127     History   Chief Complaint Chief Complaint  Patient presents with  . Weakness    HPI Austin Torres is a 84 y.o. male.  84 year old male recently diagnosed with stage IV pancreatic cancer with metastasis to the liver presents to the emergency department for increasing generalized weakness.  He was discharged from the hospital on 10/05/2017 after admission for a submassive pulmonary embolus with right heart strain.  He is currently on chronic anticoagulation with Eliquis.  He states that he has had worsening nausea since discharge with decrease in his ADLs.  He feels as though he is becoming a burden to his family who have been trying to care for him at home.  Patient complaining of decreased appetite with early satiety, mostly aggravated by ongoing nausea.  He denies having any medications at home to take for nausea management.  He denies any fevers, chest pain, shortness of breath, bowel changes.  He is supposed to be undergoing palliative chemotherapy in the near future, likely Abraxane/Gemzar.     Past Medical History:  Diagnosis Date  . B12 deficiency   . BPH (benign prostatic hypertrophy)   . GERD (gastroesophageal reflux disease)   . Hyperlipidemia   . Hypertension   . Hypogonadism male   . Pancreatic cancer metastasized to liver ([place]) 09/30/2017  . Skin cancer     Patient Active Problem List   Diagnosis Date Noted  . Pancreatic cancer metastasized to liver ([place]) 09/30/2017  . Pulmonary embolism ([place]) 09/29/2017  . Liver metastasis ([place]) 09/29/2017  . Abdominal pain 09/12/2017  . Right foot pain 09/01/2017  . Epistaxis 06/27/2017  . Palpitations 06/27/2017  . Right hand pain 06/03/2017  . Dupuytren's contracture of right hand 06/03/2017  . Acute pain of left shoulder 06/03/2017  . Elevated glucose 12/24/2016  . Fatigue 12/24/2016  . Stroke  (cerebrum) ([place]) 06/30/2016  . Acute ischemic stroke ([place]) 06/29/2016  . Solitary pulmonary nodule 05/26/2016  . Cigar smoker motivated to quit 04/03/2016  . Hematuria 04/01/2016  . Nontraumatic rupture of bicep tendon 12/16/2015  . Arthropathy of shoulder region 11/28/2015  . COPD GOLD I  07/30/2015  . CAP (community acquired pneumonia) 06/23/2015  . Bronchial spasm 06/23/2015  . Rupture of quadriceps muscle 04/15/2015  . Quadriceps tendon rupture 04/04/2015  . Vitamin D deficiency 01/18/2015  . Diarrhea 10/22/2013  . Low back pain 11/27/2011  . Goals of care, counseling/discussion 01/10/2011  . TMJ tenderness 09/12/2010  . B12 deficiency 11/24/2009  . Hypogonadism male 11/20/2009  . PERIPHERAL NEUROPATHY 11/20/2009  . HYPERLIPIDEMIA 12/26/2006  . Essential hypertension 12/26/2006  . BPH (benign prostatic hyperplasia) 12/26/2006    Past Surgical History:  Procedure Laterality Date  . APPENDECTOMY    . EYE SURGERY     glaucoma procedure -both eyes  . IR FLUORO GUIDE PORT INSERTION RIGHT  10/04/2017  . IR US GUIDE BX ASP/DRAIN  10/04/2017  . IR US GUIDE VASC ACCESS RIGHT  10/04/2017  . skin cancer excision     scalp  . TONSILLECTOMY          Home Medications    Prior to Admission medications   Medication Sig Start Date End Date Taking? Authorizing Provider  atenolol (TENORMIN) 50 MG tablet Take 1 tablet (50 mg total) by mouth 2 (two) times daily. 05/26/16  Yes Plotnikov, Evie Lacks, MD  Cyanocobalamin (VITAMIN B-12) 1000 MCG  SUBL Place 1 tablet (1,000 mcg total) under the tongue daily. 11/28/15  Yes Plotnikov, Evie Lacks, MD  doxazosin (CARDURA) 1 MG tablet Take 1 tablet (1 mg total) by mouth at bedtime. 09/12/17  Yes Plotnikov, Evie Lacks, MD  escitalopram (LEXAPRO) 20 MG tablet Take 1 tablet (20 mg total) by mouth daily. 06/27/17  Yes Plotnikov, Evie Lacks, MD  finasteride (PROSCAR) 5 MG tablet Take 1 tablet (5 mg total) by mouth daily. 08/12/17 08/12/18 Yes Plotnikov, Evie Lacks,  MD  losartan (COZAAR) 100 MG tablet Take 1 tablet (100 mg total) by mouth daily. 05/26/16  Yes Plotnikov, Evie Lacks, MD  ondansetron (ZOFRAN) 4 MG tablet Take 1 tablet (4 mg total) by mouth every 6 (six) hours as needed for nausea. 10/05/17  Yes Oretha Milch D, MD  pantoprazole (PROTONIX) 40 MG tablet Take 1 tablet (40 mg total) by mouth daily. 07/30/16  Yes Plotnikov, Evie Lacks, MD  pravastatin (PRAVACHOL) 40 MG tablet Take 1 tablet (40 mg total) by mouth daily. 09/21/16  Yes Plotnikov, Evie Lacks, MD  tamsulosin (FLOMAX) 0.4 MG CAPS capsule Take 1 capsule (0.4 mg total) by mouth daily. 06/27/17  Yes Plotnikov, Evie Lacks, MD  traMADol (ULTRAM) 50 MG tablet TAKE 1/2-1 TABLET BY MOUTH EVERY 12 HOURS AS NEEDED FOR SEVERE PAIN 06/02/17  Yes Rosemarie Ax, MD  triamcinolone ointment (KENALOG) 0.1 % Apply 1 application topically 2 (two) times daily. 12/24/16  Yes Plotnikov, Evie Lacks, MD  apixaban (ELIQUIS) 5 MG TABS tablet Take 2 tablets (10 mg total) by mouth 2 (two) times daily for 7 days. 10/05/17 10/12/17  Desiree Hane, MD  apixaban (ELIQUIS) 5 MG TABS tablet Take 1 tablet (5 mg total) by mouth 2 (two) times daily. Starting on 10/12/17 10/12/17   Desiree Hane, MD  benzonatate (TESSALON) 200 MG capsule Take 1 capsule (200 mg total) by mouth 3 (three) times daily as needed for cough. 10/05/17   Desiree Hane, MD  dronabinol (MARINOL) 2.5 MG capsule Take 1 capsule (2.5 mg total) by mouth 2 (two) times daily before lunch and supper. 10/05/17   Desiree Hane, MD  HYDROcodone-acetaminophen (NORCO/VICODIN) 5-325 MG tablet Take 1-2 tablets by mouth every 4 (four) hours as needed for moderate pain. 10/05/17   Desiree Hane, MD    Family History Family History  Problem Relation Age of Onset  . Heart disease Father   . Heart disease Brother        CAD/MI  . Cancer Neg Hx        no colon cancer, no prostate cancer  . Colon cancer Neg Hx   . Esophageal cancer Neg Hx   . Rectal cancer Neg Hx   .  Stomach cancer Neg Hx     Social History Social History   Tobacco Use  . Smoking status: Current Every Day Smoker    Packs/day: 1.00    Years: 25.00    Pack years: 25.00    Types: Cigars  . Smokeless tobacco: Never Used  Substance Use Topics  . Alcohol use: Yes    Alcohol/week: 9.0 oz    Types: 15 Standard drinks or equivalent per week  . Drug use: No     Allergies   Cardura [doxazosin mesylate]   Review of Systems Review of Systems Ten systems reviewed and are negative for acute change, except as noted in the HPI.    Physical Exam Updated Vital Signs BP (!) 190/87   Pulse 77  Temp 98.4 F (36.9 C) (Oral)   Resp 20   Ht 6' (1.829 m)   Wt 72.6 kg (160 lb)   SpO2 94%   BMI 21.70 kg/m   Physical Exam  Constitutional: He is oriented to person, place, and time. He appears well-developed and well-nourished. No distress.  Chronically ill appearing. In NAD.  HENT:  Head: Normocephalic and atraumatic.  Eyes: Conjunctivae and EOM are normal. No scleral icterus.  Neck: Normal range of motion.  Cardiovascular: Normal rate, regular rhythm and intact distal pulses.  Pulmonary/Chest: Effort normal. No stridor. No respiratory distress. He has no wheezes.  Respirations even and unlabored  Abdominal: Soft. He exhibits no distension. There is no tenderness. There is no guarding.  Soft abdomen without palpable masses or rigidity. No focal TTP.  Musculoskeletal: Normal range of motion. He exhibits edema.  2+ pitting edema in BLE  Neurological: He is alert and oriented to person, place, and time. He exhibits normal muscle tone. Coordination normal.  GCS 15.  Speech is clear.  Patient answers questions appropriately and follows commands.  Moving all extremities spontaneously.  Skin: Skin is warm and dry. No rash noted. He is not diaphoretic. No erythema. No pallor.  Psychiatric: He has a normal mood and affect. His behavior is normal.  Nursing note and vitals  reviewed.    ED Treatments / Results  Labs (all labs ordered are listed, but only abnormal results are displayed) Labs Reviewed  BASIC METABOLIC PANEL - Abnormal; Notable for the following components:      Result Value   Glucose, Bld 143 (*)    All other components within normal limits  CBC - Abnormal; Notable for the following components:   WBC 14.2 (*)    RBC 4.13 (*)    Hemoglobin 11.2 (*)    HCT 35.4 (*)    RDW 16.5 (*)    Platelets 110 (*)    All other components within normal limits  URINALYSIS, ROUTINE W REFLEX MICROSCOPIC - Abnormal; Notable for the following components:   Hgb urine dipstick SMALL (*)    Ketones, ur 5 (*)    All other components within normal limits  HEPATIC FUNCTION PANEL - Abnormal; Notable for the following components:   Total Protein 6.4 (*)    Albumin 3.1 (*)    AST 65 (*)    Alkaline Phosphatase 417 (*)    Total Bilirubin 1.3 (*)    All other components within normal limits  CBG MONITORING, ED - Abnormal; Notable for the following components:   Glucose-Capillary 131 (*)    All other components within normal limits  LIPASE, BLOOD    EKG None  Radiology No results found.  Procedures Procedures (including critical care time)  Medications Ordered in ED Medications  0.9 %  sodium chloride infusion (has no administration in time range)  apixaban (ELIQUIS) tablet 10 mg (has no administration in time range)  atenolol (TENORMIN) tablet 50 mg (has no administration in time range)  benzonatate (TESSALON) capsule 200 mg (has no administration in time range)  dronabinol (MARINOL) capsule 2.5 mg (has no administration in time range)  escitalopram (LEXAPRO) tablet 20 mg (has no administration in time range)  finasteride (PROSCAR) tablet 5 mg (has no administration in time range)  HYDROcodone-acetaminophen (NORCO/VICODIN) 5-325 MG per tablet 1-2 tablet (has no administration in time range)  losartan (COZAAR) tablet 100 mg (has no administration  in time range)  ondansetron (ZOFRAN-ODT) disintegrating tablet 4 mg (has no administration in time  range)  pantoprazole (PROTONIX) EC tablet 40 mg (has no administration in time range)  pravastatin (PRAVACHOL) tablet 40 mg (has no administration in time range)  sodium chloride 0.9 % bolus 1,000 mL (0 mLs Intravenous Stopped 10/08/17 2358)  ondansetron (ZOFRAN) injection 4 mg (4 mg Intravenous Given 10/08/17 2324)    7:34 AM I have a long conversation with the patient's wife.  She has plans to arrive in the emergency department at 9:30 AM today.  I have discussed his work-up as well as the plan for social work consult, care management consultation, palliative care consultation.  All questions answered and wife expresses thanks for care.   Initial Impression / Assessment and Plan / ED Course  I have reviewed the triage vital signs and the nursing notes.  Pertinent labs & imaging results that were available during my care of the patient were reviewed by me and considered in my medical decision making (see chart for details).     84 year old male presents to the emergency department for worsening generalized weakness.  He was diagnosed with stage IV pancreatic cancer with metastasis to the liver last month.  He has plans to undergo palliative chemotherapy.  Patient discharged from the hospital on 10/05/2017.  Since returning home, he has become progressively weak with difficulty ambulating and transitioning.  He reports anorexia with persistent nausea.  He has no complaints of pain.  Laboratory work-up in the emergency department has been stable compared with discharge.  He has been hemodynamically stable and afebrile.  Plan to pursue social work, care management, palliative care consultation.  At this time, I do not believe the patient meets criteria for inpatient treatment; however, this may be beneficial if recommended by consulting services.  I have had a discussion with the wife about potential  transfer to hospice care.  She does state that she is not open to the patient going to nursing home.  She is concerned that this will bring about financial strain.  Daily medications ordered and consultations pending.  Patient care signed out to [facility], PA-C at change of shift who will assume care and disposition appropriately.  7:50 AM Went to reassess patient. Seems more sleepy. Noted to have some down turning to the L corner of his mouth. Concern for acute CVA. CT ordered. Will hold Eliquis until resulted; supposed to be on 10mg  BID.   Final Clinical Impressions(s) / ED Diagnoses   Final diagnoses:  Failure to thrive in adult    ED Discharge Orders    None       Antonietta Breach, PA-C 10/09/17 0738    Antonietta Breach, PA-C 10/09/17 0751    Antonietta Breach, PA-C 10/09/17 2836    Virgel Manifold, MD 11/02/2017 [phone]

## 2017-10-09 NOTE — H&P (Signed)
History and Physical    Austin Torres CXK:[id number] DOB: August 02, 1933 DOA: 10/08/2017  PCP: Cassandria Anger, MD Patient coming from: home  Chief Complaint: weakness  HPI: Austin Torres is a very pleasant 84 y.o. male with medical history significant for retention, hyperlipidemia, BPH, GERD,recently diagnosed with pancreatic cancer, recent DVT/PE, diffuse hepatic metastasis presents to the emergency Department chief complaint generalized weakness no oral intake. while in the emergency department suffered right MCA. Daily requesting comfort care. [facility] asked to admit.  Information is obtained from the daughter and the wife who is at the bedside. They report patiently recently diagnosed stage IV pancreatic cancer with metastasis to the liver and he just went home 4 days ago after being hospitalized for PE. They report his request was started. Patient complain the last couple days of mild intermittent nausea and generalized weakness. They reported he needed help with his ADLs.No complaints of headache dizziness syncope or near-syncope. No report of any fevers chest pain palpitation shortness of breath. No dysuria hematuria frequency or urgency. No diarrhea constipation melena bright red blood per rectum. Family does report they were planning to undergo palliative chemotherapy but daughter verbalizes concern is ability to tolerate it.    ED Course: she was afebrile hemodynamically stable and not hypoxic. CT of the head revealed right MCA patient developed left facial droop and weakness on left side. Stroke was called by the EDP. Evaluated by neurology who opined not a candidate for any intervention.  Review of Systems: As per HPI otherwise all other systems reviewed and are negative.   Ambulatory Status: moderate asist with ADLs  Past Medical History:  Diagnosis Date  . B12 deficiency   . BPH (benign prostatic hypertrophy)   . GERD (gastroesophageal reflux disease)   .  Hyperlipidemia   . Hypertension   . Hypogonadism male   . Pancreatic cancer metastasized to liver ([place]) 09/30/2017  . Skin cancer     Past Surgical History:  Procedure Laterality Date  . APPENDECTOMY    . EYE SURGERY     glaucoma procedure -both eyes  . IR FLUORO GUIDE PORT INSERTION RIGHT  10/04/2017  . IR US GUIDE BX ASP/DRAIN  10/04/2017  . IR US GUIDE VASC ACCESS RIGHT  10/04/2017  . skin cancer excision     scalp  . TONSILLECTOMY      Social History   Socioeconomic History  . Marital status: Married    Spouse name: Not on file  . Number of children: 3  . Years of education: 53  . Highest education level: Not on file  Occupational History    Employer: RETIRED  Social Needs  . Financial resource strain: Not on file  . Food insecurity:    Worry: Not on file    Inability: Not on file  . Transportation needs:    Medical: Not on file    Non-medical: Not on file  Tobacco Use  . Smoking status: Current Every Day Smoker    Packs/day: 1.00    Years: 25.00    Pack years: 25.00    Types: Cigars  . Smokeless tobacco: Never Used  Substance and Sexual Activity  . Alcohol use: Yes    Alcohol/week: 9.0 oz    Types: 15 Standard drinks or equivalent per week  . Drug use: No  . Sexual activity: Yes    Partners: Female  Lifestyle  . Physical activity:    Days per week: Not on file    Minutes per  session: Not on file  . Stress: Not on file  Relationships  . Social connections:    Talks on phone: Not on file    Gets together: Not on file    Attends religious service: Not on file    Active member of club or organization: Not on file    Attends meetings of clubs or organizations: Not on file    Relationship status: Not on file  . Intimate partner violence:    Fear of current or ex partner: Not on file    Emotionally abused: Not on file    Physically abused: Not on file    Forced sexual activity: Not on file  Other Topics Concern  . Not on file  Social History Narrative    HSG, [employer] [place]. Married '61 2 dtrs - '68, 69; 2 sons - '72, '78. 4 grandchildren.   Work - Magazine features editor- Engineer, maintenance). Marriage is stable. He is retired. ACP - No CPR, No mechanical ventilation for more than short-term. No futile or heroic measures.     Allergies  Allergen Reactions  . Cardura [Doxazosin Mesylate] Other (See Comments)    dizziness    Family History  Problem Relation Age of Onset  . Heart disease Father   . Heart disease Brother        CAD/MI  . Cancer Neg Hx        no colon cancer, no prostate cancer  . Colon cancer Neg Hx   . Esophageal cancer Neg Hx   . Rectal cancer Neg Hx   . Stomach cancer Neg Hx     Prior to Admission medications   Medication Sig Start Date End Date Taking? Authorizing Provider  atenolol (TENORMIN) 50 MG tablet Take 1 tablet (50 mg total) by mouth 2 (two) times daily. 05/26/16  Yes Plotnikov, Evie Lacks, MD  Cyanocobalamin (VITAMIN B-12) 1000 MCG SUBL Place 1 tablet (1,000 mcg total) under the tongue daily. 11/28/15  Yes Plotnikov, Evie Lacks, MD  doxazosin (CARDURA) 1 MG tablet Take 1 tablet (1 mg total) by mouth at bedtime. 09/12/17  Yes Plotnikov, Evie Lacks, MD  escitalopram (LEXAPRO) 20 MG tablet Take 1 tablet (20 mg total) by mouth daily. 06/27/17  Yes Plotnikov, Evie Lacks, MD  finasteride (PROSCAR) 5 MG tablet Take 1 tablet (5 mg total) by mouth daily. 08/12/17 08/12/18 Yes Plotnikov, Evie Lacks, MD  losartan (COZAAR) 100 MG tablet Take 1 tablet (100 mg total) by mouth daily. 05/26/16  Yes Plotnikov, Evie Lacks, MD  ondansetron (ZOFRAN) 4 MG tablet Take 1 tablet (4 mg total) by mouth every 6 (six) hours as needed for nausea. 10/05/17  Yes Oretha Milch D, MD  pantoprazole (PROTONIX) 40 MG tablet Take 1 tablet (40 mg total) by mouth daily. 07/30/16  Yes Plotnikov, Evie Lacks, MD  pravastatin (PRAVACHOL) 40 MG tablet Take 1 tablet (40 mg total) by mouth daily. 09/21/16  Yes Plotnikov, Evie Lacks, MD  tamsulosin (FLOMAX)  0.4 MG CAPS capsule Take 1 capsule (0.4 mg total) by mouth daily. 06/27/17  Yes Plotnikov, Evie Lacks, MD  traMADol (ULTRAM) 50 MG tablet TAKE 1/2-1 TABLET BY MOUTH EVERY 12 HOURS AS NEEDED FOR SEVERE PAIN 06/02/17  Yes Rosemarie Ax, MD  triamcinolone ointment (KENALOG) 0.1 % Apply 1 application topically 2 (two) times daily. 12/24/16  Yes Plotnikov, Evie Lacks, MD  apixaban (ELIQUIS) 5 MG TABS tablet Take 2 tablets (10 mg total) by mouth 2 (two) times daily for  7 days. 10/05/17 10/12/17  Desiree Hane, MD  apixaban (ELIQUIS) 5 MG TABS tablet Take 1 tablet (5 mg total) by mouth 2 (two) times daily. Starting on 10/12/17 10/12/17   Desiree Hane, MD  benzonatate (TESSALON) 200 MG capsule Take 1 capsule (200 mg total) by mouth 3 (three) times daily as needed for cough. 10/05/17   Desiree Hane, MD  dronabinol (MARINOL) 2.5 MG capsule Take 1 capsule (2.5 mg total) by mouth 2 (two) times daily before lunch and supper. 10/05/17   Desiree Hane, MD  HYDROcodone-acetaminophen (NORCO/VICODIN) 5-325 MG tablet Take 1-2 tablets by mouth every 4 (four) hours as needed for moderate pain. 10/05/17   Desiree Hane, MD    Physical Exam: Vitals:   10/09/17 0830 10/09/17 0900 10/09/17 1000 10/09/17 1145  BP: (!) 187/93 (!) 183/86 (!) 178/84 (!) 197/100  Pulse: 78 77 76 85  Resp: 16   (!) 28  Temp:      TempSrc:      SpO2: 91% 97% 91% 92%  Weight:      Height:         General:  Appears pale, calm and not uncomfortable Eyes:  PERRL, EOMI, normal lids, iris ENT:  grossly normal hearing, lips & tongue, mucous membranes of his mouth are pale and dry Neck:  no LAD, masses or thyromegaly Cardiovascular:  RRR, no m/r/g. No LE edema.  Respiratory:  Normal effort respiration somewhat shallow breath sounds slightly distant Abdomen:  soft, ntnd, positive bowel sounds Skin:  no rash or induration seen on limited exam Musculoskeletal:  grossly normal tone BUE/BLE, good ROM, no bony abnormality Psychiatric:   grossly normal mood and affect, speech fluent and appropriate, AOx3 Neurologic:  Facial droop and slurred speech quite lethargic grip on right 3 out of 5 oriented to self  Labs on Admission: I have personally reviewed following labs and imaging studies  CBC: Recent Labs  Lab 10/03/17 0204 10/04/17 0330 10/05/17 0355 10/08/17 2152  WBC 12.0* 12.0* 11.0* 14.2*  HGB 10.9* 10.8* 10.7* 11.2*  HCT 34.0* 33.8* 33.0* 35.4*  MCV 83.5 84.1 83.8 85.7  PLT 179 180 146* 989*   Basic Metabolic Panel: Recent Labs  Lab 10/03/17 0621 10/04/17 0330 10/05/17 0355 10/08/17 2152  NA 135 138 137 138  K 3.6 3.7 3.2* 3.7  CL 105 106 103 104  CO2 21* 19* 22 22  GLUCOSE 125* 130* 107* 143*  BUN 19 15 17 19   CREATININE 1.30* 1.16 1.05 1.03  CALCIUM 8.7* 8.7* 8.7* 8.9   GFR: Estimated Creatinine Clearance: 57.8 mL/min (by C-G formula based on SCr of 1.03 mg/dL). Liver Function Tests: Recent Labs  Lab 10/03/17 0621 10/04/17 0330 10/05/17 0355 10/08/17 2152  AST 49* 50* 50* 65*  ALT 41 44 41 49  ALKPHOS 305* 320* 316* 417*  BILITOT 1.1 1.3* 1.0 1.3*  PROT 6.1* 5.8* 5.5* 6.4*  ALBUMIN 3.2* 2.9* 2.8* 3.1*   Recent Labs  Lab 10/08/17 2152  LIPASE 24   No results for input(s): AMMONIA in the last 168 hours. Coagulation Profile: Recent Labs  Lab 10/04/17 0330 10/09/17 0753  INR 1.16 1.64   Cardiac Enzymes: No results for input(s): CKTOTAL, CKMB, CKMBINDEX, TROPONINI in the last 168 hours. BNP (last 3 results) No results for input(s): PROBNP in the last 8760 hours. HbA1C: No results for input(s): HGBA1C in the last 72 hours. CBG: Recent Labs  Lab 10/08/17 2206  GLUCAP 131*   Lipid Profile: No results  for input(s): CHOL, HDL, LDLCALC, TRIG, CHOLHDL, LDLDIRECT in the last 72 hours. Thyroid Function Tests: No results for input(s): TSH, T4TOTAL, FREET4, T3FREE, THYROIDAB in the last 72 hours. Anemia Panel: No results for input(s): VITAMINB12, FOLATE, FERRITIN, TIBC, IRON,  RETICCTPCT in the last 72 hours. Urine analysis:    Component Value Date/Time   COLORURINE YELLOW 10/09/2017 0608   APPEARANCEUR CLEAR 10/09/2017 0608   LABSPEC 1.015 10/09/2017 0608   PHURINE 6.0 10/09/2017 0608   GLUCOSEU NEGATIVE 10/09/2017 0608   GLUCOSEU NEGATIVE 02/14/2017 1141   HGBUR SMALL (A) 10/09/2017 0608   BILIRUBINUR NEGATIVE 10/09/2017 0608   KETONESUR 5 (A) 10/09/2017 0608   PROTEINUR NEGATIVE 10/09/2017 0608   UROBILINOGEN 0.2 02/14/2017 1141   NITRITE NEGATIVE 10/09/2017 0608   LEUKOCYTESUR NEGATIVE 10/09/2017 0608    Creatinine Clearance: Estimated Creatinine Clearance: 57.8 mL/min (by C-G formula based on SCr of 1.03 mg/dL).  Sepsis Labs: @LABRCNTIP (procalcitonin:4,lacticidven:4) ) Recent Results (from the past 240 hour(s))  MRSA PCR Screening     Status: None   Collection Time: 09/29/17  4:33 PM  Result Value Ref Range Status   MRSA by PCR NEGATIVE NEGATIVE Final    Comment:        The GeneXpert MRSA Assay (FDA approved for NASAL specimens only), is one component of a comprehensive MRSA colonization surveillance program. It is not intended to diagnose MRSA infection nor to guide or monitor treatment for MRSA infections. Performed at [facility], [place] [address] [place], [place] [address]      Radiological Exams on Admission: Ct Head Wo Contrast  Result Date: 10/09/2017 CLINICAL DATA:  Stage IV pancreatic cancer. Down turned mouth. Focal neurologic deficit over 6 hours duration. No reported injury. EXAM: CT HEAD WITHOUT CONTRAST TECHNIQUE: Contiguous axial images were obtained from the base of the skull through the vertex without intravenous contrast. COMPARISON:  06/29/2016 head CT. FINDINGS: Brain: There is loss of the normal gray-white differentiation in the right basal ganglia and right insula. No evidence of parenchymal hemorrhage or extra-axial fluid collection. No mass lesion, mass effect, or midline shift. Generalized cerebral volume  loss. Nonspecific prominent subcortical and periventricular white matter hypodensity, most in keeping with chronic small vessel ischemic change. No ventriculomegaly. Vascular: No acute abnormality. Skull: No evidence of calvarial fracture. Sinuses/Orbits: Mucoperiosteal thickening and partial opacification of the bilateral maxillary sinuses and ethmoidal air cells. Other:  The mastoid air cells are unopacified. IMPRESSION: 1. Loss of the normal gray-white differentiation in the right basal ganglia and right insula, worrisome for acute right MCA stroke. No acute intracranial hemorrhage. 2. Generalized cerebral volume loss and prominent chronic small vessel ischemic changes in the cerebral white matter. 3. Paranasal sinusitis, of uncertain chronicity. Critical Value/emergent results were called by telephone at the time of interpretation on 10/09/2017 at 8:37 am to Dr. Laverta [place], who verbally acknowledged these results. Electronically Signed   By: Ilona Sorrel M.D.   On: 10/09/2017 08:38   Ct Cerebral Perfusion W Contrast  Result Date: 10/09/2017 CLINICAL DATA:  Progressive right-sided weakness. Stage IV pancreatic cancer. Recent large pulmonary embolus. EXAM: CT PERFUSION BRAIN TECHNIQUE: Multiphase CT imaging of the brain was performed following IV bolus contrast injection. Subsequent parametric perfusion maps were calculated using RAPID software. CONTRAST:  127mL ISOVUE-370 IOPAMIDOL (ISOVUE-370) INJECTION 76% COMPARISON:  CT head without contrast from the same day. FINDINGS: CT Brain Perfusion Findings: Noncontrast images demonstrate a large right MCA territory infarct. Diffuse loss of gray-white differentiation throughout the right MCA territory is  better appreciated on this exam. This involves the right caudate head, right internal capsule, right lentiform nucleus, and right insular ribbon. Posterior frontal and parietal cortical involvement is present at the ganglionic and super ganglionic level. CBF (<30%) Volume:  178mL Perfusion (Tmax>6.0s) volume: 226mL Mismatch Volume: 51mL ASPECTS on noncontrast CT Head: 2/10 on additional images performed with this exam. Infarct Core: 162 mL Infarction Location:Right MCA territory Vascular images for arterial and venous input and output locations demonstrate a right M1 occlusion. IMPRESSION: 1. Large right MCA territory nonhemorrhagic infarct with possible sparing of the anterior frontal lobe. 2. Probable right M1 segment large vessel occlusion. These results were called by telephone at the time of interpretation on 10/09/2017 at 11:57 am to Dr. Roland Rack , who verbally acknowledged these results. Electronically Signed   By: San Morelle M.D.   On: 10/09/2017 11:57    EKG:  Personally reviewed : Sinus rhythm IVCD, consider atypical RBBB Probable lateral infarct, age indeterminate  Assessment/Plan Principal Problem:   Stroke (cerebrum) ([place]) Active Problems:   Essential hypertension   BPH (benign prostatic hyperplasia)   COPD GOLD I    Pulmonary embolism ([facility])   Liver metastasis ([facility])   Pancreatic cancer metastasized to liver ([place])   #1. Acute stroke. CT is noted above.Large right MCA territory nonhemorrhagic infarct with possible sparing of the anterior frontal lobe. Probable right M1 segment large vessel occlusion. eValuated by neurology who opined not a candidate for any intervention. Neurology recommending palliative care. At the time of my exam family confirming comfort care only wishing to take him home with hospice -Admit -nothing by mouth -IV fluids -Palliative care consult -End-of-life orders set -comfort care  #2. Hypertension.ikely related to above. Medicines include atenolol, doxazosin, losartan -Holding these meds for now  #3. Stage IV pancreatic cancer with metastasis to the liver. Family considered palliative chemotherapy. Today before comfort care    DVT prophylaxis:   Code Status: dnr  Family Communication:  Wife and  daughter Disposition Plan: home with hospice per family request  Consults called: palliative care. Dr Leonel Ramsay neuro  Admission status: inpatient    Radene Gunning MD [facility]  If 7PM-7AM, please contact night-coverage [email or web address] Password [facility]  10/09/2017, 12:29 PM

## 2017-10-09 NOTE — ED Notes (Signed)
Tele neurologist speaking to patient.

## 2017-10-09 NOTE — ED Notes (Addendum)
Weakness noted by EDP and this RN after results of head CT. Patient noted to have new left sided facial droop and weakness on his left side. Unable to move left extremities. Code stroke called by EDP. Tele neuro cart at the bedside.

## 2017-10-09 NOTE — Progress Notes (Signed)
   10/09/17 1500  Clinical Encounter Type  Visited With Patient and family together;Health care provider  Visit Type Follow-up;Spiritual support  Referral From Physician  Consult/Referral To Chaplain  Spiritual Encounters  Spiritual Needs  (contact Idelle Crouch )   Responded to a page from attending physician that the family was requesting a Idelle Crouch and they  Attend [employer] [place].  Made contact with the priest and let the family know.  Escorted Father Lacinda Axon back to be at bedside and stayed until he finished.  Family was appreciative of the contact. Will follow and support as needed. Chaplain Katherene Ponto

## 2017-10-09 NOTE — ED Notes (Signed)
ED Provider at the bedside.

## 2017-10-09 NOTE — Progress Notes (Signed)
[facility] Liaison: RN visit  Notified by Dr. Evangeline Gula of patient/family interst in [facility] services at home after discharge. After speaking with family it was determined they do want home hospice with [facility]. Carles Collet, [place] notified.  Chart and patient information under review by [facility] physician. Hospice eligibility pending at this time.  Writer spoke with Romie Minus, wife and Daughters Gerald Stabs and Cecille Rubin at bedside to initiate education related to hospice philosophy, services and team approach to care.     Family verbalized understanding of information given. Per discussion, plan is for patient to go home via [facility] at discharge.   Please send signed and completed DNR form home with patient/family. Patient will need prescriptions for discharge comfort medications.  DME needs have been discussed, patient currently has the following equipment in the home: Hospital bed, 3N1, walker.  Patient/family requests the following DME for delivery to the home: OBT and oxygen. Hospital liaison contacted [facility] to arrange delivery to the home. Home address has been verified and is correct in the chart.  Romie Minus is the family member to contact to arrange time of delivery.  [facility] Referral Center aware of the above. Please notify [facility] when patient is ready to leave the unit at discharge. (Call [phone] or [phone] after 5pm.) [facility] information and contact numbers given to Romie Minus at time of visit. Above information shared with Carles Collet, [facility].  Please call with any hospice related questions.  Thank you for this referral.  Farrel Gordon, RN, [employer] [facility] Hospital Liaison [phone] ? Hospital liaisons are now on [place].

## 2017-10-09 NOTE — ED Provider Notes (Signed)
Austin Torres is a 84 y.o. male, signed out to me at shift change who presented to ed with complaint of generalized weakness. Pt had been recently dc from hospital with new diagnosis  Of stage 4 pancreatic ca and PE. On xarelto. Plan to start pallative chemo and radiation. Family sent pt to ED bc he had difficulty standing and walking, generally weak.   8:45 AM Patient CT scan is concerning for acute right MCA.  I reassessed him, on my exam, patient with left facial droop, slurred speech, unable to move left arm, flaccid paralysis, able to wiggle foot, however toes extended, dorsiflexed.  Positive Babinski sign.  Unable to pick up left leg off the stretcher.  Left-sided neglect.  Activated code stroke.  I am unsure exactly the time of patient's last seen normal, however patient was seen during the night in the morning by a prior provider and on their exam, patient is moving all extremities and has normal speech.  It was noted by a previous provider upon her reassessment of the patient at 8 AM that he started having left facial droop and that is why CT scan was ordered.  9:42 AM I spoke with Dr. Leonel Ramsay with neurology, patient is not a good candidate for any type of interventional procedures.  I will get telemetry neurology consult as well since patient is at [facility] long.  I discussed with family options of making patient comfort care and staying at [place] where she is transferred to [facility] closer monitoring and possible procedures.  They are unsure of plan at this time.  10:23 AM Patient had tele-neurology consult, who recommended CTA of the head and neck with perfusion studies.  Patient discussed with Dr. Tobias Alexander, he will be transferred to [facility] ED for further imaging.  I did have discussion with family about possibly making patient to palliative given all of his medical issues, however patient's family at this time want any minimally invasive treatment, apart from intubation and CPR. [facility]  called for transfer.   CRITICAL CARE Performed by: Senie Lanese Total critical care time: 40 minutes Critical care time was exclusive of separately billable procedures and treating other patients. Critical care was necessary to treat or prevent imminent or life-threatening deterioration. Critical care was time spent personally by me on the following activities: development of treatment plan with patient and/or surrogate as well as nursing, discussions with consultants, evaluation of patient's response to treatment, examination of patient, obtaining history from patient or surrogate, ordering and performing treatments and interventions, ordering and review of laboratory studies, ordering and review of radiographic studies, pulse oximetry and re-evaluation of patient's condition.   Vitals:   10/09/17 1145 10/09/17 1215 10/09/17 1230 10/09/17 1245  BP: (!) 197/100 (!) 188/99 (!) 185/96 (!) 185/98  Pulse: 85 84 84 81  Resp: (!) 28 18 (!) 25 (!) 23  Temp:      TempSrc:      SpO2: 92% 92% 94% 94%  Weight:      Height:          Jeannett Senior, PA-C 10/09/17 1321    Margette Fast, MD 10/09/17 1904

## 2017-10-09 NOTE — ED Notes (Signed)
Neurologist at bedside. 

## 2017-10-09 NOTE — ED Notes (Signed)
Patient placed on 2L O2 d/t oxygen saturation in upper 80s low 90s. O2 sat now 95% on 2L. Will continue to monitor.

## 2017-10-09 NOTE — Progress Notes (Signed)
   10/09/17 1145  Clinical Encounter Type  Visited With Patient;Family;Health care provider  Visit Type ED;Critical Care  Referral From Chaplain  Consult/Referral To Chaplain  Spiritual Encounters  Spiritual Needs Emotional  Stress Factors  Family Stress Factors Family relationships;Health changes   Responded to a page from [place], to support this patient/family coming from [facility].  Patient arrived and I supported the wife and daughter until physician was ready to meet with them.  Family discussed the patient's condition and I escorted them to bedside.  Family is in a state of shock as they stated 2 weeks ago he was mowing the grass then was diagnosed with cancer and now this.  Will continue to support and follow the family/patient through out this day. Chaplain Katherene Ponto

## 2017-10-09 NOTE — Consult Note (Signed)
[facility] TeleNeurology Consult Services  Impression:  RO Acute Ischemic Stroke.  Patient presents with presentations suggestive of right hemispheric/right MCA distribution infarct.   Patient is not considered to be candidate for TPA thrombolytics, since he has been on Eliquis and his took his last toes last night.  Symptoms are suggestive of right M1 occlusion.   CT head already shows areas of core infarct with ASPECTS below 4, and not likely candidate for intervention.  Discussed with ED staff possible plans for transfer for advanced care.   Pateint was transferred for tertiary care.   CTP obtained at that time showed significant core infarct with little mismatch.     Comments: [facility] contacted:  [phone] [facility] at bedside: [phone] NIHSS assessment time:  106     Discussed with ED provider Please call with questions  Burnett Harry, MD [facility]   -----------------------------------------------------------------------------------------  CC Stroke alert  History of Present Illness   Patient is a pleasant 84 year old man who presents with history of "confusion".  He was last normal last night , observed and exam documented at 2138 at his baseline.  He was then noted this morning to have left sided weakness, exam noted at 750 am with a change.  LSN last night 2138.   Stroke alert is called and I was asked to see patient via telemedicine.     Diagnostic: CT heads reviewed.   Exam:    1A: Level of Consciousness - Patient sleepy , obtunded.-2  1B: Ask Month and Age - Neither question correctly -2 1C: 'Blink Eyes' & 'Squeeze Hands' - doesnt follow commands -2 2: Test Horizontal Extraocular Movements - gaze preference -1 3: Test Visual Fields - No Visual Loss 4: Test Facial Palsy - left facial weakness -2 5A: Test Left Arm Motor Drift - No movement -4 5B: Test Right Arm Motor Drift - No Drift for 10 Seconds 6A: Test Left Leg Motor Drift -  slight antigravity movement to pain,-3 6B: Test Right Leg Motor Drift - No Drift for 5 Seconds 7: Test Limb Ataxia - Cannot examine 8: Test Sensation - left sided sensory loss. -2 9: Test Language/Aphasia - nonflulent speech -3 10: Test Dysarthria - marked dysarthria -3 11: Test Extinction/Inattention -  left sided visual and sensory neglect -2  NIHSS 13   No current facility-administered medications on file prior to encounter.    Current Outpatient Medications on File Prior to Encounter  Medication Sig Dispense Refill  . atenolol (TENORMIN) 50 MG tablet Take 1 tablet (50 mg total) by mouth 2 (two) times daily. 180 tablet 3  . Cyanocobalamin (VITAMIN B-12) 1000 MCG SUBL Place 1 tablet (1,000 mcg total) under the tongue daily. 100 tablet 3  . doxazosin (CARDURA) 1 MG tablet Take 1 tablet (1 mg total) by mouth at bedtime. 30 tablet 11  . escitalopram (LEXAPRO) 20 MG tablet Take 1 tablet (20 mg total) by mouth daily. 30 tablet 11  . finasteride (PROSCAR) 5 MG tablet Take 1 tablet (5 mg total) by mouth daily. 90 tablet 3  . losartan (COZAAR) 100 MG tablet Take 1 tablet (100 mg total) by mouth daily. 90 tablet 3  . ondansetron (ZOFRAN) 4 MG tablet Take 1 tablet (4 mg total) by mouth every 6 (six) hours as needed for nausea. 20 tablet 0  . pantoprazole (PROTONIX) 40 MG tablet Take 1 tablet (40 mg total) by mouth daily. 30 tablet 11  . pravastatin (PRAVACHOL) 40 MG tablet Take 1 tablet (40 mg  total) by mouth daily. 90 tablet 2  . tamsulosin (FLOMAX) 0.4 MG CAPS capsule Take 1 capsule (0.4 mg total) by mouth daily. 30 capsule 11  . traMADol (ULTRAM) 50 MG tablet TAKE 1/2-1 TABLET BY MOUTH EVERY 12 HOURS AS NEEDED FOR SEVERE PAIN 30 tablet 0  . triamcinolone ointment (KENALOG) 0.1 % Apply 1 application topically 2 (two) times daily. 30 g 0  . apixaban (ELIQUIS) 5 MG TABS tablet Take 2 tablets (10 mg total) by mouth 2 (two) times daily for 7 days. 28 tablet 0  . [START ON 10/12/2017] apixaban  (ELIQUIS) 5 MG TABS tablet Take 1 tablet (5 mg total) by mouth 2 (two) times daily. Starting on 10/12/17 60 tablet 0  . benzonatate (TESSALON) 200 MG capsule Take 1 capsule (200 mg total) by mouth 3 (three) times daily as needed for cough. 20 capsule 0  . dronabinol (MARINOL) 2.5 MG capsule Take 1 capsule (2.5 mg total) by mouth 2 (two) times daily before lunch and supper. 30 capsule 0  . HYDROcodone-acetaminophen (NORCO/VICODIN) 5-325 MG tablet Take 1-2 tablets by mouth every 4 (four) hours as needed for moderate pain. 30 tablet 0   Past Medical History:  Diagnosis Date  . B12 deficiency   . BPH (benign prostatic hypertrophy)   . GERD (gastroesophageal reflux disease)   . Hyperlipidemia   . Hypertension   . Hypogonadism male   . Pancreatic cancer metastasized to liver ([place]) 09/30/2017  . Skin cancer     Medical Decision Making:  - Extensive number of diagnosis or management options are considered above. - Extensive amount of complex data reviewed. - High risk of complication and/or morbidity or mortality are associated with differential diagnostic considerations above.  - There may be Uncertain outcome and increased probability of prolonged functional impairment or high probability of severe prolonged functional impairment associated with some of these differential diagnosis.  Medical Data Reviewed:  1.Data reviewed include clinical labs, radiology,Medical Tests; 2.Tests results discussed w/performing or interpreting physician; 3.Obtaining/reviewing old medical records;  4.Obtaining case history from another source;  5.Independent review of image, tracing or specimen.    Patient was informed the Neurology Consult would happen via telehealth (remote video) and consented to receiving care in this manner.

## 2017-10-09 NOTE — Care Management Note (Signed)
Case Management Note  Patient Details  Name: Austin Torres MRN: [id number] Date of Birth: 1934/03/24  Subjective/Objective:                 [facility] following for home hospice needs. Will DC with oxygen. Family wants to take home. Audrea Muscat with [facility] following. Will need PTAR transportation home.    Action/Plan:   Expected Discharge Date:                  Expected Discharge Plan:     In-House Referral:     Discharge planning Services     Post Acute Care Choice:    Choice offered to:     DME Arranged:    DME Agency:     HH Arranged:    HH Agency:     Status of Service:     If discussed at [employer] of Stay Meetings, dates discussed:    Additional Comments:  Carles Collet, RN 10/09/2017, 3:48 PM

## 2017-10-09 NOTE — ED Notes (Signed)
RN called Austin Torres ED d/t patient losing his [place] (necklace) - RN over at [employer] does have his necklace - family notified and they plan on going to pick it up.

## 2017-10-09 NOTE — Progress Notes (Signed)
Pt was lying in bed when I arrived, medical staff was present and ascertaining to determine plan of care. Pt's wife and daughter were bedside. Pt's daughter was at times sobbingly tearful, wife was also tearful. Pt talked to me and when asked how long he has been married he gave his standard answer, according to family, "too long". Pt's daughter and wife said he mowed the lawn two weeks ago. 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 They said he became very weak yesterday and was brought by ambulance to the hospital last night. Pt's wife and daughter are seemingly having a difficult time adjusting to the apparent rapid decline of pt. Pt's daughter and wife said they are just not ready to let him go or give up. They seemed encouraged as patient was able to continue to talk to them. Pt was transferred to [facility] and family left to follow them. I contacted Clayton Lefort at [facility] to meet pt in ED lobby when they arrive. Please page if additional assistance is needed. [place], MDiv   10/09/17 1000  Clinical Encounter Type  Visited With Patient and family together

## 2017-10-09 NOTE — ED Notes (Signed)
Patient with RN to CT 2.

## 2017-10-09 NOTE — ED Notes (Signed)
No respiratory or acute distress noted resting in bed with eyes closed still no urine in condom catheter informed provider. Call light in reach.

## 2017-10-09 NOTE — Consult Note (Addendum)
Referring Physician: Lajean Saver, MD    Chief Complaint: Weakness   HPI: Austin Torres is an 84 y.o. male past medical history significant for hypertension, dyslipidemia history of CVA in July 2018, BPH, recently admitted to [facility] on 09/29/2017, during that admission was diagnosed with pancreatic cancer as well as acute submassive bilateral PE with right heart strain, right subclavian DVT and bilateral lower extremity DVT and started on Eliquis-discharged on 10/05/2016.  Patient presented to [facility] ED on 10/08/2017, with complaints of worsening nausea, decreased appetite and decreased mobility.  Per family at bedside patient since discharge has had progressive weakness and the day before admission he was having difficulty ambulating and getting out of bed.  Taken to [place] along ER for further evaluation.  Patient spent the night in the ED , 815 this am, was his  last known normal.  Staff found a short time later with acute right facial droop, slurred speech and left arm flaccidity where he was able to wiggle his toes.  Immediate CT of the head as concerning for acute right MCA CVA.  Stroke code was called.  Patient was evaluated by telemetry neurology and was not considered a candidate for TPA thrombolytics due to currently taking Eliquis with last dose last night and ASPECTS score  below 4, candidate for neuro intervention. Patient was transferred to [facility] ED for CT imaging perfusion CT perfusion showed Large right MCA territory nonhemorrhagic infarct with possible sparing of the anterior frontal lobe.  On assessment patient could speak with mild dysarthria he had left facial droop weakness and left arm flaccidity but did withdraw extremities to harsh stimuli.  Patient has clamped his eyes shut tight does not have any noticeable gaze deviations and pupils are reactive.  Patient does follow commands but often times to follow commands but grossly somnolent.   Family denies patient  having any recent falls, complaints of chest pain or shortness of breath, fevers or chills.  Patient has just had generalized malaise, decreased appetite with worsening nausea.  LSN:10/09/17 at 0815am tPA Given: No: Not candidate due to use current use of Eliquis  Premorbid modified Rankin scale (mRS): 3    Past Medical History:  Diagnosis Date  . B12 deficiency   . BPH (benign prostatic hypertrophy)   . GERD (gastroesophageal reflux disease)   . Hyperlipidemia   . Hypertension   . Hypogonadism male   . Pancreatic cancer metastasized to liver ([place]) 09/30/2017  . Skin cancer     Past Surgical History:  Procedure Laterality Date  . APPENDECTOMY    . EYE SURGERY     glaucoma procedure -both eyes  . IR FLUORO GUIDE PORT INSERTION RIGHT  10/04/2017  . IR US GUIDE BX ASP/DRAIN  10/04/2017  . IR US GUIDE VASC ACCESS RIGHT  10/04/2017  . skin cancer excision     scalp  . TONSILLECTOMY      Family History  Problem Relation Age of Onset  . Heart disease Father   . Heart disease Brother        CAD/MI  . Cancer Neg Hx        no colon cancer, no prostate cancer  . Colon cancer Neg Hx   . Esophageal cancer Neg Hx   . Rectal cancer Neg Hx   . Stomach cancer Neg Hx    Social History:  reports that he has been smoking cigars.  He has a 25.00 pack-year smoking history. He has never used smokeless  tobacco. He reports that he drinks about 9.0 oz of alcohol per week. He reports that he does not use drugs.  Allergies:  Allergies  Allergen Reactions  . Cardura [Doxazosin Mesylate] Other (See Comments)    dizziness    Medications:  . atenolol  50 mg Oral BID  . dronabinol  2.5 mg Oral BID AC  . escitalopram  20 mg Oral Daily  . finasteride  5 mg Oral Daily  . iopamidol      . losartan  100 mg Oral Daily  . pantoprazole  40 mg Oral Daily  . pravastatin  40 mg Oral q1800    ROS: Review of systems unable to accurately be obtained by patient due to somnolescent amount of  encephalopathy.  Otherwise as per obtained in HPI by her family  Physical Examination: Blood pressure (!) 178/84, pulse 76, temperature 98.4 F (36.9 C), temperature source Oral, resp. rate 16, height 6' (1.829 m), weight 72.6 kg (160 lb), SpO2 91 %. HEENT-  Normocephalic, no lesions, without obvious abnormality.  Normal external eye and conjunctiva.   Cardiovascular- S1-S2 audible, pulses palpable throughout   Lungs-no rhonchi or wheezing noted, no excessive working breathing.  Saturations within normal limits Abdomen- All 4 quadrants palpated and nontender Musculoskeletal-no joint tenderness trace edema bilateral lower extremities Skin-warm and dry, right chest port in place  Neurological Examination Mental Status: Alert, to self place and situation, moderately follows commands and often falls back asleep, not following three-step commands.  Patient with mild dysarthria  Cranial Nerves: II:  III,IV, VI: Patient has clamped his eyes tightly shut and does follow commands to close otitis but refuses to open his eyes.  No noticeable gaze deviation, unable to accurately assess vision and extraocular motions but left pupil sluggishly reactive.   V,VII: Left facial droop VIII: Hearing intact to voice XI: Effort related weakness in attempt to shrug shoulders XII: midline tongue extension Motor: Patient is flaccid on the left side with no effort against gravity on his left extremities right great toe pointing upwards Right : Upper extremity   5/5      Lower extremity   5/5      Tone and bulk:normal tone throughout; no atrophy noted Sensory: Decreased sensation to pinprick on left side but does withdraw left leg and left arm to painful stimuli and grimaces left side of the face to painful stimuli Deep Tendon Reflexes: 2+ and symmetric throughout Plantars: Right: downgoing   Left: downgoing Cerebellar: Patient able to accurately perform finger-to-nose test on right side but unable to raise left  arm, asked to run his right heel with patient he just crosses his legs. Gait: Not assessed secondary to gait.  NIHSS 1a Level of Conscious.:Patient sleepy 2 1b LOC Questions: 0 1c LOC Commands: 0 2 Best Gaze: 0 3 Visual: 0 4 Facial Palsy:2 5a Motor Arm - left: 0 5b Motor Arm - Right: 0 6a Motor Leg - Left: 4 6b Motor Leg - Right: 0 7 Limb Ataxia: 0 8 Sensory: 1 9 Best Language:1 10 Dysarthria: 1 11 Extinct. and Inatten.: 2 TOTAL: 13  Results for orders placed or performed during the hospital encounter of 10/08/17 (from the past 48 hour(s))  Basic metabolic panel     Status: Abnormal   Collection Time: 10/08/17  9:52 PM  Result Value Ref Range   Sodium 138 135 - 145 mmol/L   Potassium 3.7 3.5 - 5.1 mmol/L   Chloride 104 101 - 111 mmol/L   CO2 22  22 - 32 mmol/L   Glucose, Bld 143 (H) 65 - 99 mg/dL   BUN 19 6 - 20 mg/dL   Creatinine, Ser 1.03 0.61 - 1.24 mg/dL   Calcium 8.9 8.9 - 10.3 mg/dL   GFR calc non Af Amer >60 >60 mL/min   GFR calc Af Amer >60 >60 mL/min    Comment: (NOTE) The eGFR has been calculated using the CKD EPI equation. This calculation has not been validated in all clinical situations. eGFR's persistently <60 mL/min signify possible Chronic Kidney Disease.    Anion gap 12 5 - 15    Comment: Performed at [facility], [place] [address], [place], [place] [address]  CBC     Status: Abnormal   Collection Time: 10/08/17  9:52 PM  Result Value Ref Range   WBC 14.2 (H) 4.0 - 10.5 K/uL   RBC 4.13 (L) 4.22 - 5.81 MIL/uL   Hemoglobin 11.2 (L) 13.0 - 17.0 g/dL   HCT 35.4 (L) 39.0 - 52.0 %   MCV 85.7 78.0 - 100.0 fL   MCH 27.1 26.0 - 34.0 pg   MCHC 31.6 30.0 - 36.0 g/dL   RDW 16.5 (H) 11.5 - 15.5 %   Platelets 110 (L) 150 - 400 K/uL    Comment: CLOSTRIDIUM CLOSTRIDIOFORME REPEATED TO VERIFY PLATELET COUNT CONFIRMED BY SMEAR Performed at [facility] [address], [place], [place] [address]   Hepatic function panel      Status: Abnormal   Collection Time: 10/08/17  9:52 PM  Result Value Ref Range   Total Protein 6.4 (L) 6.5 - 8.1 g/dL   Albumin 3.1 (L) 3.5 - 5.0 g/dL   AST 65 (H) 15 - 41 U/L   ALT 49 17 - 63 U/L   Alkaline Phosphatase 417 (H) 38 - 126 U/L   Total Bilirubin 1.3 (H) 0.3 - 1.2 mg/dL   Bilirubin, Direct 0.4 0.1 - 0.5 mg/dL   Indirect Bilirubin 0.9 0.3 - 0.9 mg/dL    Comment: Performed at [facility], [place] [address], [place], [place] [address]  Lipase, blood     Status: None   Collection Time: 10/08/17  9:52 PM  Result Value Ref Range   Lipase 24 11 - 51 U/L    Comment: Performed at [facility], [place] [address] [place], [place] [address]  CBG monitoring, ED     Status: Abnormal   Collection Time: 10/08/17 10:06 PM  Result Value Ref Range   Glucose-Capillary 131 (H) 65 - 99 mg/dL  Urinalysis, Routine w reflex microscopic     Status: Abnormal   Collection Time: 10/09/17  6:08 AM  Result Value Ref Range   Color, Urine YELLOW YELLOW   APPearance CLEAR CLEAR   Specific Gravity, Urine 1.015 1.005 - 1.030   pH 6.0 5.0 - 8.0   Glucose, UA NEGATIVE NEGATIVE mg/dL   Hgb urine dipstick SMALL (A) NEGATIVE   Bilirubin Urine NEGATIVE NEGATIVE   Ketones, ur 5 (A) NEGATIVE mg/dL   Protein, ur NEGATIVE NEGATIVE mg/dL   Nitrite NEGATIVE NEGATIVE   Leukocytes, UA NEGATIVE NEGATIVE   RBC / HPF 0-5 0 - 5 RBC/hpf   WBC, UA 0-5 0 - 5 WBC/hpf   Bacteria, UA NONE SEEN NONE SEEN   Mucus PRESENT     Comment: Performed at [facility], [place] [address] [place], [place] [address]  Protime-INR     Status: Abnormal   Collection Time: 10/09/17  7:53 AM  Result Value Ref Range   Prothrombin Time 19.3 (H) 11.4 - 15.2 seconds   INR 1.64     Comment: Performed at [facility], [place] [address], [place], [place] [address]  Heparin level (unfractionated)     Status: Abnormal   Collection Time: 10/09/17  7:53 AM  Result Value Ref Range    Heparin Unfractionated >2.20 (H) 0.30 - 0.70 IU/mL    Comment: Performed at [facility], [place] [address] [place], [place] [address]  APTT     Status: None   Collection Time: 10/09/17  7:53 AM  Result Value Ref Range   aPTT 30 24 - 36 seconds    Comment: Performed at [facility], [place] [address], [place], [place] [address]   Ct Head Wo Contrast 10/09/2017  IMPRESSION:  1. Loss of the normal gray-white differentiation in the right basal ganglia and right insula, worrisome for acute right MCA stroke. No acute intracranial hemorrhage.  2. Generalized cerebral volume loss and prominent chronic small vessel ischemic changes in the cerebral white matter.  3. Paranasal sinusitis, of uncertain chronicity.    CT Head Cerebral Perfusion  10/09/17 IMPRESSION: 1. Large right MCA territory nonhemorrhagic infarct with possible sparing of the anterior frontal lobe. 2. Probable right M1 segment large vessel occlusion.  Assessment: 84 y.o. male st medical history significant for hypertension, dyslipidemia history of CVA in July 2018, BPH, recently admitted to [facility] on 09/29/2017, during that admission was diagnosed with pancreatic cancer as well as acute submassive bilateral PE with right heart strain, right subclavian DVT and bilateral lower extremity DVT and started on Eliquis-discharged on 10/05/2016.  Patient presented to [facility] ED on 10/08/2017, with complaints of worsening nausea, decreased appetite and decreased mobility.  This a.m. After  815, patient was noted to have acute left facial droop.  CT Noncon office had was concerning for right MCA CVA.  He was transferred to [facility] for further evaluation  1.  Large right ischemic MCA stroke.  As confirmed by CT head and CT perfusion.  Patient with dense left hemiparesis, decreased sensation on the left side and large left facial droop.  Patient not aphasic but does not follow all commands.  Due to his  current comorbidities and use of Eliquis he is not a candidate for TPA and due to current prognosis of his cancer diagnosis and other comorbidities, not a candidate for neuro intervention.  And also do to the severity of the stroke patient is at high risk for cerebral edema or lack of progression of his symptoms and possibly fidelity.  Dr. Leonel Ramsay Attendance discussed prognosis and possible treatments with the patient's family wife and daughter-family wanted the patient to be comfort measure and did not want any further imaging or testing.  They requested patient to be able to discharge home.  They were agreeable to palliative care evaluation and hospice eval for home needs.  Will defer all anticoagulation as well as stroke prevention meds at this time.   Stroke Risk Factors - hypercoagulable state, hyperlipidemia and hypertension 2.  Newly diagnosed metastatic pancreatic cancer 3.  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  History of a CVA January 2018 was previously on Plavix and aspirin which was discontinued in the setting of Xarelto initiation 5.  Hypertension  Plan: - Comfort measure-no  further head imaging or heroics as requested for family . Pt is DNR - Palliative care consult - Hold all anticoagulants at this time due to comfort measures - Telemetry monitoring -  Frequent neuro checks   Austin Moores DNP Neuro-hospitalist Team [phone] 10/09/2017, 11:22 AM I have seen the patient and reviewed the above note.  He clinically has right MCA syndrome. 84 year old male with massive right MCA infarct in the setting of already poor prognosis.  At this point, I do not feel that he is a candidate for any type of intervention as the infarct is already completed(and indeed there was early CT change throughout pre-much the entire  MCA territory on his initial CT from 8 AM, aspects 2-3.)  I did advise on my initial evaluation CT perfusion/repeat CT to confirm this finding.  And indeed it did.  I discussed his poor prognosis with his family, and they have agreed that he would only want comfort care in this type of situation.  Stroke team will be available on an as-needed basis, please call with further questions or concerns.  Roland Rack, MD [facility] [phone]  If 7pm- 7am, please page neurology on call as listed in [place].

## 2017-10-10 ENCOUNTER — Inpatient Hospital Stay: Payer: Medicare Other | Admitting: Internal Medicine

## 2017-10-10 ENCOUNTER — Telehealth: Payer: Self-pay | Admitting: Internal Medicine

## 2017-10-10 DIAGNOSIS — I639 Cerebral infarction, unspecified: Secondary | ICD-10-CM

## 2017-10-10 LAB — HEPARIN LEVEL (UNFRACTIONATED)

## 2017-10-10 MED ORDER — HALOPERIDOL LACTATE 2 MG/ML PO CONC: 0.5000 mg | ORAL | 0 refills | Status: AC | PRN

## 2017-10-10 MED ORDER — SCOPOLAMINE 1 MG/3DAYS TD PT72: 1.0000 | MEDICATED_PATCH | TRANSDERMAL | 0 refills | Status: AC

## 2017-10-10 MED ORDER — LORAZEPAM 2 MG/ML PO CONC: 1.0000 mg | ORAL | 0 refills | Status: AC | PRN

## 2017-10-10 MED ORDER — MORPHINE SULFATE (CONCENTRATE) 10 MG/0.5ML PO SOLN: 5.0000 mg | ORAL | 0 refills | Status: AC | PRN

## 2017-10-10 MED ORDER — ACETAMINOPHEN 650 MG RE SUPP: 650.0000 mg | Freq: Four times a day (QID) | RECTAL | 0 refills | Status: AC | PRN

## 2017-10-10 MED ORDER — POLYVINYL ALCOHOL 1.4 % OP SOLN: 1.0000 [drp] | Freq: Four times a day (QID) | OPHTHALMIC | 0 refills | Status: AC | PRN

## 2017-10-10 NOTE — Care Management Note (Signed)
Case Management Note  Patient Details  Name: Austin Torres MRN: [id number] Date of Birth: Jan 26, 1934  Subjective/Objective:                    Action/Plan:  Discussed discharge plan with patient's family ( spouse Romie Minus and daughter Gerald Stabs) at bedside. Confirmed discharge plan is home with [facility] ( confirmed with Tracey at [facility] has accepted referral and aware discharge is today).   Patient will need PTAR transportation home. Confirmed [facility] address with Gerald Stabs. [facility] have ordered hospital bed , over the bed table and oxygen through [facility]. Gerald Stabs' brother is at home waiting delivery. Once delivered Gerald Stabs will call NCM and NCM will arrange transportation.   Paged MD to sign gold DNR form. Expected Discharge Date:  10/10/17               Expected Discharge Plan:  Home w Hospice Care  In-House Referral:     Discharge planning Services  CM Consult  Post Acute Care Choice:  Hospice Choice offered to:  Adult Children, Spouse  DME Arranged:  Oxygen, Hospital bed DME Agency:  [place]:    [facility] Agency:  [facility]  Status of Service:  In process, will continue to follow  If discussed at Long Length of Stay Meetings, dates discussed:    Additional Comments:  Marilu Favre, RN 10/10/2017, 10:53 AM

## 2017-10-10 NOTE — Consult Note (Signed)
   [facility] Inpatient Consult   10/10/2017  CHARLIES RAYBURN 11/03/1933 [id number]  Patient screened for re-admission [facility] for possible services in the [facility] plan.   Chart review reveals patient is to transition to [place].  No [facility] management needs identified or needed.   For questions contact:   Natividad Brood, RN BSN [facility] Liaison  [phone] business mobile phone Toll free office [phone]

## 2017-10-10 NOTE — Telephone Encounter (Signed)
Copied from [place] [phone]. Topic: Inquiry >> Oct 10, 2017  3:59 PM Oliver Pila B wrote: Reason for CRM: hospice and palliative care called to state pt is being discharged from the hospital and is needing for pcp to confirm that he is the attending physician for hospice care, contact [phone] for the orders

## 2017-10-10 NOTE — Telephone Encounter (Signed)
Hospice notified that Plotnikov will be attending

## 2017-10-10 NOTE — Progress Notes (Signed)
SLP Cancellation Note  Patient Details Name: Austin Torres MRN: [id number] DOB: [birth year]   Cancelled treatment:       Reason Eval/Treat Not Completed: Medical issues which prohibited therapy(only responsive to pain per RN)  Gabriel Rainwater [place], CCC-SLP [phone]  Gabriel Rainwater [place] 10/10/2017, 2:37 PM

## 2017-10-10 NOTE — Progress Notes (Signed)
Patient discharged to home with hospice via [facility].

## 2017-10-10 NOTE — Progress Notes (Addendum)
Nutrition Brief Note  Chart reviewed. Pt now transitioning to comfort care/ home hospice; plan to discharge today.  No further nutrition interventions warranted at this time.  Please re-consult as needed.   America Sandall A. Jimmye Norman, RD, LDN, CDE Pager: [phone] After hours Pager: [phone]

## 2017-10-10 NOTE — Care Management (Signed)
Confirmed DME including oxygen has been delivered. Called [facility] , patient is 10 th on list. [facility] unable to give estimated time of arrival. Family at bedside and aware.   Magdalen Spatz RN BSN [phone]

## 2017-10-10 NOTE — Discharge Summary (Addendum)
Discharge Summary  Austin Torres SWF:[id number] DOB: 1933/08/14  PCP: Cassandria Anger, MD  Admit date: 10/08/2017 Discharge date: 10/10/2017  Time spent: <19mins  Recommendations for Outpatient Follow-up:  1. Discharge home with home hospice, comfort measures  2. Prognosis days to weeks  Discharge Diagnoses:  Active Hospital Problems   Diagnosis Date Noted  . Stroke (cerebrum) ([place]) 06/30/2016  . End of life care 10/09/2017  . Pancreatic cancer metastasized to liver ([place]) 09/30/2017  . Liver metastasis ([place]) 09/29/2017  . Pulmonary embolism ([place]) 09/29/2017  . COPD GOLD I  07/30/2015  . BPH (benign prostatic hyperplasia) 12/26/2006  . Essential hypertension 12/26/2006    Resolved Hospital Problems  No resolved problems to display.    Discharge Condition: stable  Diet recommendation: comfort feeds if patient desires  Filed Weights   10/08/17 2137  Weight: 72.6 kg (160 lb)    History of present illness: (per admitting MD DR Evangeline Gula and PA ms Black) PCP: Plotnikov, Evie Lacks, MD Patient coming from: home  Chief Complaint: weakness  HPI: Austin Torres is a very pleasant 84 y.o. male with medical history significant for retention, hyperlipidemia, BPH, GERD,recently diagnosed with pancreatic cancer, recent DVT/PE, diffuse hepatic metastasis presents to the emergency Department chief complaint generalized weakness no oral intake. while in the emergency department suffered right MCA. Daily requesting comfort care. [facility] hospitalists asked to admit.  Information is obtained from the daughter and the wife who is at the bedside. They report patiently recently diagnosed stage IV pancreatic cancer with metastasis to the liver and he just went home 4 days ago after being hospitalized for PE. They report his request was started. Patient complain the last couple days of mild intermittent nausea and generalized weakness. They reported he needed help with his ADLs.No complaints  of headache dizziness syncope or near-syncope. No report of any fevers chest pain palpitation shortness of breath. No dysuria hematuria frequency or urgency. No diarrhea constipation melena bright red blood per rectum. Family does report they were planning to undergo palliative chemotherapy but daughter verbalizes concern is ability to tolerate it.    ED Course: she was afebrile hemodynamically stable and not hypoxic. CT of the head revealed right MCA patient developed left facial droop and weakness on left side. Stroke was called by the EDP. Evaluated by neurology who opined not a candidate for any intervention.    Hospital Course:  Principal Problem:   Stroke (cerebrum) [facility]) Active Problems:   Essential hypertension   BPH (benign prostatic hyperplasia)   COPD GOLD I    Pulmonary embolism (H[facility])   Liver metastasis (H[facility])   Pancreatic cancer metastasized to liver ([facility])   End of life care   Acute large right mca stroke.  -CT is noted above.Large right MCA territory nonhemorrhagic infarct with possible sparing of the anterior frontal lobe. Probable right M1 segment large vessel occlusion. --palliative care consulted, home hospice arranged. -meds for comforts prescribed, patient is to d/c home with home hospice today after equipment delivered. Case discussed with hospice liaison Bevely Palmer.  Stage IV pancreatic cancer with metastasis to the liver  recently diagnosed PE   Code status: DNR  Procedures:  none  Consultations:  Palliative care  Neurology  Hospice   Discharge Exam: BP (!) 174/100 (BP Location: Left Arm)   Pulse 95   Temp 97.6 F (36.4 C) (Oral)   Resp (!) 26   Ht 6' (1.829 m)   Wt 72.6 kg (160 lb)   SpO2 98%  BMI 21.70 kg/m   General: lethargic, aphasia,  Cardiovascular: RRR Respiratory: CTABL Neuro: lethargic, does not follow commands, does not move left side  Discharge Instructions You were cared for by a hospitalist during your hospital stay.  If you have any questions about your discharge medications or the care you received while you were in the hospital after you are discharged, you can call the unit and asked to speak with the hospitalist on call if the hospitalist that took care of you is not available. Once you are discharged, your primary care physician will handle any further medical issues. Please note that NO REFILLS for any discharge medications will be authorized once you are discharged, as it is imperative that you return to your primary care physician (or establish a relationship with a primary care physician if you do not have one) for your aftercare needs so that they can reassess your need for medications and monitor your lab values.  Discharge Instructions    Diet general   Complete by:  As directed    Comfort feeds if patient desires.     Allergies as of 10/10/2017      Reactions   Cardura [doxazosin Mesylate] Other (See Comments)   dizziness      Medication List    STOP taking these medications   apixaban 5 MG Tabs tablet Commonly known as:  ELIQUIS   atenolol 50 MG tablet Commonly known as:  TENORMIN   benzonatate 200 MG capsule Commonly known as:  TESSALON   doxazosin 1 MG tablet Commonly known as:  CARDURA   dronabinol 2.5 MG capsule Commonly known as:  MARINOL   escitalopram 20 MG tablet Commonly known as:  LEXAPRO   finasteride 5 MG tablet Commonly known as:  PROSCAR   HYDROcodone-acetaminophen 5-325 MG tablet Commonly known as:  NORCO/VICODIN   losartan 100 MG tablet Commonly known as:  COZAAR   ondansetron 4 MG tablet Commonly known as:  ZOFRAN   pantoprazole 40 MG tablet Commonly known as:  PROTONIX   pravastatin 40 MG tablet Commonly known as:  PRAVACHOL   tamsulosin 0.4 MG Caps capsule Commonly known as:  FLOMAX   traMADol 50 MG tablet Commonly known as:  ULTRAM   Vitamin B-12 1000 MCG Subl     TAKE these medications   acetaminophen 650 MG suppository Commonly  known as:  TYLENOL Place 1 suppository (650 mg total) rectally every 6 (six) hours as needed for mild pain (or Fever >/= 101).   haloperidol 2 MG/ML solution Commonly known as:  HALDOL Place 0.3 mLs (0.6 mg total) under the tongue every 4 (four) hours as needed for agitation (or delirium).   LORazepam 2 MG/ML concentrated solution Commonly known as:  ATIVAN Place 0.5 mLs (1 mg total) under the tongue every 4 (four) hours as needed for anxiety, sedation or sleep.   morphine CONCENTRATE 10 MG/0.5ML Soln concentrated solution Place 0.25 mLs (5 mg total) under the tongue every 2 (two) hours as needed for moderate pain (or dyspnea).   polyvinyl alcohol 1.4 % ophthalmic solution Commonly known as:  LIQUIFILM TEARS Place 1 drop into both eyes 4 (four) times daily as needed for dry eyes.   scopolamine 1 MG/3DAYS Commonly known as:  TRANSDERM-SCOP Place 1 patch (1.5 mg total) onto the skin every 3 (three) days.   triamcinolone ointment 0.1 % Commonly known as:  KENALOG Apply 1 application topically 2 (two) times daily.      Allergies  Allergen Reactions  .  Cardura [Doxazosin Mesylate] Other (See Comments)    dizziness      The results of significant diagnostics from this hospitalization (including imaging, microbiology, ancillary and laboratory) are listed below for reference.    Significant Diagnostic Studies: Ct Abdomen Pelvis W Wo Contrast  Result Date: 09/29/2017 CLINICAL DATA:  Right upper quadrant abdominal pain, weight loss, and anorexia. Hepatic masses on recent ultrasound. EXAM: CT CHEST WITH CONTRAST CT ABDOMEN AND PELVIS WITH AND WITHOUT CONTRAST TECHNIQUE: Multidetector CT imaging of the chest was performed during intravenous contrast administration. Multidetector CT imaging of the abdomen and pelvis was performed following the standard protocol before and during bolus administration of intravenous contrast. CONTRAST:  178mL ISOVUE-300 IOPAMIDOL (ISOVUE-300) INJECTION 61%  COMPARISON:  Chest CT on 10/04/2016 and AP CT on 04/05/2016 FINDINGS: CT CHEST FINDINGS Cardiovascular: Pulmonary embolism is seen in both the right and left pulmonary arteries and old bilateral lobar branches. No saddle embolus seen. RV/LV ratio is 0.98, consistent with right heart strain. Thrombosis of the right subclavian vein is seen with adjacent soft tissue edema. Mediastinum/Lymph Nodes: No masses or pathologically enlarged lymph nodes identified. Lungs/Pleura: 7 mm pulmonary nodule in the posterior right lower lobe and 5 mm pulmonary nodule in the superior left lower lobe are stable since previous studies, consistent with benign etiology. No suspicious pulmonary nodules or masses identified. No evidence of pulmonary infiltrate or pleural effusion. Musculoskeletal:  No suspicious bone lesions identified. CT ABDOMEN AND PELVIS FINDINGS Hepatobiliary: Numerous rim enhancing hypovascular masses are seen throughout the right and left hepatic lobes which are new since previous study, consistent with diffuse liver metastases. Largest mass in the medial segment left lobe measures 7.1 x 5.5 cm on image 19/4. Gallbladder is unremarkable. No evidence of biliary ductal dilatation. Pancreas: A new hypovascular mass is seen in the pancreatic tail which measures 5.4 x 2.5 cm on image 38/4, consistent with pancreatic adenocarcinoma. Spleen:  Within normal limits in size and appearance. Adrenals/Urinary tract: Normal appearance adrenal glands and right kidney. Small cyst again seen in upper pole of left kidney. A new 2 cm peripheral low-attenuation lesion is seen in the lower pole the left kidney which has ill-defined margins. This could be due to pyelonephritis, infarct, or neoplasm. No evidence of hydronephrosis. Mild diffuse bladder wall thickening is seen, likely due to chronic bladder outlet obstruction. Stomach/Bowel: Small hiatal hernia noted. No evidence of obstruction, inflammatory process, or abnormal fluid  collections. Diverticulosis is seen, without evidence of diverticulitis. Vascular/Lymphatic: No pathologically enlarged lymph nodes identified. No abdominal aortic aneurysm. Aortic atherosclerosis. Reproductive: Mildly enlarged prostate gland with indentation of bladder base. Other:  None. Musculoskeletal:  No suspicious bone lesions identified. IMPRESSION: 5.4 cm mass in pancreatic tail, consistent with pancreatic adenocarcinoma. Diffuse liver metastases. No evidence of metastatic disease within chest or pelvis. New 2 cm ill-defined low-attenuation lesion in lower pole of left kidney, which is indeterminate. Differential diagnosis includes pyelonephritis, infarct, and neoplasm. Suggest correlation with urinalysis and continued attention on follow-up CT. 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 Acute bilateral pulmonary embolism, with CT evidence of right heart strain (RV/LV Ratio = 0.98) consistent with at least submassive (intermediate risk) PE. The presence of right heart strain has been associated with an increased risk of morbidity and mortality. Please activate Code PE by paging [phone]. Critical Value/emergent results were called by telephone at the time of interpretation on 09/29/2017 at 9:54 am to Dr. Lew Dawes , who verbally acknowledged these results. Electronically Signed   By: Jenny Reichmann  Kris Hartmann M.D.   On: 09/29/2017 09:58   Ct Head Wo Contrast  Result Date: 10/09/2017 CLINICAL DATA:  Stage IV pancreatic cancer. Down turned mouth. Focal neurologic deficit over 6 hours duration. No reported injury. EXAM: CT HEAD WITHOUT CONTRAST TECHNIQUE: Contiguous axial images were obtained from the base of the skull through the vertex without intravenous contrast. COMPARISON:  06/29/2016 head CT. FINDINGS: Brain: There is loss of the normal gray-white differentiation in the right basal ganglia and right insula. No evidence of parenchymal hemorrhage or extra-axial fluid collection. No mass lesion, mass  effect, or midline shift. Generalized cerebral volume loss. Nonspecific prominent subcortical and periventricular white matter hypodensity, most in keeping with chronic small vessel ischemic change. No ventriculomegaly. Vascular: No acute abnormality. Skull: No evidence of calvarial fracture. Sinuses/Orbits: Mucoperiosteal thickening and partial opacification of the bilateral maxillary sinuses and ethmoidal air cells. Other:  The mastoid air cells are unopacified. IMPRESSION: 1. Loss of the normal gray-white differentiation in the right basal ganglia and right insula, worrisome for acute right MCA stroke. No acute intracranial hemorrhage. 2. Generalized cerebral volume loss and prominent chronic small vessel ischemic changes in the cerebral white matter. 3. Paranasal sinusitis, of uncertain chronicity. Critical Value/emergent results were called by telephone at the time of interpretation on 10/09/2017 at 8:37 am to Dr. Laverta Baltimore, who verbally acknowledged these results. Electronically Signed   By: Ilona Sorrel M.D.   On: 10/09/2017 08:38   Ct Chest W Contrast  Result Date: 09/29/2017 CLINICAL DATA:  Right upper quadrant abdominal pain, weight loss, and anorexia. Hepatic masses on recent ultrasound. EXAM: CT CHEST WITH CONTRAST CT ABDOMEN AND PELVIS WITH AND WITHOUT CONTRAST TECHNIQUE: Multidetector CT imaging of the chest was performed during intravenous contrast administration. Multidetector CT imaging of the abdomen and pelvis was performed following the standard protocol before and during bolus administration of intravenous contrast. CONTRAST:  165mL ISOVUE-300 IOPAMIDOL (ISOVUE-300) INJECTION 61% COMPARISON:  Chest CT on 10/04/2016 and AP CT on 04/05/2016 FINDINGS: CT CHEST FINDINGS Cardiovascular: Pulmonary embolism is seen in both the right and left pulmonary arteries and old bilateral lobar branches. No saddle embolus seen. RV/LV ratio is 0.98, consistent with right heart strain. Thrombosis of the right  subclavian vein is seen with adjacent soft tissue edema. Mediastinum/Lymph Nodes: No masses or pathologically enlarged lymph nodes identified. Lungs/Pleura: 7 mm pulmonary nodule in the posterior right lower lobe and 5 mm pulmonary nodule in the superior left lower lobe are stable since previous studies, consistent with benign etiology. No suspicious pulmonary nodules or masses identified. No evidence of pulmonary infiltrate or pleural effusion. Musculoskeletal:  No suspicious bone lesions identified. CT ABDOMEN AND PELVIS FINDINGS Hepatobiliary: Numerous rim enhancing hypovascular masses are seen throughout the right and left hepatic lobes which are new since previous study, consistent with diffuse liver metastases. Largest mass in the medial segment left lobe measures 7.1 x 5.5 cm on image 19/4. Gallbladder is unremarkable. No evidence of biliary ductal dilatation. Pancreas: A new hypovascular mass is seen in the pancreatic tail which measures 5.4 x 2.5 cm on image 38/4, consistent with pancreatic adenocarcinoma. Spleen:  Within normal limits in size and appearance. Adrenals/Urinary tract: Normal appearance adrenal glands and right kidney. Small cyst again seen in upper pole of left kidney. A new 2 cm peripheral low-attenuation lesion is seen in the lower pole the left kidney which has ill-defined margins. This could be due to pyelonephritis, infarct, or neoplasm. No evidence of hydronephrosis. Mild diffuse bladder wall thickening is seen, likely  due to chronic bladder outlet obstruction. Stomach/Bowel: Small hiatal hernia noted. No evidence of obstruction, inflammatory process, or abnormal fluid collections. Diverticulosis is seen, without evidence of diverticulitis. Vascular/Lymphatic: No pathologically enlarged lymph nodes identified. No abdominal aortic aneurysm. Aortic atherosclerosis. Reproductive: Mildly enlarged prostate gland with indentation of bladder base. Other:  None. Musculoskeletal:  No suspicious  bone lesions identified. IMPRESSION: 5.4 cm mass in pancreatic tail, consistent with pancreatic adenocarcinoma. Diffuse liver metastases. No evidence of metastatic disease within chest or pelvis. New 2 cm ill-defined low-attenuation lesion in lower pole of left kidney, which is indeterminate. Differential diagnosis includes pyelonephritis, infarct, and neoplasm. Suggest correlation with urinalysis and continued attention on follow-up CT. 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 Acute bilateral pulmonary embolism, with CT evidence of right heart strain (RV/LV Ratio = 0.98) consistent with at least submassive (intermediate risk) PE. The presence of right heart strain has been associated with an increased risk of morbidity and mortality. Please activate Code PE by paging [phone]. Critical Value/emergent results were called by telephone at the time of interpretation on 09/29/2017 at 9:54 am to Dr. Lew Dawes , who verbally acknowledged these results. Electronically Signed   By: Earle Gell M.D.   On: 09/29/2017 09:58   US Abdomen Complete  Result Date: 09/20/2017 CLINICAL DATA:  Generalized abdominal pain EXAM: ABDOMEN ULTRASOUND COMPLETE COMPARISON:  04/05/2016 CT of the abdomen, 10/04/2016 CT of the chest FINDINGS: Gallbladder: Gallbladder is well distended with gallbladder sludge. No definitive stones are seen. No wall thickening is noted. Common bile duct: Diameter: 8 mm although within normal limits for the patient's given age. Liver: Multiple hypoechoic masses are noted throughout the liver. The largest of these measures 9 cm in greatest dimension. These are consistent with metastatic disease. Portal vein is patent on color Doppler imaging with normal direction of blood flow towards the liver. IVC: No abnormality visualized. Pancreas: Visualized portion unremarkable. Spleen: Size and appearance within normal limits. Right Kidney: Length: 9.8 cm. Echogenicity within normal limits. No mass or  hydronephrosis visualized. Left Kidney: Length: 11 cm. Echogenicity within normal limits. No mass or hydronephrosis visualized. Abdominal aorta: No aneurysm visualized. Other findings: None. IMPRESSION: Changes consistent with diffuse hepatic metastatic disease. Further workup by means of CT of the abdomen and pelvis with contrast is recommended. Chest CT may be helpful as well for further evaluation. Tissue sampling is also indicated. These results will be called to the ordering clinician or representative by the Radiologist Assistant, and communication documented in the PACS or zVision Dashboard. Electronically Signed   By: Inez Catalina M.D.   On: 09/20/2017 09:25   Ir US Guide Vasc Access Right  Result Date: 10/05/2017 CLINICAL DATA:  Pancreatic carcinoma, metastatic. Needs durable venous access for chemotherapy regimen. EXAM: TUNNELED PORT CATHETER PLACEMENT WITH ULTRASOUND AND FLUOROSCOPIC GUIDANCE FLUOROSCOPY TIME:  0.1 minute; 9.9 uGym2 DAP ANESTHESIA/SEDATION: Intravenous Fentanyl and Versed were administered as conscious sedation during continuous monitoring of the patient's level of consciousness and physiological / cardiorespiratory status by the radiology RN, with a total moderate sedation time of 17 minutes. TECHNIQUE: The procedure, risks, benefits, and alternatives were explained to the patient. Questions regarding the procedure were encouraged and answered. The patient understands and consents to the procedure. As antibiotic prophylaxis, cefazolin 2 g was ordered pre-procedure and administered intravenously within one hour of incision. Patency of the right IJ vein was confirmed with ultrasound with image documentation. An appropriate skin site was determined. Skin site was marked. Region was prepped using maximum  barrier technique including cap and mask, sterile gown, sterile gloves, large sterile sheet, and Chlorhexidine as cutaneous antisepsis. The region was infiltrated locally with 1%  lidocaine. Under real-time ultrasound guidance, the right IJ vein was accessed with a 21 gauge micropuncture needle; the needle tip within the vein was confirmed with ultrasound image documentation. Needle was exchanged over a 018 guidewire for transitional dilator which allowed passage of the [facility] wire into the IVC. Over this, the transitional dilator was exchanged for a 5 [place] MPA catheter. A small incision was made on the right anterior chest wall and a subcutaneous pocket fashioned. The power-injectable port was positioned and its catheter tunneled to the right IJ dermatotomy site. The MPA catheter was exchanged over an Amplatz wire for a peel-away sheath, through which the port catheter, which had been trimmed to the appropriate length, was advanced and positioned under fluoroscopy with its tip at the cavoatrial junction. Spot chest radiograph confirms good catheter position and no pneumothorax. The pocket was closed with deep interrupted and subcuticular continuous 3-0 Monocryl sutures. The port was flushed per protocol. The incisions were covered with Dermabond then covered with a sterile dressing. COMPLICATIONS: COMPLICATIONS None immediate IMPRESSION: Technically successful right IJ power-injectable port catheter placement. Ready for routine use. Electronically Signed   By: Lucrezia Europe M.D.   On: 10/05/2017 08:22   Ir US Guide Bx Asp/drain  Result Date: 10/05/2017 CLINICAL DATA:  Pancreatic tumor with multiple liver lesions EXAM: ULTRASOUND-GUIDED CORE LIVER BIOPSY TECHNIQUE: An ultrasound guided liver biopsy was thoroughly discussed with the patient and questions were answered. The benefits, risks, alternatives, and complications were also discussed. The patient understands and wishes to proceed with the procedure. A verbal as well as written consent was obtained. Survey ultrasound of the liver was performed, representative lesion localized, and an appropriate skin entry site was determined. Skin  site was marked, prepped with chlorhexidine, and draped in usual sterile fashion, and infiltrated locally with 1% lidocaine. Intravenous Fentanyl and Versed were administered as conscious sedation during continuous monitoring of the patient's level of consciousness and physiological / cardiorespiratory status by the radiology RN, with a total moderate sedation time of 10 minutes. A 17 gauge trocar needle was advanced under ultrasound guidance into the liver to the margin of the lesion. 3 solid-appearing coaxial 18gauge core samples were then obtained through the guide needle. The guide needle was removed. Post procedure scans demonstrate no apparent complication. COMPLICATIONS: COMPLICATIONS None immediate FINDINGS: Multiple hypoechoic solid liver lesions. Representative core biopsy samples obtained as above. IMPRESSION: 1. Technically successful ultrasound guided core liver lesion biopsy. Electronically Signed   By: Lucrezia Europe M.D.   On: 10/05/2017 08:24   Ct Cerebral Perfusion W Contrast  Result Date: 10/09/2017 CLINICAL DATA:  Progressive right-sided weakness. Stage IV pancreatic cancer. Recent large pulmonary embolus. EXAM: CT PERFUSION BRAIN TECHNIQUE: Multiphase CT imaging of the brain was performed following IV bolus contrast injection. Subsequent parametric perfusion maps were calculated using RAPID software. CONTRAST:  182mL ISOVUE-370 IOPAMIDOL (ISOVUE-370) INJECTION 76% COMPARISON:  CT head without contrast from the same day. FINDINGS: CT Brain Perfusion Findings: Noncontrast images demonstrate a large right MCA territory infarct. Diffuse loss of gray-white differentiation throughout the right MCA territory is better appreciated on this exam. This involves the right caudate head, right internal capsule, right lentiform nucleus, and right insular ribbon. Posterior frontal and parietal cortical involvement is present at the ganglionic and super ganglionic level. CBF (<30%) Volume: 186mL Perfusion  (Tmax>6.0s) volume: 265mL Mismatch Volume:  42mL ASPECTS on noncontrast CT Head: 2/10 on additional images performed with this exam. Infarct Core: 162 mL Infarction Location:Right MCA territory Vascular images for arterial and venous input and output locations demonstrate a right M1 occlusion. IMPRESSION: 1. Large right MCA territory nonhemorrhagic infarct with possible sparing of the anterior frontal lobe. 2. Probable right M1 segment large vessel occlusion. These results were called by telephone at the time of interpretation on 10/09/2017 at 11:57 am to Dr. Roland Rack , who verbally acknowledged these results. Electronically Signed   By: San Morelle M.D.   On: 10/09/2017 11:57   Ir Fluoro Guide Port Insertion Right  Result Date: 10/05/2017 CLINICAL DATA:  Pancreatic carcinoma, metastatic. Needs durable venous access for chemotherapy regimen. EXAM: TUNNELED PORT CATHETER PLACEMENT WITH ULTRASOUND AND FLUOROSCOPIC GUIDANCE FLUOROSCOPY TIME:  0.1 minute; 9.9 uGym2 DAP ANESTHESIA/SEDATION: Intravenous Fentanyl and Versed were administered as conscious sedation during continuous monitoring of the patient's level of consciousness and physiological / cardiorespiratory status by the radiology RN, with a total moderate sedation time of 17 minutes. TECHNIQUE: The procedure, risks, benefits, and alternatives were explained to the patient. Questions regarding the procedure were encouraged and answered. The patient understands and consents to the procedure. As antibiotic prophylaxis, cefazolin 2 g was ordered pre-procedure and administered intravenously within one hour of incision. Patency of the right IJ vein was confirmed with ultrasound with image documentation. An appropriate skin site was determined. Skin site was marked. Region was prepped using maximum barrier technique including cap and mask, sterile gown, sterile gloves, large sterile sheet, and Chlorhexidine as cutaneous antisepsis. The region was  infiltrated locally with 1% lidocaine. Under real-time ultrasound guidance, the right IJ vein was accessed with a 21 gauge micropuncture needle; the needle tip within the vein was confirmed with ultrasound image documentation. Needle was exchanged over a 018 guidewire for transitional dilator which allowed passage of the [facility] wire into the IVC. Over this, the transitional dilator was exchanged for a 5 [place] MPA catheter. A small incision was made on the right anterior chest wall and a subcutaneous pocket fashioned. The power-injectable port was positioned and its catheter tunneled to the right IJ dermatotomy site. The MPA catheter was exchanged over an Amplatz wire for a peel-away sheath, through which the port catheter, which had been trimmed to the appropriate length, was advanced and positioned under fluoroscopy with its tip at the cavoatrial junction. Spot chest radiograph confirms good catheter position and no pneumothorax. The pocket was closed with deep interrupted and subcuticular continuous 3-0 Monocryl sutures. The port was flushed per protocol. The incisions were covered with Dermabond then covered with a sterile dressing. COMPLICATIONS: COMPLICATIONS None immediate IMPRESSION: Technically successful right IJ power-injectable port catheter placement. Ready for routine use. Electronically Signed   By: Lucrezia Europe M.D.   On: 10/05/2017 08:22    Microbiology: No results found for this or any previous visit (from the past 240 hour(s)).   Labs: Basic Metabolic Panel: Recent Labs  Lab 10/04/17 0330 10/05/17 0355 10/08/17 2152  NA 138 137 138  K 3.7 3.2* 3.7  CL 106 103 104  CO2 19* 22 22  GLUCOSE 130* 107* 143*  BUN 15 17 19   CREATININE 1.16 1.05 1.03  CALCIUM 8.7* 8.7* 8.9   Liver Function Tests: Recent Labs  Lab 10/04/17 0330 10/05/17 0355 10/08/17 2152  AST 50* 50* 65*  ALT 44 41 49  ALKPHOS 320* 316* 417*  BILITOT 1.3* 1.0 1.3*  PROT 5.8* 5.5* 6.4*  ALBUMIN  2.9* 2.8* 3.1*     Recent Labs  Lab 10/08/17 2152  LIPASE 24   No results for input(s): AMMONIA in the last 168 hours. CBC: Recent Labs  Lab 10/04/17 0330 10/05/17 0355 10/08/17 2152  WBC 12.0* 11.0* 14.2*  HGB 10.8* 10.7* 11.2*  HCT 33.8* 33.0* 35.4*  MCV 84.1 83.8 85.7  PLT 180 146* 110*   Cardiac Enzymes: No results for input(s): CKTOTAL, CKMB, CKMBINDEX, TROPONINI in the last 168 hours. BNP: BNP (last 3 results) Recent Labs    09/29/17 1215  BNP 80.2    ProBNP (last 3 results) No results for input(s): PROBNP in the last 8760 hours.  CBG: Recent Labs  Lab 10/08/17 2206  GLUCAP 131*       Signed:  Florencia Reasons MD, PhD  [facility] 10/10/2017, 9:39 AM

## 2017-10-11 ENCOUNTER — Telehealth: Payer: Self-pay | Admitting: *Deleted

## 2017-10-11 ENCOUNTER — Telehealth: Payer: Self-pay | Admitting: Internal Medicine

## 2017-10-11 NOTE — Telephone Encounter (Unsigned)
Copied from [place] [id number]. Topic: Quick Communication - See Telephone Encounter >> Oct 11, 2017 11:54 AM Percell Belt A wrote: CRM for notification. See Telephone encounter for: 10/29/2017.  With a very heavy Heart, Hospice called and said that patient expired this morning.

## 2017-10-11 NOTE — Telephone Encounter (Signed)
Pt was on TCM report admitted 10/08/17 ,recently diagnosed with pancreatic cancer, recent DVT/PE, diffuse hepatic metastasis presents to the emergency Department chief complaint generalized weakness no oral intake. while in the emergency department suffered right MCA. Pt D/C 10/10/17 home w/ hospice arrange -meds for comforts prescribed.Marland KitchenJohny Torres

## 2017-10-13 ENCOUNTER — Ambulatory Visit: Payer: Medicare Other

## 2017-10-13 ENCOUNTER — Other Ambulatory Visit: Payer: Medicare Other

## 2017-10-13 ENCOUNTER — Ambulatory Visit: Payer: Medicare Other | Admitting: Hematology & Oncology

## 2017-11-05 DEATH — deceased

## 2017-11-14 ENCOUNTER — Ambulatory Visit: Payer: Medicare Other | Admitting: Internal Medicine

## 2017-11-16 DIAGNOSIS — Z515 Encounter for palliative care: Secondary | ICD-10-CM

## 2017-12-26 IMAGING — MR MR HEAD W/O CM
9 of 10 series · 35 of 48 positions shown · non-contrast
Comparison: Comparison made with prior head CT from earlier the
same day.

CLINICAL DATA: Initial evaluation for acute right arm numbness,
evaluate for stroke. The

EXAM:
MRI HEAD WITHOUT CONTRAST
TECHNIQUE: Multiplanar, multiecho pulse sequences of the brain and surrounding
structures were obtained without intravenous contrast.

[Series 4: DWI · axial · 3.0mm · 0.94mm/px · z∈[-156,-24]mm · 9 of 89 slices shown (1 of 2)]
[im 1/89]
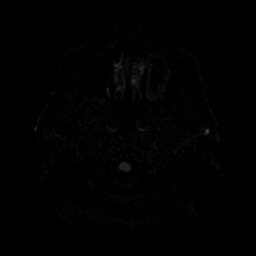
[im 12/89]
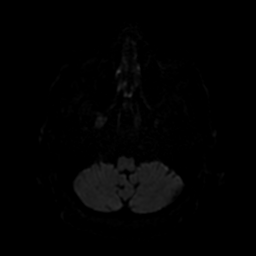
[im 23/89]
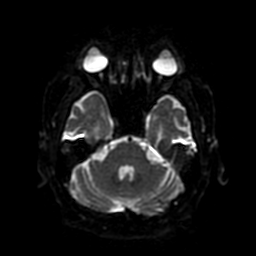
[im 34/89]
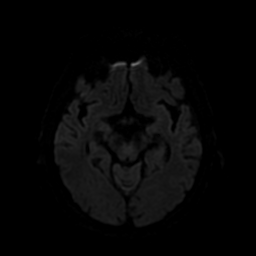
[im 45/89]
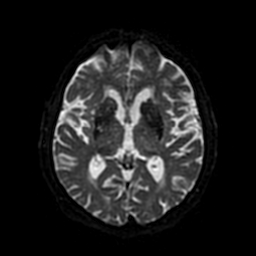
[im 56/89]
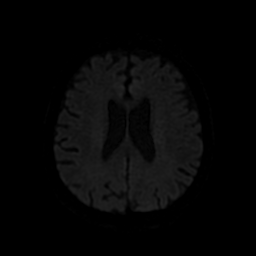
[im 67/89]
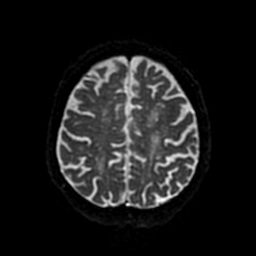
[im 78/89]
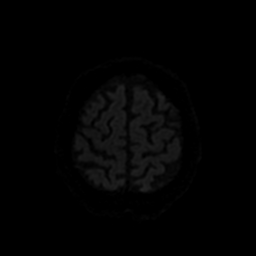
[im 89/89]
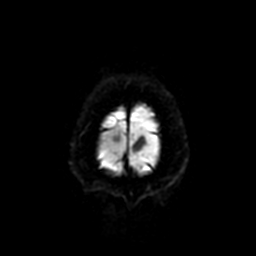

[Series 5: T2 · axial · 5.0mm · 0.47mm/px · z∈[-156,-24]mm · 2 of 23 slices shown (1 of 2)]
[im 1/23]
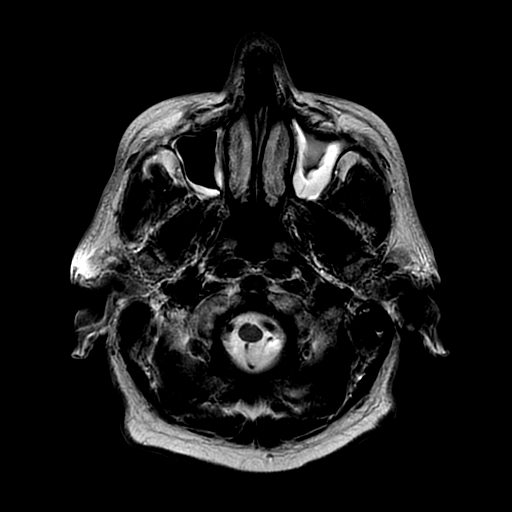
[im 23/23]
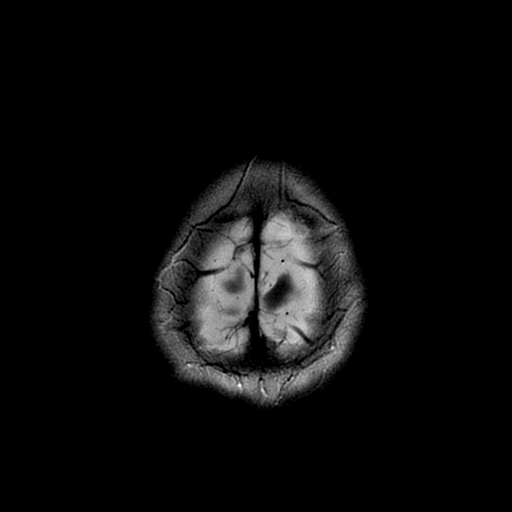

[Series 6: DWI · coronal · 4.0mm · 0.94mm/px · 7 of 64 slices shown (2 of 2)]
[im 1/64]
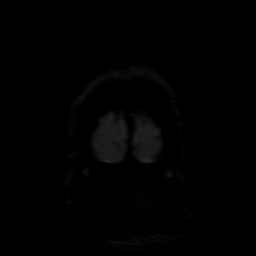
[im 11/64]
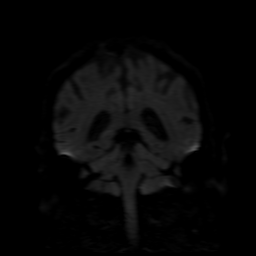
[im 22/64]
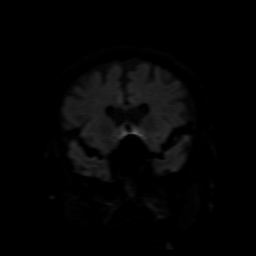
[im 32/64]
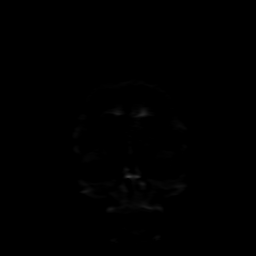
[im 43/64]
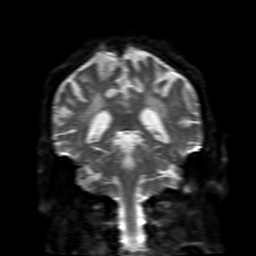
[im 53/64]
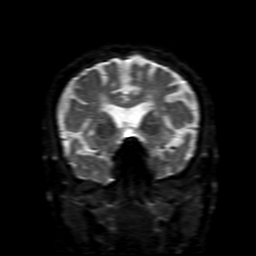
[im 64/64]
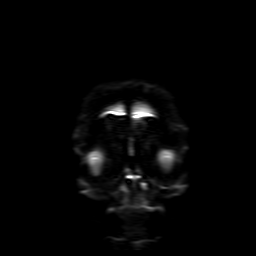

[Series 7: FLAIR · axial · 5.0mm · 0.47mm/px · z∈[-156,-24]mm · 2 of 23 slices shown (1 of 2)]
[im 1/23]
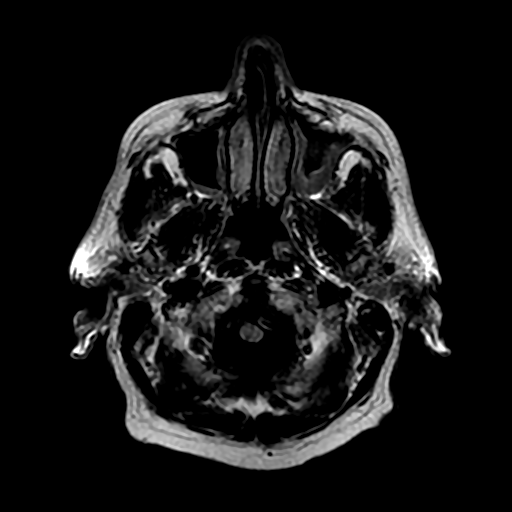
[im 23/23]
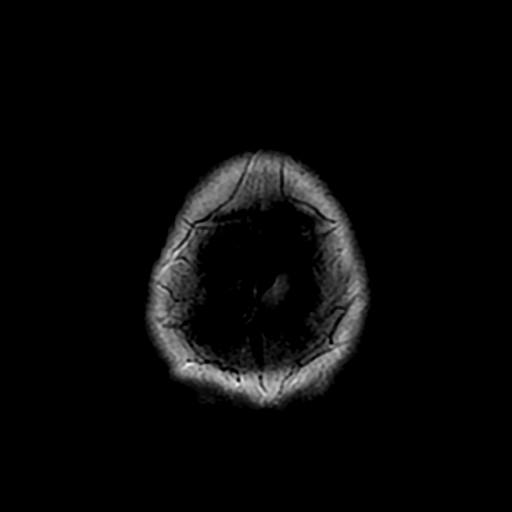

[Series 8: (person_name) · axial · 3.0mm · 0.47mm/px · 1 of 92 slices shown]
[im 1/92]
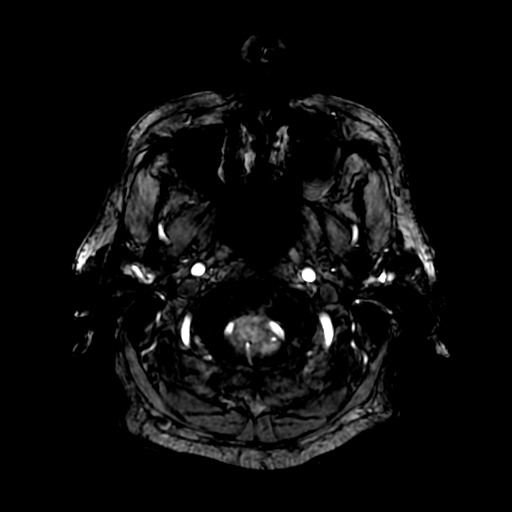

[Series 9: FLAIR · sagittal · 5.0mm · 0.47mm/px · 3 of 25 slices shown (2 of 2)]
[im 1/25]
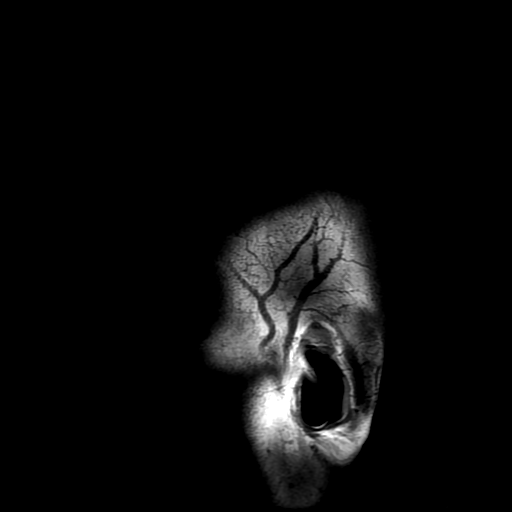
[im 13/25]
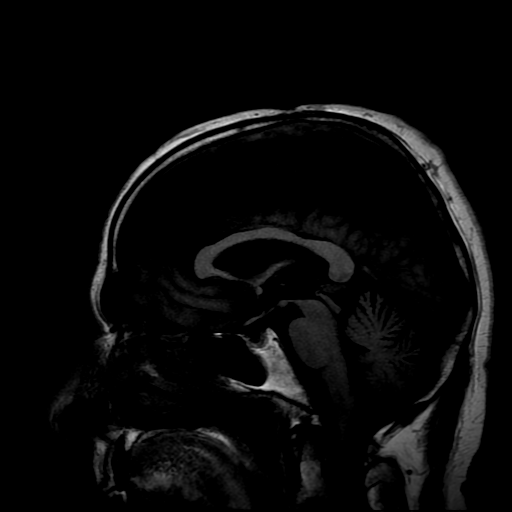
[im 25/25]
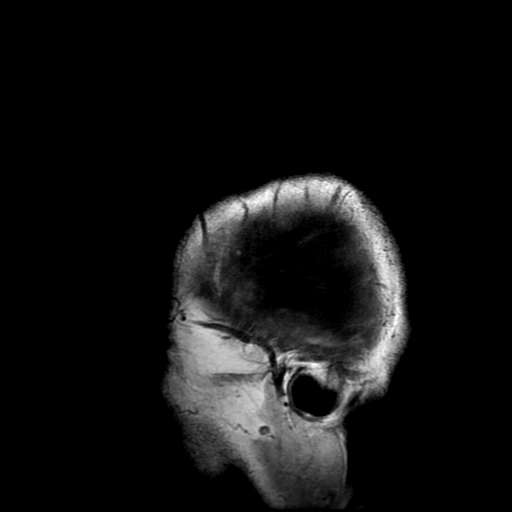

[Series 11: T2 · coronal · 5.0mm · 0.39mm/px · 3 of 27 slices shown (2 of 2)]
[im 1/27]
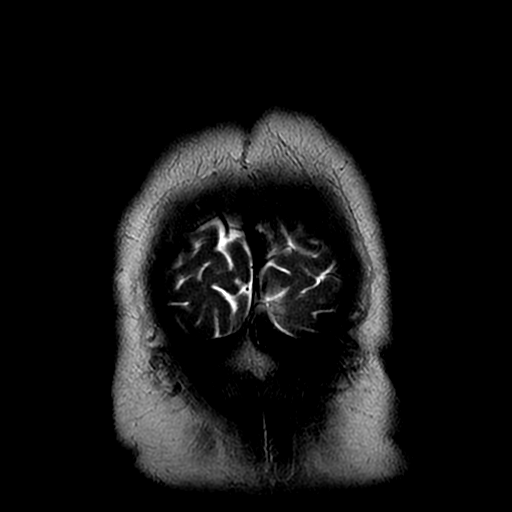
[im 14/27]
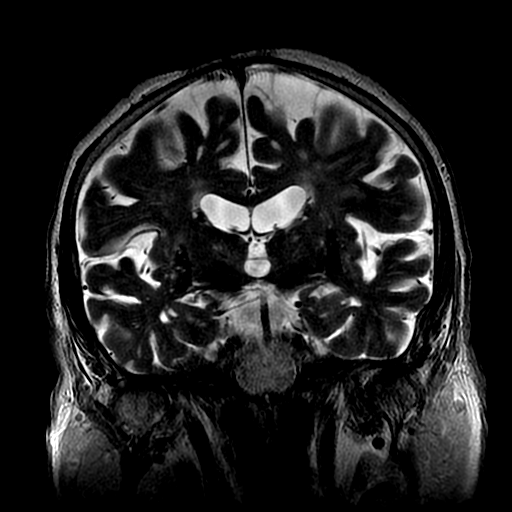
[im 27/27]
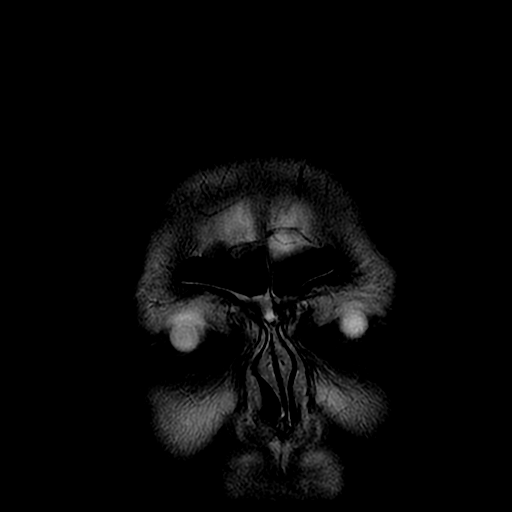

[Series 450: ADC · axial · 3.0mm · 0.94mm/px · z∈[-156,-24]mm · 5 of 45 slices shown (1 of 2)]
[im 1/45]
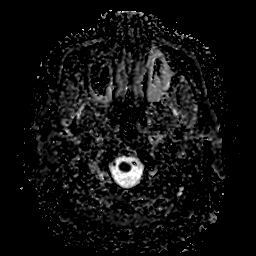
[im 12/45]
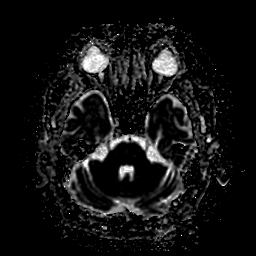
[im 23/45]
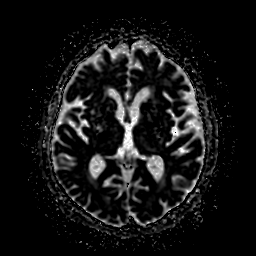
[im 34/45]
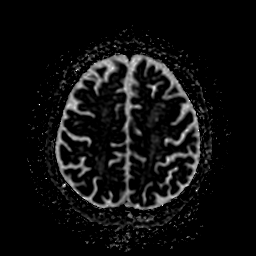
[im 45/45]
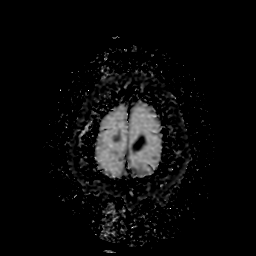

[Series 650: ADC · coronal · 4.0mm · 0.94mm/px · 3 of 32 slices shown (2 of 2)]
[im 1/32]
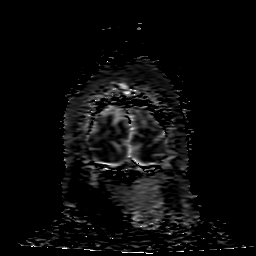
[im 16/32]
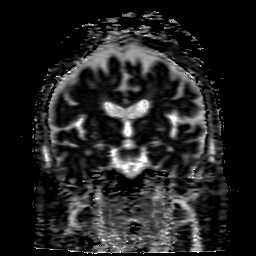
[im 32/32]
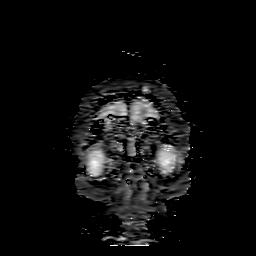

[35 of 48 positions shown; findings below may reference images not displayed]

FINDINGS: Brain: Diffuse prominence of the CSF containing spaces is compatible
with generalized cerebral atrophy, moderate in nature. Patchy and
confluent T2/FLAIR hyperintensity within the periventricular and
deep white matter both cerebral hemispheres most consistent with
chronic microvascular disease, also moderate in nature. Few
scattered superimposed small remote lacunar infarcts noted within
the bilateral basal ganglia.

There is a single punctate 4 mm focus of diffusion abnormality
within the lateral left thalamus (series 4, image 21), suspicious
for possible tiny acute/early subacute ischemic infarct.
Corresponding signal abnormality difficult to discern on ADC map
given small size. No evidence for associated hemorrhage. No other
evidence for acute or subacute ischemia. No made of a subcentimeter
focus of susceptibility artifact within the inferior left cerebellar
hemisphere, compatible with a small chronic microhemorrhage. This is
likely hypertensive in etiology. No other evidence for chronic
hemorrhage.

No mass lesion, midline shift or mass effect. No hydrocephalus. No
extra-axial fluid collection. Major dural sinuses are patent.

Pituitary gland and suprasellar region within normal limits.

Vascular: Major intracranial vascular flow voids are maintained.

Skull and upper cervical spine: Craniocervical junction normal.
Visualized upper cervical spine unremarkable. Bone marrow signal
intensity within normal limits. No scalp soft tissue abnormality.

Sinuses/Orbits: Globes and orbits within normal limits. Patient is
status post lens extraction bilaterally. Moderate mucosal thickening
throughout the paranasal sinuses, greatest within the ethmoidal air
cells and maxillary sinuses. No air-fluid level to suggest active
sinus infection. Small left mastoid effusion noted. Inner ear
structures grossly normal.
IMPRESSION: 1. Punctate 4 mm focus of diffusion abnormality within the left
thalamus, suspicious for possible tiny acute/ early subacute
ischemic infarct. No associated hemorrhage.
2. No other acute intracranial process identified.
3. Moderate age-related cerebral atrophy with chronic microvascular
ischemic disease.

## 2019-04-02 IMAGING — US IR US GUIDE VASC ACCESS RIGHT
1 series · 1 of 1 positions shown · non-contrast
Comparison: none

CLINICAL DATA: Pancreatic carcinoma, metastatic. Needs durable
venous access for chemotherapy regimen.
TECHNIQUE: The procedure, risks, benefits, and alternatives were explained to
the patient. Questions regarding the procedure were encouraged and
answered. The patient understands and consents to the procedure.

[Series 1: ir us guide vasc access right · 1 of 1 slices shown]
[im 1/1]
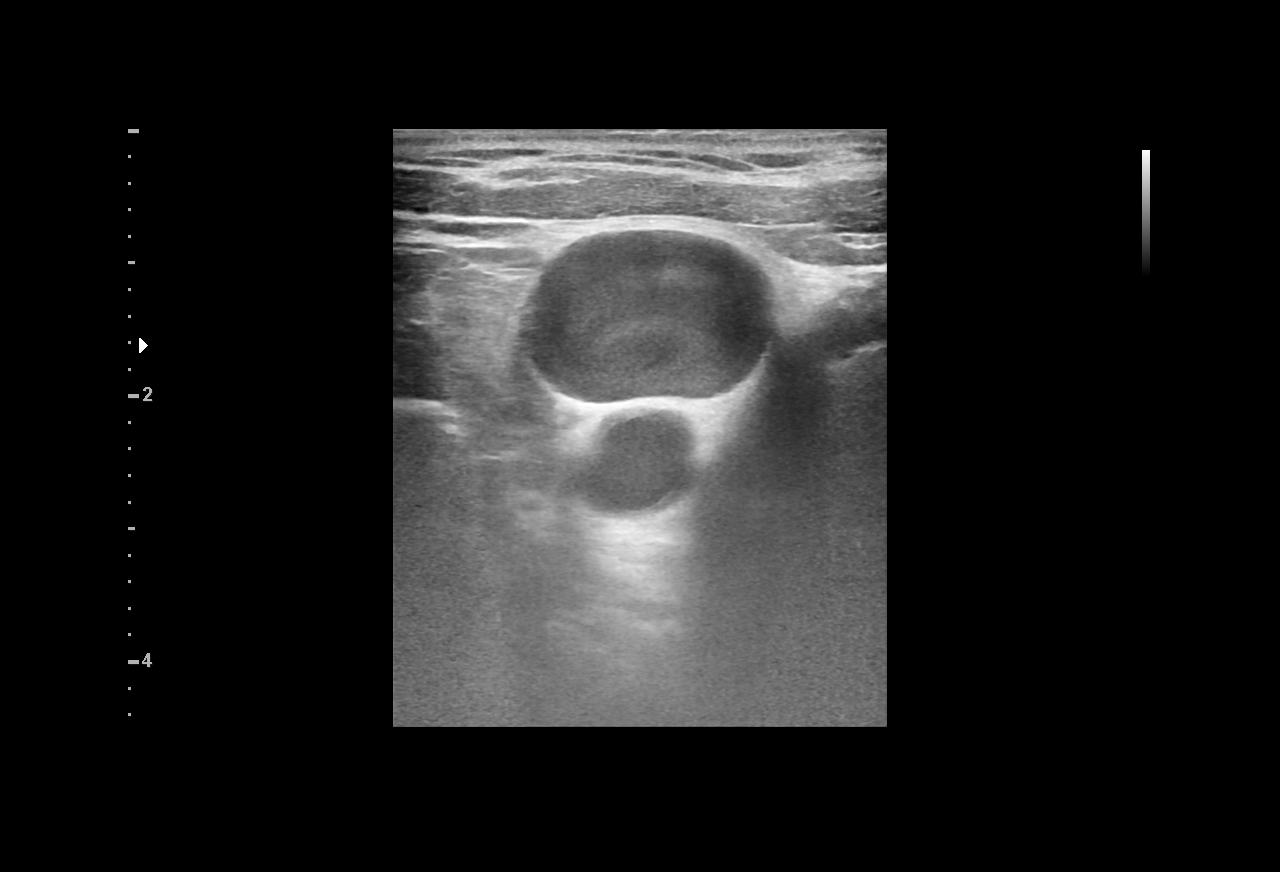

[1 of 1 positions shown; findings below may reference images not displayed]

EXAM:
TUNNELED PORT CATHETER PLACEMENT WITH ULTRASOUND AND FLUOROSCOPIC
GUIDANCE

FLUOROSCOPY TIME:  0.1 minute; 9.9 uLym2 DAP

ANESTHESIA/SEDATION:
Intravenous Fentanyl and Versed were administered as conscious
sedation during continuous monitoring of the patient's level of
consciousness and physiological / cardiorespiratory status by the
radiology RN, with a total moderate sedation time of 17 minutes.
As antibiotic prophylaxis, cefazolin 2 g was ordered pre-procedure
and administered intravenously within one hour of incision. Patency
of the right IJ vein was confirmed with ultrasound with image
documentation. An appropriate skin site was determined. Skin site
was marked. Region was prepped using maximum barrier technique
including cap and mask, sterile gown, sterile gloves, large sterile
sheet, and Chlorhexidine as cutaneous antisepsis. The region was
infiltrated locally with 1% lidocaine. Under real-time ultrasound
guidance, the right IJ vein was accessed with a 21 gauge
micropuncture needle; the needle tip within the vein was confirmed
with ultrasound image documentation. Needle was exchanged over a 018
guidewire for transitional dilator which allowed passage of the
[place] wire into the IVC. Over this, the transitional dilator was
exchanged for a 5 [place] MPA catheter. A small incision was made on
the right anterior chest wall and a subcutaneous pocket fashioned.
The power-injectable port was positioned and its catheter tunneled
to the right IJ dermatotomy site. The MPA catheter was exchanged
over an Amplatz wire for a peel-away sheath, through which the port
catheter, which had been trimmed to the appropriate length, was
advanced and positioned under fluoroscopy with its tip at the
cavoatrial junction. Spot chest radiograph confirms good catheter
position and no pneumothorax. The pocket was closed with deep
interrupted and subcuticular continuous 3-0 Monocryl sutures. The
port was flushed per protocol. The incisions were covered with
Dermabond then covered with a sterile dressing.

COMPLICATIONS:
COMPLICATIONS
None immediate
IMPRESSION: Technically successful right IJ power-injectable port catheter
placement. Ready for routine use.
